# Patient Record
Sex: Female | Born: 1970 | Race: Black or African American | Hispanic: No | Marital: Single | Smoking: Former smoker
Health system: Southern US, Community
[De-identification: ages and names within clinical notes are randomized; demographics above are authoritative.]

## PROBLEM LIST (undated history)

## (undated) DIAGNOSIS — R109 Unspecified abdominal pain: Secondary | ICD-10-CM

## (undated) DIAGNOSIS — D649 Anemia, unspecified: Secondary | ICD-10-CM

## (undated) DIAGNOSIS — I10 Essential (primary) hypertension: Secondary | ICD-10-CM

## (undated) DIAGNOSIS — M1990 Unspecified osteoarthritis, unspecified site: Secondary | ICD-10-CM

## (undated) DIAGNOSIS — G4730 Sleep apnea, unspecified: Secondary | ICD-10-CM

## (undated) DIAGNOSIS — M62830 Muscle spasm of back: Secondary | ICD-10-CM

## (undated) DIAGNOSIS — K635 Polyp of colon: Secondary | ICD-10-CM

## (undated) DIAGNOSIS — Z9889 Other specified postprocedural states: Secondary | ICD-10-CM

## (undated) DIAGNOSIS — Z9289 Personal history of other medical treatment: Secondary | ICD-10-CM

## (undated) DIAGNOSIS — R7303 Prediabetes: Secondary | ICD-10-CM

## (undated) DIAGNOSIS — R7302 Impaired glucose tolerance (oral): Secondary | ICD-10-CM

## (undated) HISTORY — DX: Prediabetes: R73.03

## (undated) HISTORY — DX: Personal history of other medical treatment: Z92.89

## (undated) HISTORY — DX: Polyp of colon: K63.5

## (undated) HISTORY — DX: Unspecified osteoarthritis, unspecified site: M19.90

## (undated) HISTORY — DX: Anemia, unspecified: D64.9

## (undated) HISTORY — DX: Sleep apnea, unspecified: G47.30

## (undated) HISTORY — DX: Muscle spasm of back: M62.830

## (undated) HISTORY — DX: Unspecified abdominal pain: R10.9

## (undated) HISTORY — DX: Essential (primary) hypertension: I10

## (undated) HISTORY — DX: Impaired glucose tolerance (oral): R73.02

## (undated) HISTORY — PX: SALPINGECTOMY: SHX328

## (undated) HISTORY — DX: Other specified postprocedural states: Z98.890

---

## 1999-10-03 ENCOUNTER — Other Ambulatory Visit: Admission: RE | Admit: 1999-10-03 | Discharge: 1999-10-03 | Payer: Self-pay | Admitting: Family Medicine

## 2000-02-06 ENCOUNTER — Emergency Department (HOSPITAL_COMMUNITY): Admission: EM | Admit: 2000-02-06 | Discharge: 2000-02-06 | Payer: Self-pay | Admitting: *Deleted

## 2001-01-29 ENCOUNTER — Emergency Department (HOSPITAL_COMMUNITY): Admission: EM | Admit: 2001-01-29 | Discharge: 2001-01-29 | Payer: Self-pay | Admitting: Emergency Medicine

## 2001-02-17 ENCOUNTER — Other Ambulatory Visit: Admission: RE | Admit: 2001-02-17 | Discharge: 2001-02-17 | Payer: Self-pay | Admitting: Family Medicine

## 2001-07-01 ENCOUNTER — Emergency Department (HOSPITAL_COMMUNITY): Admission: EM | Admit: 2001-07-01 | Discharge: 2001-07-01 | Payer: Self-pay | Admitting: Emergency Medicine

## 2001-07-01 ENCOUNTER — Encounter: Payer: Self-pay | Admitting: Emergency Medicine

## 2001-07-30 ENCOUNTER — Emergency Department (HOSPITAL_COMMUNITY): Admission: EM | Admit: 2001-07-30 | Discharge: 2001-07-30 | Payer: Self-pay | Admitting: Emergency Medicine

## 2001-07-30 ENCOUNTER — Encounter: Payer: Self-pay | Admitting: Emergency Medicine

## 2001-09-09 ENCOUNTER — Ambulatory Visit (HOSPITAL_COMMUNITY): Admission: RE | Admit: 2001-09-09 | Discharge: 2001-09-09 | Payer: Self-pay | Admitting: Family Medicine

## 2001-10-20 ENCOUNTER — Emergency Department (HOSPITAL_COMMUNITY): Admission: EM | Admit: 2001-10-20 | Discharge: 2001-10-20 | Payer: Self-pay | Admitting: Emergency Medicine

## 2002-11-23 ENCOUNTER — Emergency Department (HOSPITAL_COMMUNITY): Admission: EM | Admit: 2002-11-23 | Discharge: 2002-11-23 | Payer: Self-pay

## 2002-11-29 ENCOUNTER — Encounter: Payer: Self-pay | Admitting: Emergency Medicine

## 2002-11-29 ENCOUNTER — Emergency Department (HOSPITAL_COMMUNITY): Admission: EM | Admit: 2002-11-29 | Discharge: 2002-11-29 | Payer: Self-pay | Admitting: *Deleted

## 2004-04-06 ENCOUNTER — Emergency Department (HOSPITAL_COMMUNITY): Admission: EM | Admit: 2004-04-06 | Discharge: 2004-04-06 | Payer: Self-pay | Admitting: Emergency Medicine

## 2004-11-29 ENCOUNTER — Emergency Department (HOSPITAL_COMMUNITY): Admission: EM | Admit: 2004-11-29 | Discharge: 2004-11-29 | Payer: Self-pay | Admitting: Emergency Medicine

## 2004-11-29 IMAGING — CR DG CHEST 2V
2 series · 2 of 2 positions shown · non-contrast
Comparison: None available.

CLINICAL DATA: Chest pain. 
 CHEST - 2 VIEWS:

[view not recorded (1 of 2)]
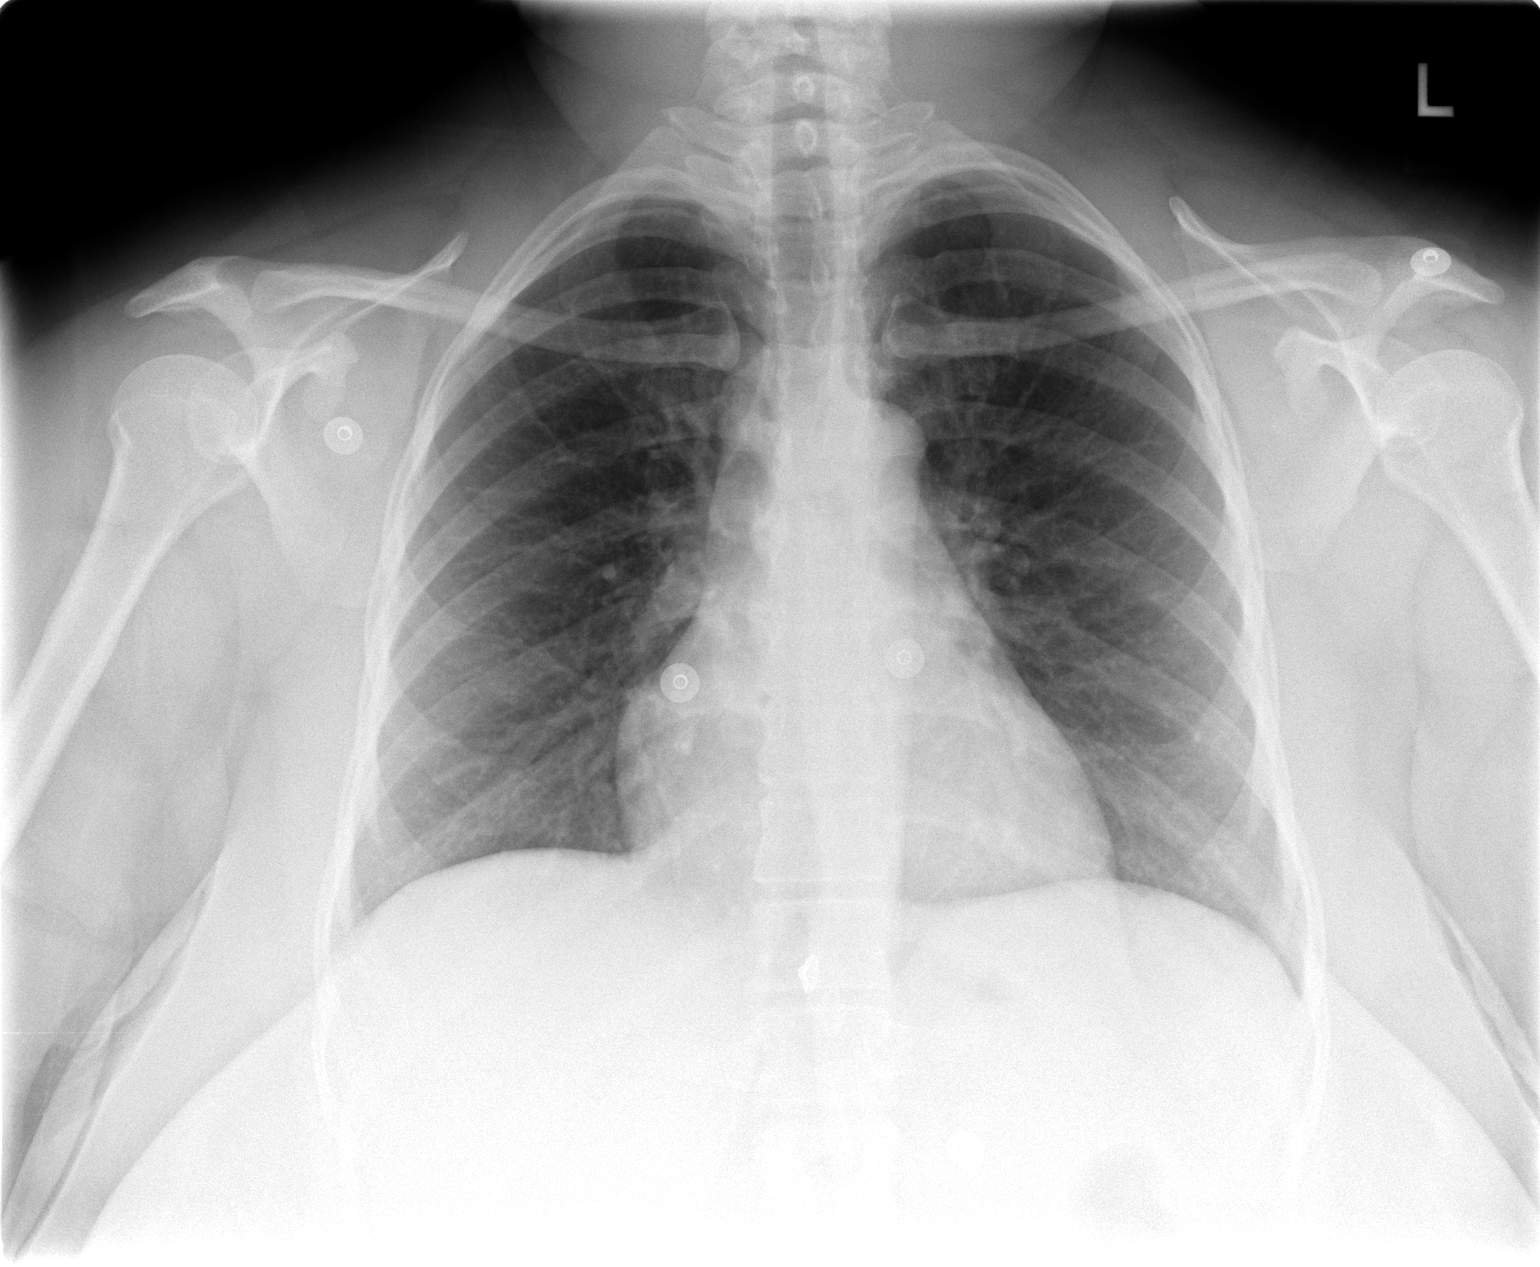

[view not recorded (2 of 2)]
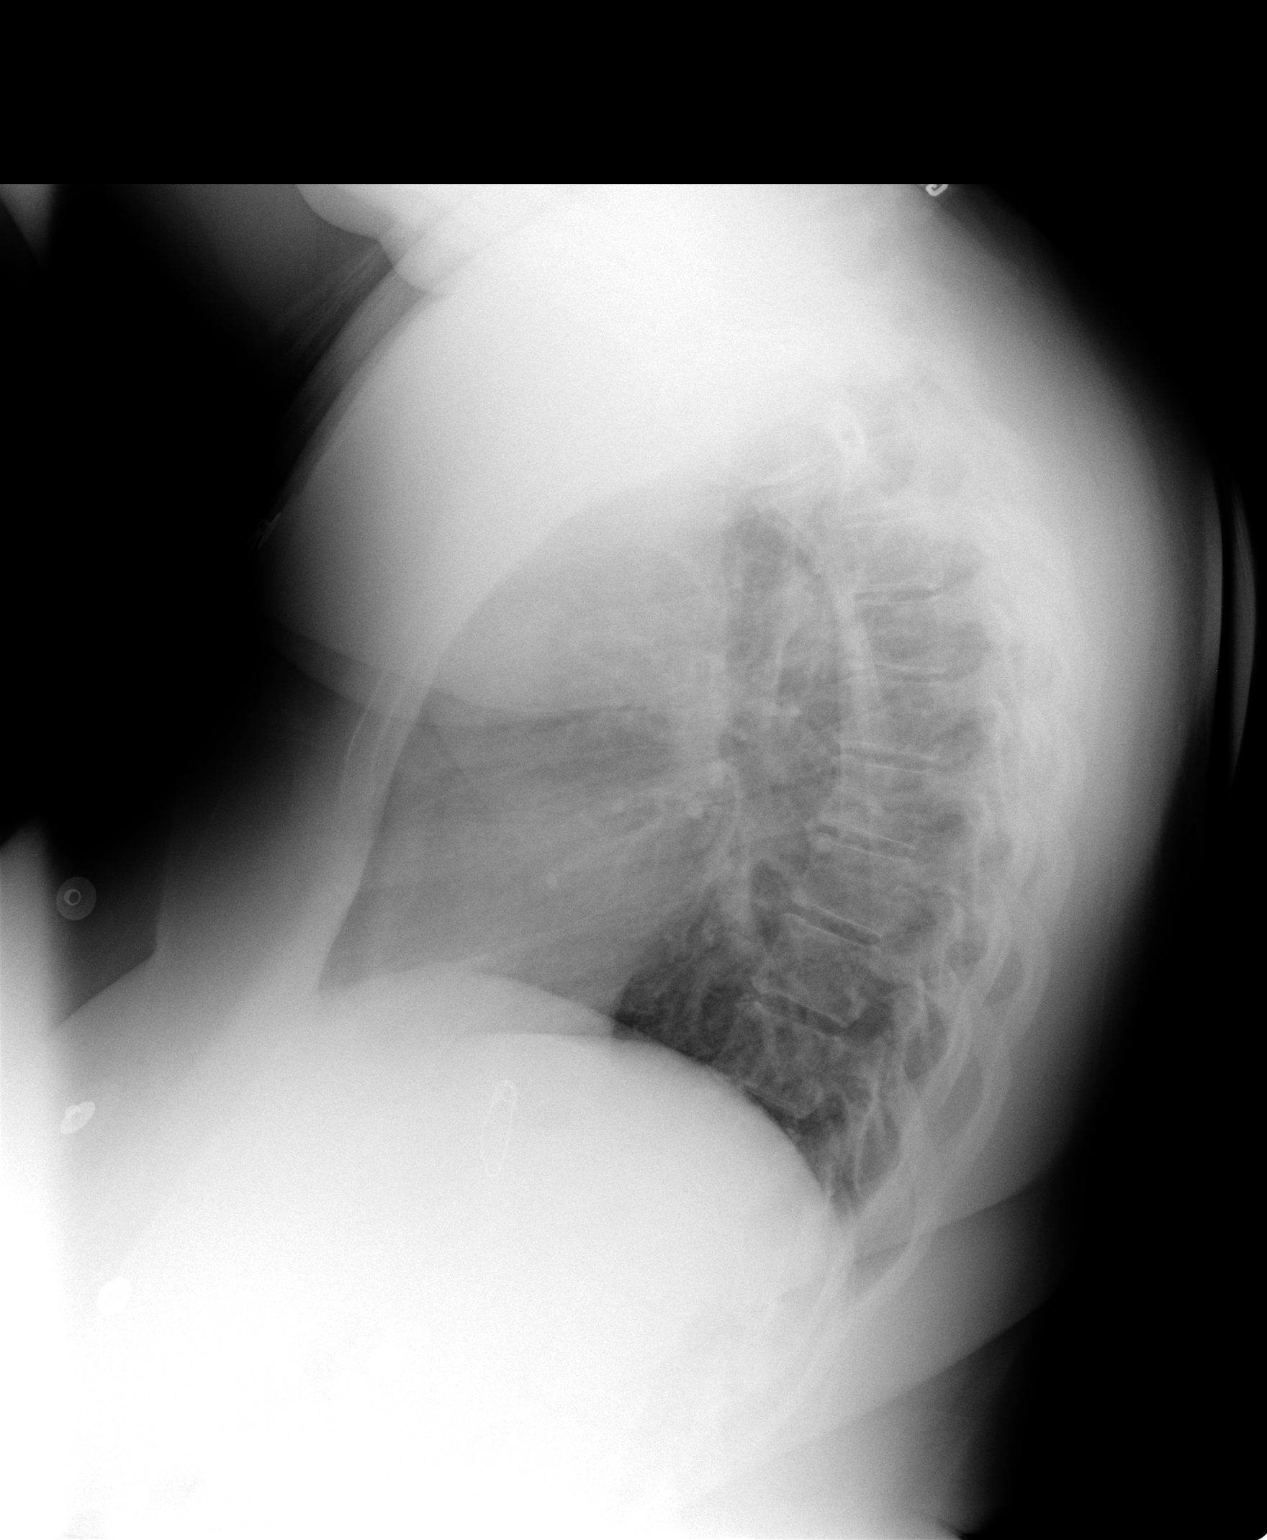

[2 of 2 positions shown; findings below may reference images not displayed]

Upper limits normal heart size is noted.  Lungs are clear.  No pleural effusions or pneumothorax.  Mild thoracic kyphosis noted.
IMPRESSION: Upper limits normal heart size without evidence of acute cardiopulmonary disease.

## 2005-03-17 IMAGING — CR DG CHEST 2V
2 series · 2 of 2 positions shown · non-contrast
Comparison: [DATE].

CLINICAL DATA: Emergency Department patient with chest pain for a couple of days.  Hypertension.  Previous smoker.  Congestion.
 CHEST - 2 VIEWS:

[w chest pa]
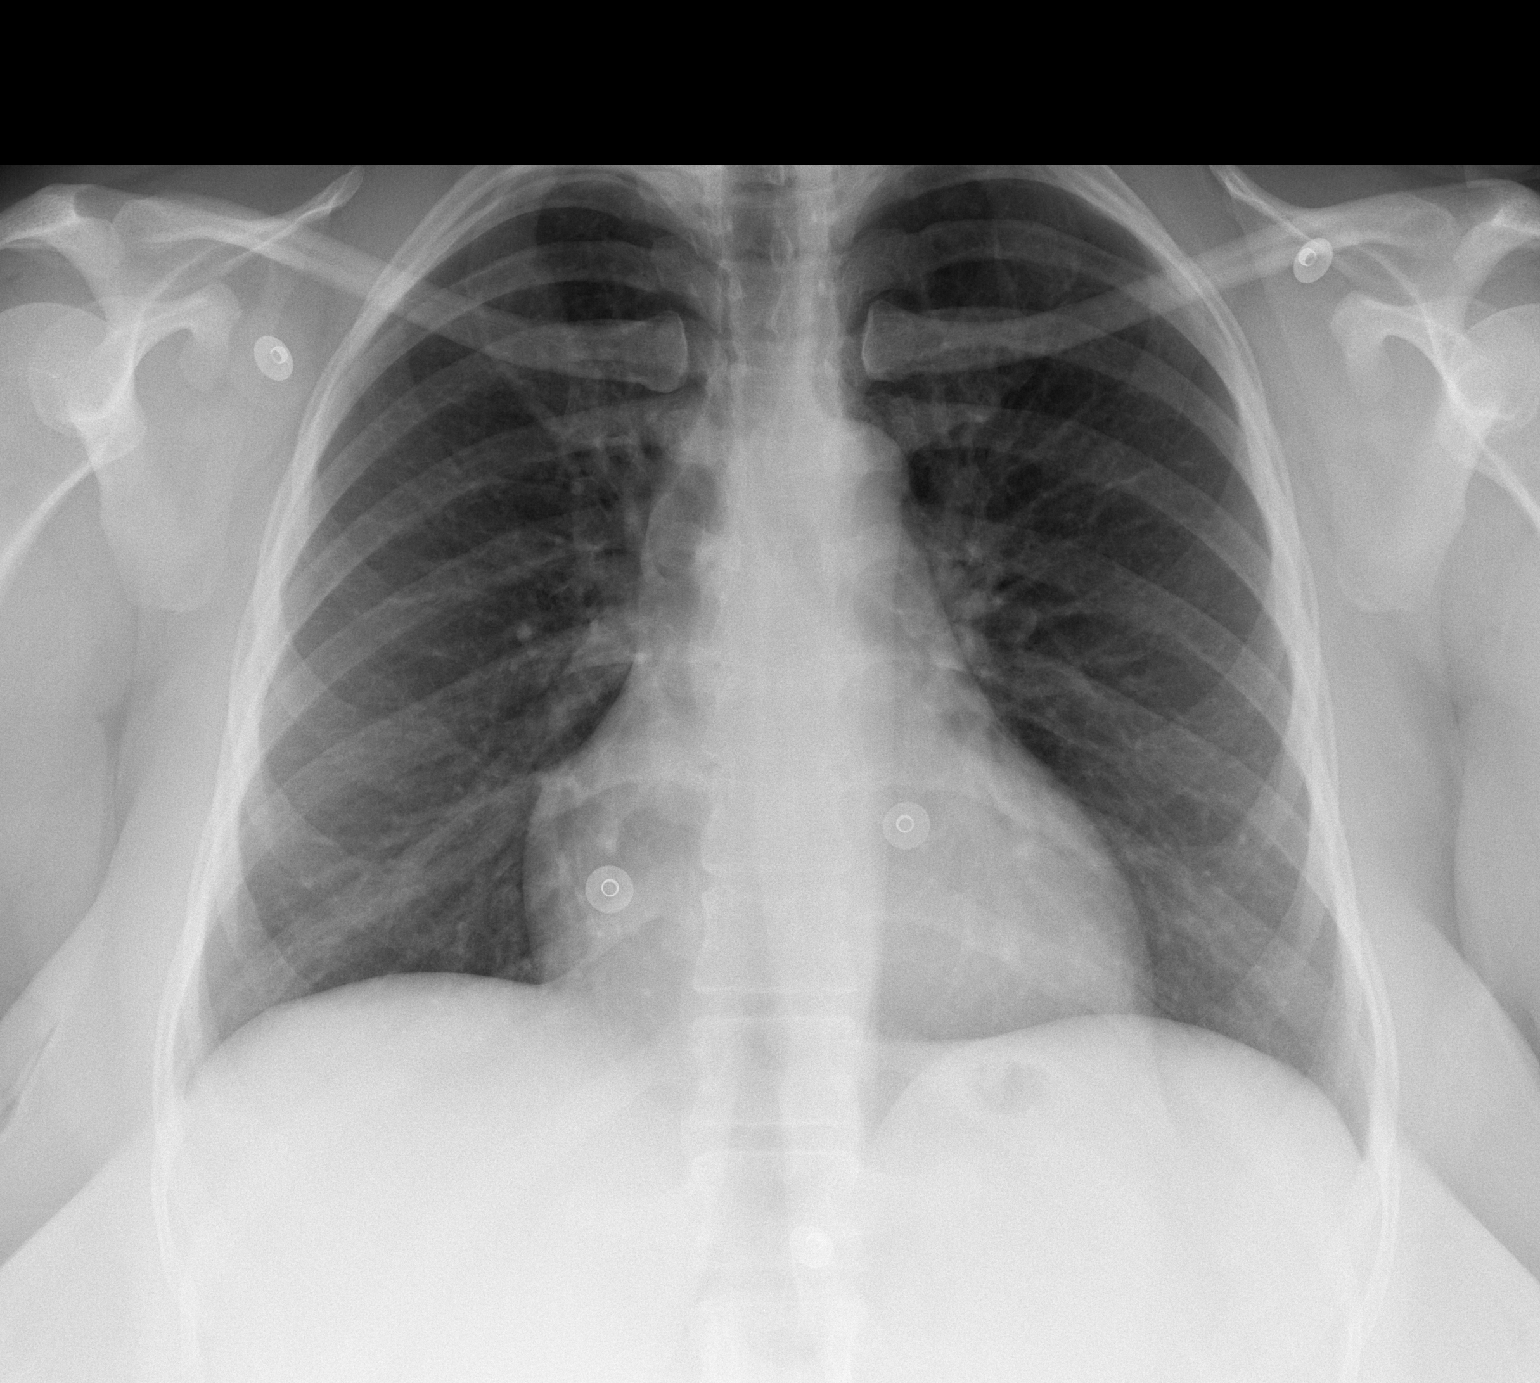

[w chest lat]
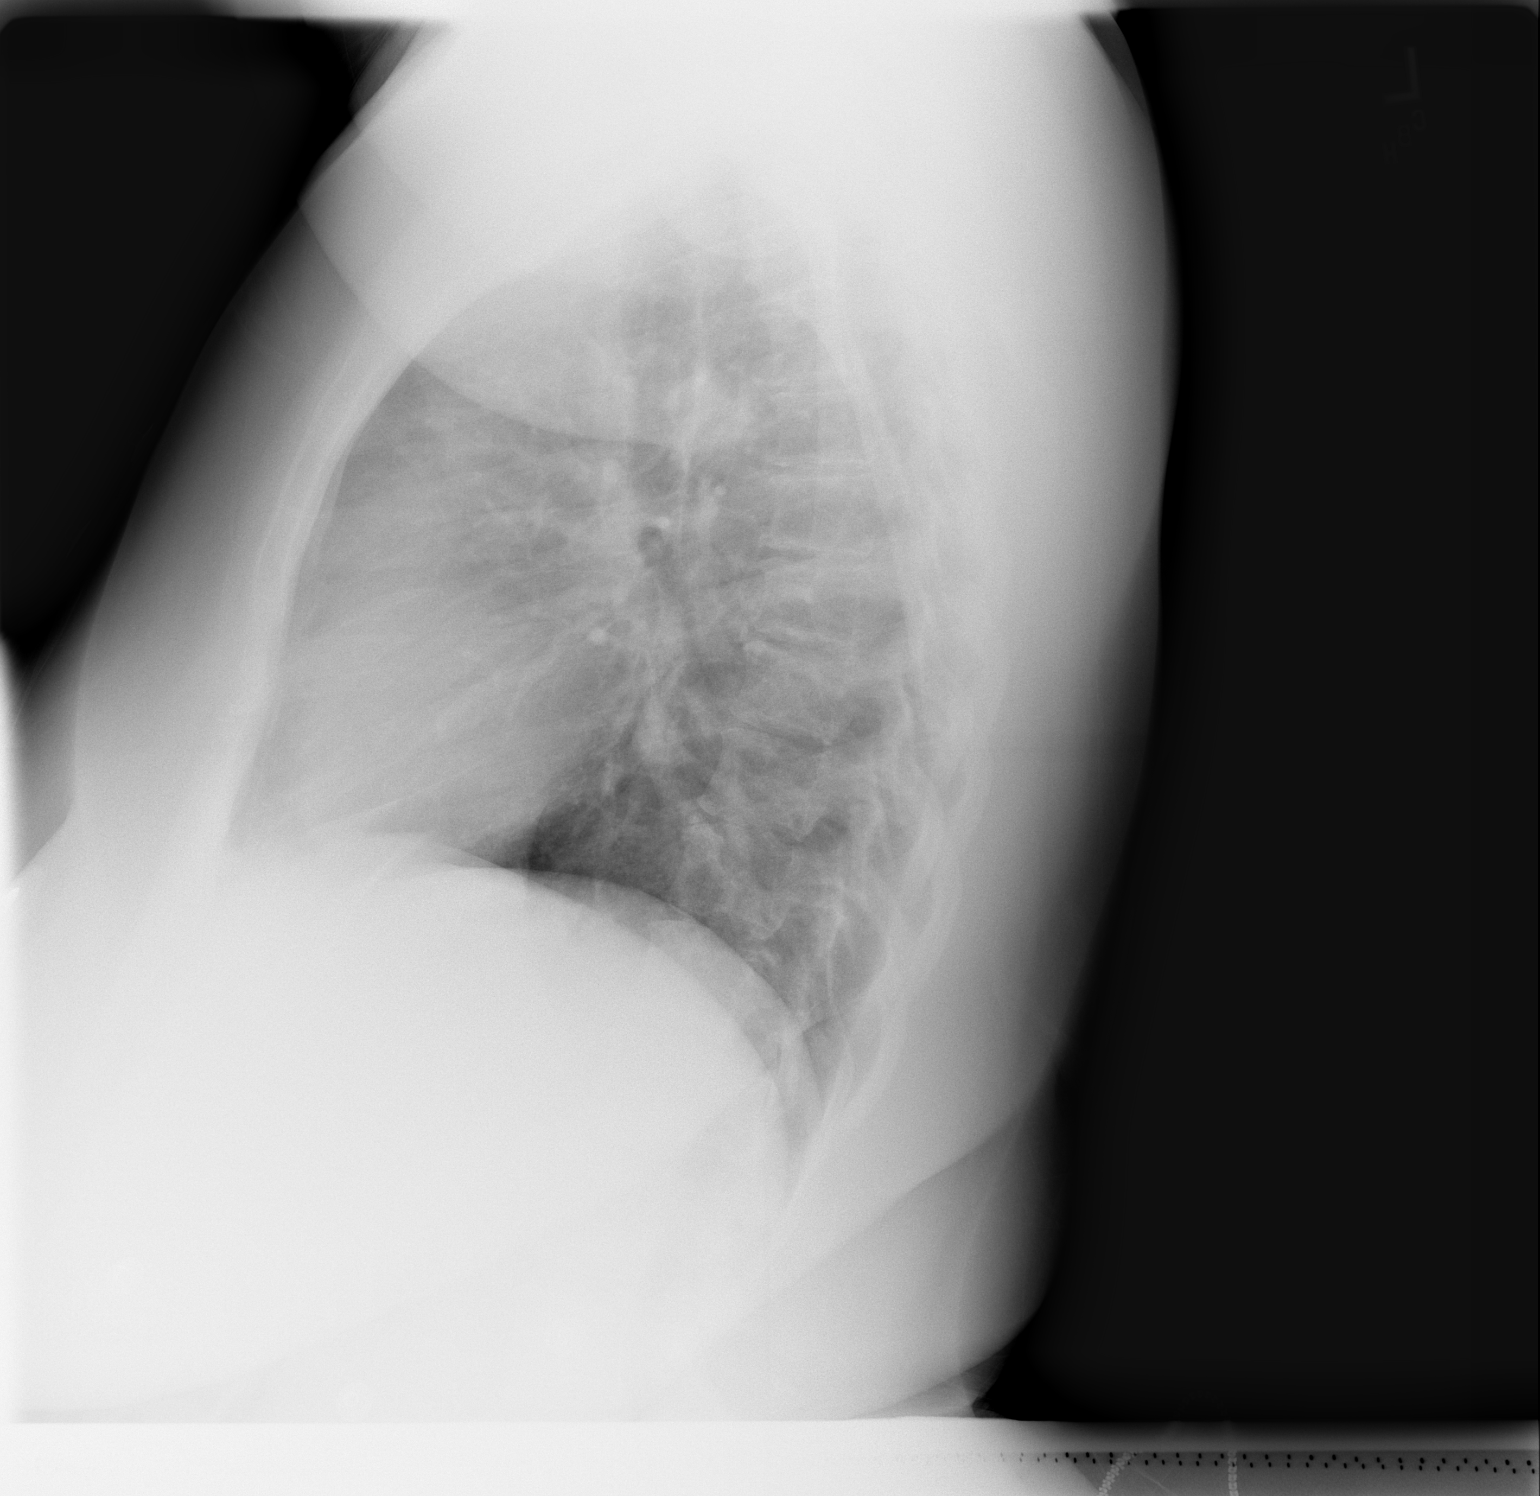

[2 of 2 positions shown; findings below may reference images not displayed]

FINDINGS: The cardiac size is upper normal.  There is no pulmonary vascular congestion or active lung process.  Intact bony thorax.
IMPRESSION: Minimal prominence of cardiac size.  No acute pulmonary abnormality.  No congestive heart failure.

## 2005-04-12 ENCOUNTER — Encounter (INDEPENDENT_AMBULATORY_CARE_PROVIDER_SITE_OTHER): Payer: Self-pay | Admitting: *Deleted

## 2005-04-12 LAB — CONVERTED CEMR LAB

## 2005-04-23 ENCOUNTER — Encounter: Payer: Self-pay | Admitting: Family Medicine

## 2005-04-23 ENCOUNTER — Ambulatory Visit (HOSPITAL_COMMUNITY): Admission: RE | Admit: 2005-04-23 | Discharge: 2005-04-23 | Payer: Self-pay | Admitting: Family Medicine

## 2005-04-23 ENCOUNTER — Ambulatory Visit: Payer: Self-pay | Admitting: Family Medicine

## 2005-05-28 ENCOUNTER — Ambulatory Visit: Payer: Self-pay | Admitting: Family Medicine

## 2005-06-24 ENCOUNTER — Emergency Department (HOSPITAL_COMMUNITY): Admission: EM | Admit: 2005-06-24 | Discharge: 2005-06-24 | Payer: Self-pay | Admitting: Emergency Medicine

## 2005-06-24 IMAGING — CR DG HAND COMPLETE 3+V*L*
3 series · 3 of 3 positions shown · non-contrast
Comparison: none

CLINICAL DATA: Trauma.  Pain over the third to fifth metacarpals. 
 LEFT HAND ? 3 VIEWS:
 There is no evidence of fracture or dislocation.  There is no evidence of arthropathy or other focal bone abnormality.  Soft tissues are unremarkable.

[x hand pa left]
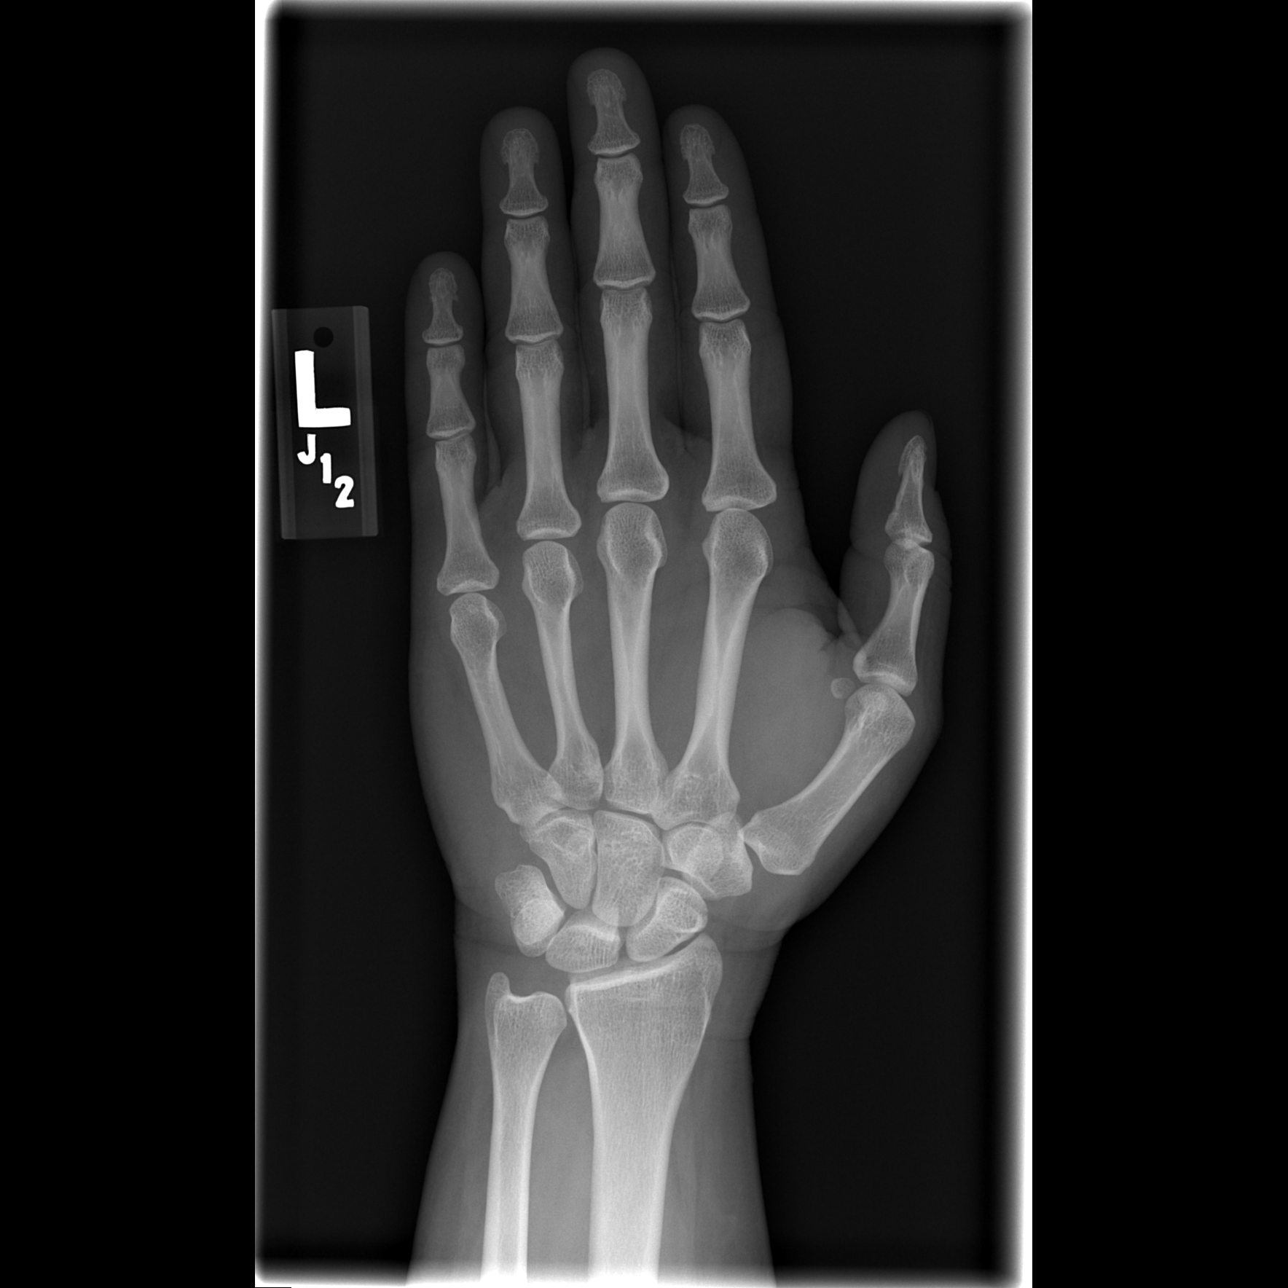

[x hand oblique left]
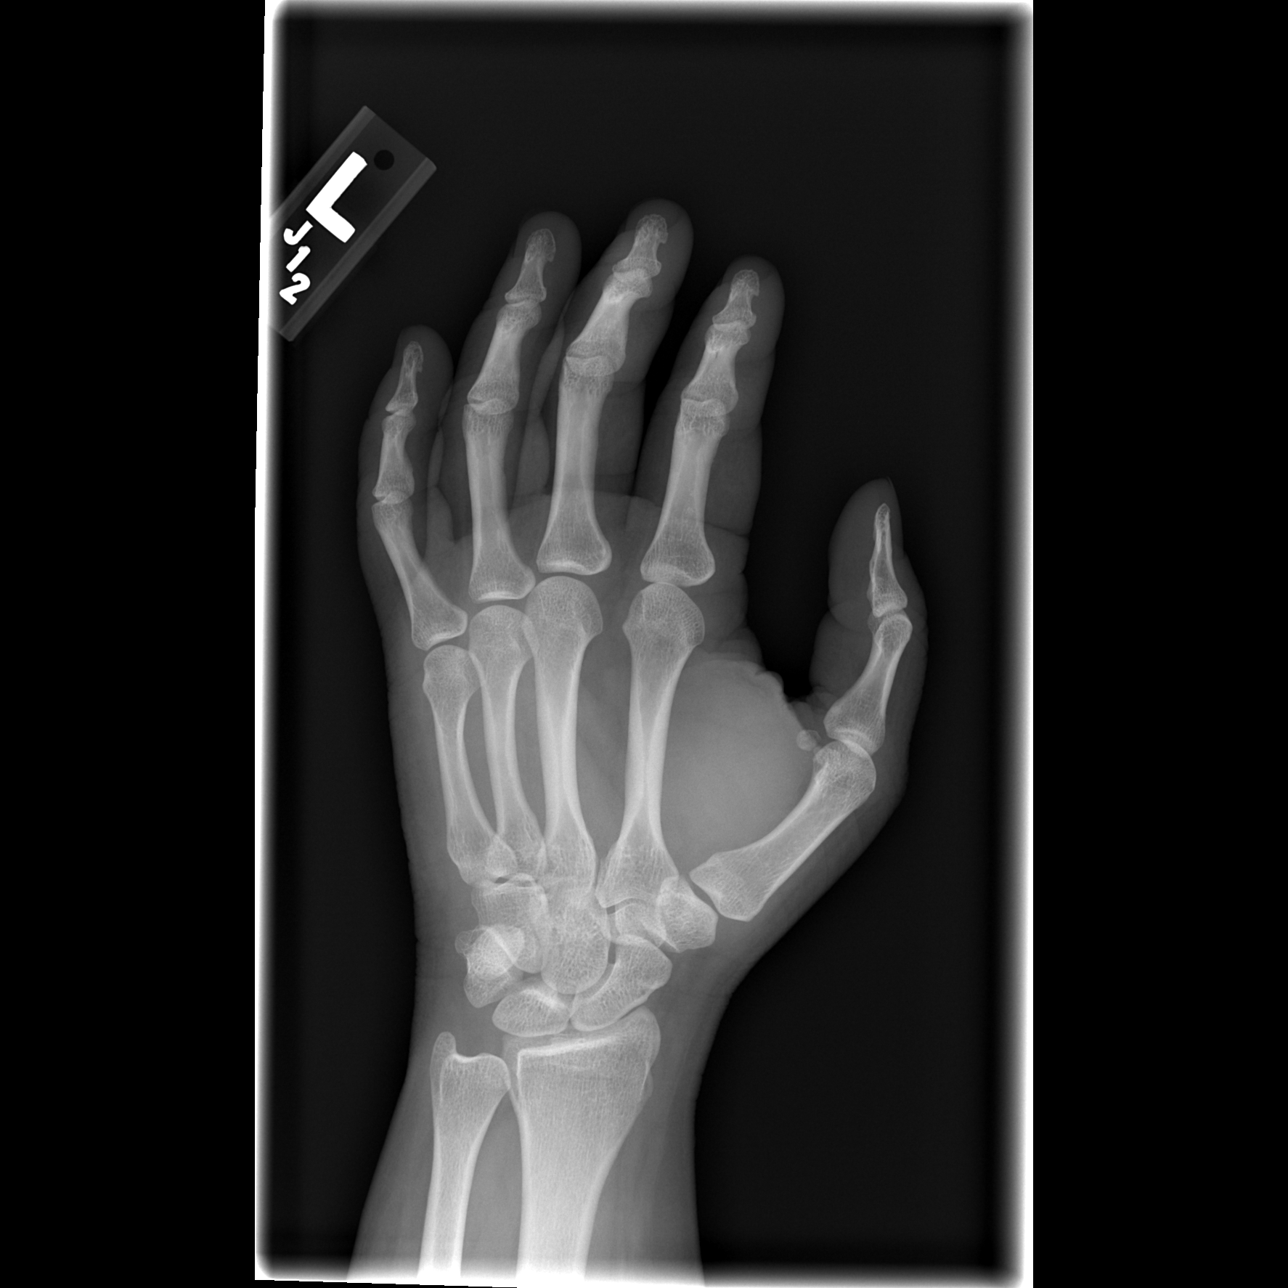

[x hand lat left]
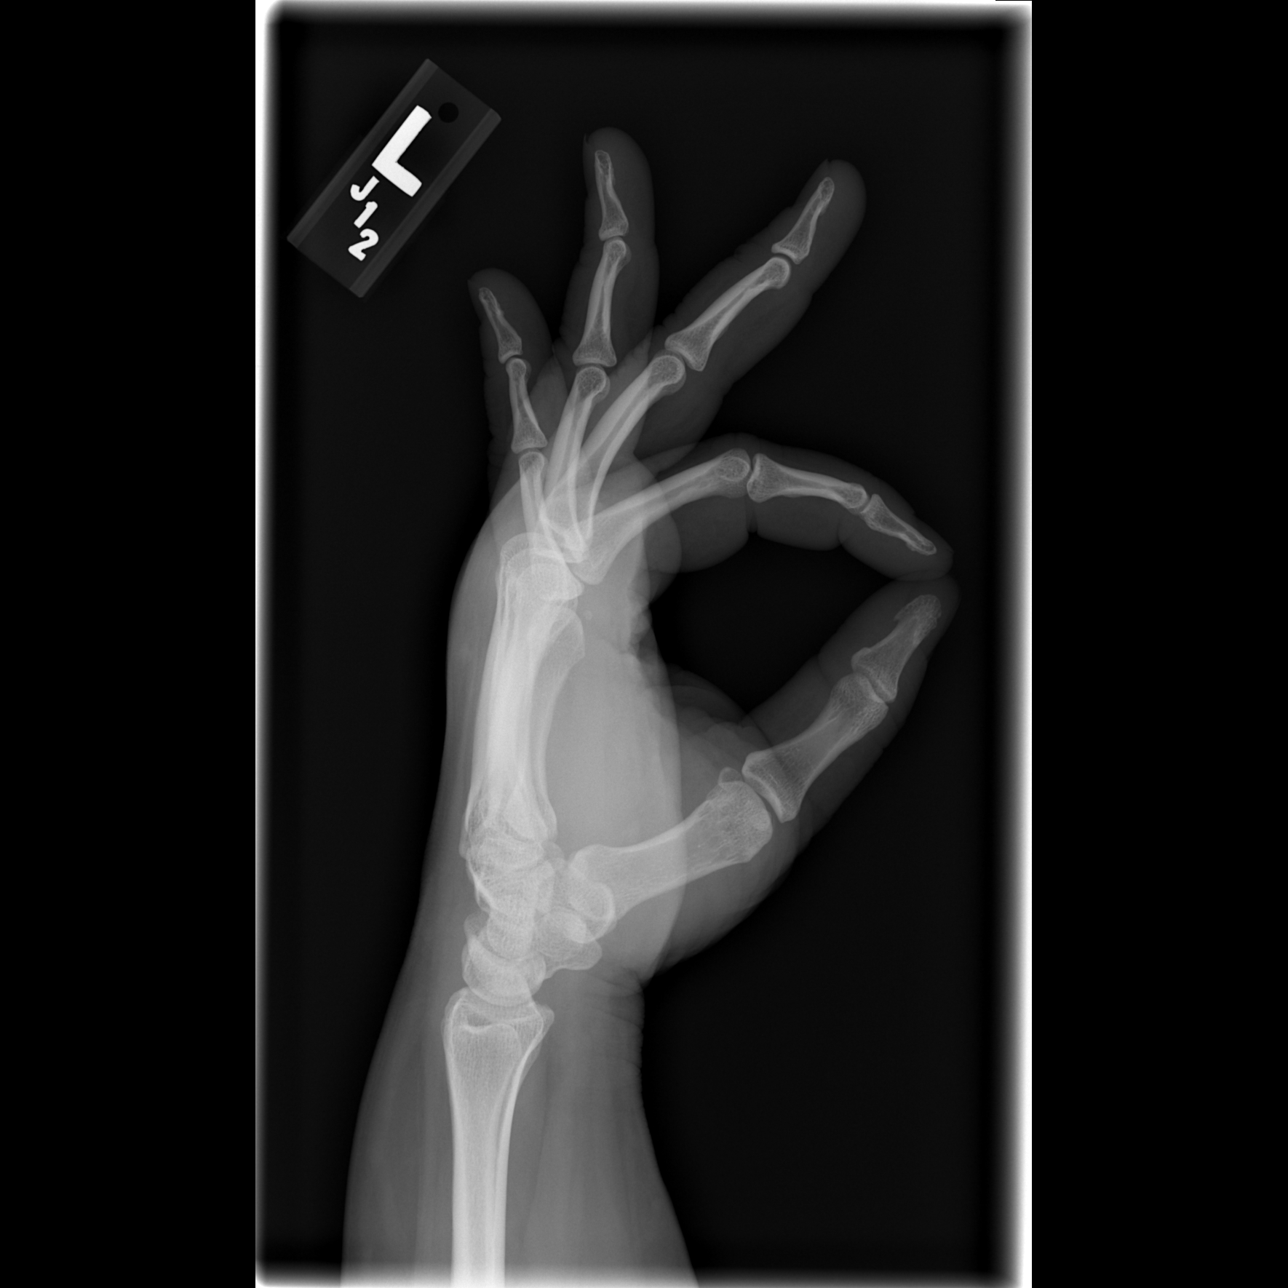

[3 of 3 positions shown; findings below may reference images not displayed]

IMPRESSION: Negative.

## 2005-07-30 ENCOUNTER — Ambulatory Visit: Payer: Self-pay | Admitting: Family Medicine

## 2005-08-02 ENCOUNTER — Emergency Department (HOSPITAL_COMMUNITY): Admission: EM | Admit: 2005-08-02 | Discharge: 2005-08-02 | Payer: Self-pay | Admitting: Emergency Medicine

## 2005-08-02 IMAGING — CR DG CHEST 2V
2 series · 2 of 2 positions shown · non-contrast
Comparison: [DATE].

CLINICAL DATA: Chest tightness/shortness of breath.
 CHEST ? 2 VIEW:

[view not recorded (1 of 2)]
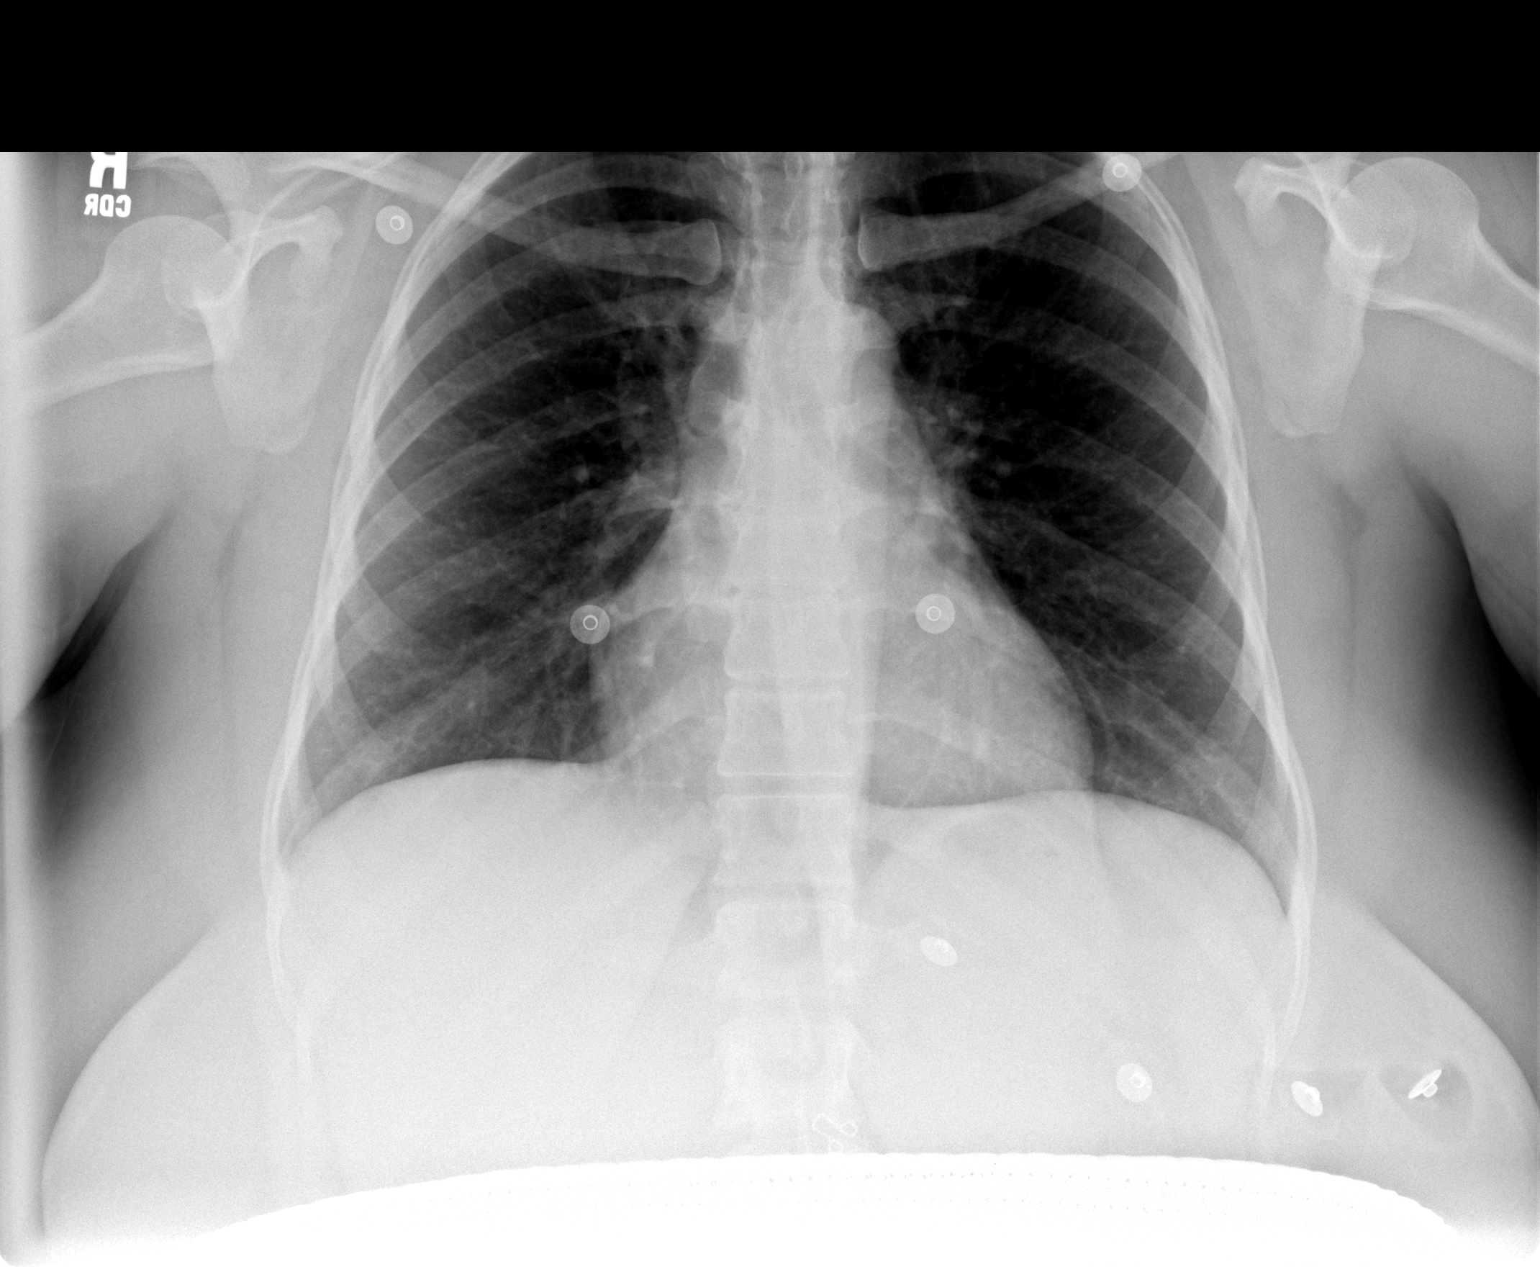

[view not recorded (2 of 2)]
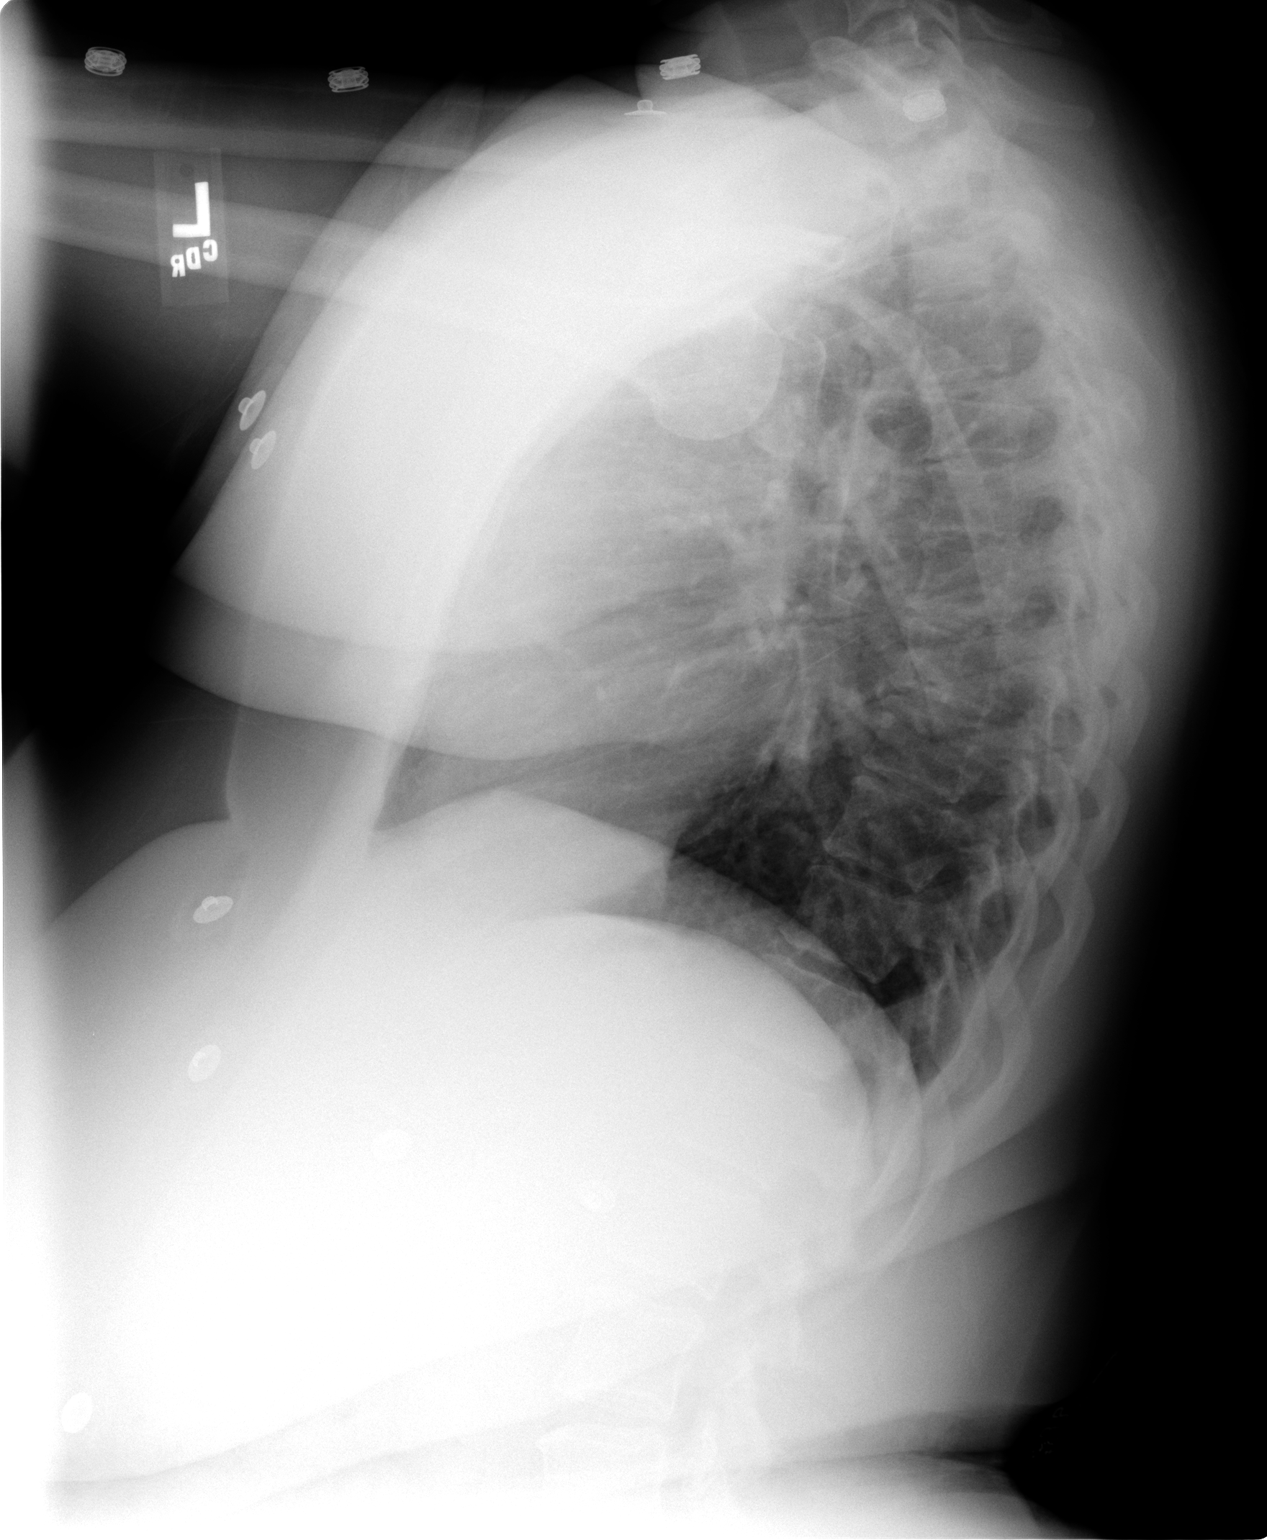

[2 of 2 positions shown; findings below may reference images not displayed]

FINDINGS: Abdomen and pelvis were shielded.
 The heart size and mediastinal contours are within normal limits.  Both lungs are clear.  The visualized skeletal structures are unremarkable.
IMPRESSION: No active cardiopulmonary disease.

## 2006-06-19 ENCOUNTER — Emergency Department (HOSPITAL_COMMUNITY): Admission: EM | Admit: 2006-06-19 | Discharge: 2006-06-19 | Payer: Self-pay | Admitting: Emergency Medicine

## 2006-06-23 ENCOUNTER — Ambulatory Visit: Payer: Self-pay | Admitting: Sports Medicine

## 2006-07-11 ENCOUNTER — Ambulatory Visit: Payer: Self-pay | Admitting: Family Medicine

## 2006-08-12 DIAGNOSIS — Z9289 Personal history of other medical treatment: Secondary | ICD-10-CM

## 2006-08-12 HISTORY — DX: Personal history of other medical treatment: Z92.89

## 2006-08-22 ENCOUNTER — Ambulatory Visit: Payer: Self-pay | Admitting: Sports Medicine

## 2006-08-22 ENCOUNTER — Ambulatory Visit (HOSPITAL_COMMUNITY): Admission: RE | Admit: 2006-08-22 | Discharge: 2006-08-22 | Payer: Self-pay | Admitting: Family Medicine

## 2006-08-29 ENCOUNTER — Encounter: Payer: Self-pay | Admitting: Family Medicine

## 2006-08-29 ENCOUNTER — Ambulatory Visit: Payer: Self-pay | Admitting: Family Medicine

## 2006-08-29 LAB — CONVERTED CEMR LAB
Creatinine, Ser: 0.67 mg/dL (ref 0.40–1.20)
HDL: 39 mg/dL — ABNORMAL LOW (ref >39)
LDL Cholesterol: 126 mg/dL — ABNORMAL HIGH (ref 0–99)
Potassium: 4.3 meq/L (ref 3.5–5.3)
Sodium: 138 meq/L (ref 135–145)
Total CHOL/HDL Ratio: 4.7
VLDL: 19 mg/dL (ref 0–40)

## 2006-09-02 ENCOUNTER — Ambulatory Visit: Payer: Self-pay | Admitting: Family Medicine

## 2006-09-02 LAB — CONVERTED CEMR LAB: Iron: 74 ug/dL (ref 42–145)

## 2006-10-09 DIAGNOSIS — I10 Essential (primary) hypertension: Secondary | ICD-10-CM | POA: Insufficient documentation

## 2006-10-10 ENCOUNTER — Encounter (INDEPENDENT_AMBULATORY_CARE_PROVIDER_SITE_OTHER): Payer: Self-pay | Admitting: *Deleted

## 2006-11-12 ENCOUNTER — Encounter: Payer: Self-pay | Admitting: Family Medicine

## 2006-11-18 ENCOUNTER — Ambulatory Visit: Payer: Self-pay | Admitting: Family Medicine

## 2006-11-18 DIAGNOSIS — D509 Iron deficiency anemia, unspecified: Secondary | ICD-10-CM | POA: Insufficient documentation

## 2006-11-18 HISTORY — DX: Iron deficiency anemia, unspecified: D50.9

## 2007-01-27 ENCOUNTER — Ambulatory Visit: Payer: Self-pay | Admitting: Family Medicine

## 2007-01-27 DIAGNOSIS — M2550 Pain in unspecified joint: Secondary | ICD-10-CM

## 2007-01-27 HISTORY — DX: Pain in unspecified joint: M25.50

## 2007-01-27 LAB — CONVERTED CEMR LAB
HCT: 36.3 %
Hemoglobin: 12.3 g/dL
WBC: 6.2 10*3/uL

## 2007-01-28 ENCOUNTER — Encounter: Payer: Self-pay | Admitting: Family Medicine

## 2007-03-24 ENCOUNTER — Encounter: Payer: Self-pay | Admitting: Family Medicine

## 2007-04-07 ENCOUNTER — Ambulatory Visit: Payer: Self-pay | Admitting: Family Medicine

## 2007-06-16 ENCOUNTER — Encounter (INDEPENDENT_AMBULATORY_CARE_PROVIDER_SITE_OTHER): Payer: Self-pay | Admitting: *Deleted

## 2007-08-18 ENCOUNTER — Ambulatory Visit: Payer: Self-pay | Admitting: Family Medicine

## 2007-08-19 ENCOUNTER — Ambulatory Visit: Payer: Self-pay | Admitting: Family Medicine

## 2007-08-19 LAB — CONVERTED CEMR LAB
Albumin: 4.3 g/dL (ref 3.5–5.2)
Alkaline Phosphatase: 69 units/L (ref 39–117)
BUN: 13 mg/dL (ref 6–23)
CO2: 20 meq/L (ref 19–32)
Calcium: 8.8 mg/dL (ref 8.4–10.5)
Chloride: 106 meq/L (ref 96–112)
Creatinine, Ser: 0.66 mg/dL (ref 0.40–1.20)
HDL: 41 mg/dL (ref >39)
LDL Cholesterol: 115 mg/dL — ABNORMAL HIGH (ref 0–99)
Platelets: 240 10*3/uL (ref 150–400)
RDW: 14.7 % (ref 11.5–15.5)
Sodium: 138 meq/L (ref 135–145)
Total CHOL/HDL Ratio: 4.4
Total Protein: 6.9 g/dL (ref 6.0–8.3)
VLDL: 23 mg/dL (ref 0–40)

## 2007-08-20 ENCOUNTER — Encounter: Payer: Self-pay | Admitting: Family Medicine

## 2007-09-10 ENCOUNTER — Telehealth: Payer: Self-pay | Admitting: *Deleted

## 2007-09-10 ENCOUNTER — Ambulatory Visit: Payer: Self-pay | Admitting: Internal Medicine

## 2007-09-29 ENCOUNTER — Telehealth: Payer: Self-pay | Admitting: *Deleted

## 2007-09-30 ENCOUNTER — Ambulatory Visit: Payer: Self-pay | Admitting: Family Medicine

## 2007-10-06 ENCOUNTER — Ambulatory Visit: Payer: Self-pay | Admitting: Family Medicine

## 2007-10-06 ENCOUNTER — Encounter: Payer: Self-pay | Admitting: Family Medicine

## 2007-10-07 ENCOUNTER — Encounter: Payer: Self-pay | Admitting: Family Medicine

## 2007-10-07 LAB — CONVERTED CEMR LAB: Pap Smear: NORMAL

## 2007-10-09 ENCOUNTER — Encounter: Payer: Self-pay | Admitting: Family Medicine

## 2007-12-04 ENCOUNTER — Telehealth: Payer: Self-pay | Admitting: *Deleted

## 2007-12-07 ENCOUNTER — Ambulatory Visit: Payer: Self-pay | Admitting: Family Medicine

## 2007-12-07 ENCOUNTER — Encounter (INDEPENDENT_AMBULATORY_CARE_PROVIDER_SITE_OTHER): Payer: Self-pay | Admitting: Family Medicine

## 2007-12-07 LAB — CONVERTED CEMR LAB
Bilirubin Urine: NEGATIVE
Glucose, Urine, Semiquant: NEGATIVE
Ketones, urine, test strip: NEGATIVE
Protein, U semiquant: NEGATIVE
WBC Urine, dipstick: NEGATIVE

## 2007-12-09 ENCOUNTER — Encounter (INDEPENDENT_AMBULATORY_CARE_PROVIDER_SITE_OTHER): Payer: Self-pay | Admitting: Family Medicine

## 2007-12-09 LAB — CONVERTED CEMR LAB
ALT: 23 units/L (ref 0–35)
BUN: 16 mg/dL (ref 6–23)
CO2: 21 meq/L (ref 19–32)
Calcium: 9.6 mg/dL (ref 8.4–10.5)
Creatinine, Ser: 0.76 mg/dL (ref 0.40–1.20)
Glucose, Bld: 108 mg/dL — ABNORMAL HIGH (ref 70–99)
MCHC: 30.9 g/dL (ref 30.0–36.0)
MCV: 84.4 fL (ref 78.0–100.0)
Platelets: 307 10*3/uL (ref 150–400)
Sodium: 139 meq/L (ref 135–145)
Total Protein: 7.7 g/dL (ref 6.0–8.3)

## 2007-12-11 ENCOUNTER — Encounter: Payer: Self-pay | Admitting: *Deleted

## 2007-12-31 ENCOUNTER — Encounter: Payer: Self-pay | Admitting: *Deleted

## 2008-01-12 ENCOUNTER — Ambulatory Visit: Payer: Self-pay | Admitting: Family Medicine

## 2008-01-26 ENCOUNTER — Encounter: Payer: Self-pay | Admitting: Family Medicine

## 2008-01-26 ENCOUNTER — Ambulatory Visit: Payer: Self-pay | Admitting: Family Medicine

## 2008-01-26 LAB — CONVERTED CEMR LAB
HCT: 36.3 % (ref 36.0–46.0)
Hemoglobin: 11.2 g/dL — ABNORMAL LOW (ref 12.0–15.0)
MCV: 84.4 fL (ref 78.0–100.0)
Platelets: 315 10*3/uL (ref 150–400)
Saturation Ratios: 16 % — ABNORMAL LOW (ref 20–55)

## 2008-01-28 ENCOUNTER — Encounter: Payer: Self-pay | Admitting: Family Medicine

## 2008-02-02 ENCOUNTER — Telehealth (INDEPENDENT_AMBULATORY_CARE_PROVIDER_SITE_OTHER): Payer: Self-pay | Admitting: *Deleted

## 2008-02-10 ENCOUNTER — Ambulatory Visit: Payer: Self-pay | Admitting: Family Medicine

## 2008-02-10 ENCOUNTER — Encounter: Payer: Self-pay | Admitting: *Deleted

## 2008-02-16 ENCOUNTER — Encounter: Payer: Self-pay | Admitting: Family Medicine

## 2008-02-17 ENCOUNTER — Telehealth: Payer: Self-pay | Admitting: *Deleted

## 2008-02-18 ENCOUNTER — Encounter (INDEPENDENT_AMBULATORY_CARE_PROVIDER_SITE_OTHER): Payer: Self-pay | Admitting: Obstetrics and Gynecology

## 2008-02-18 ENCOUNTER — Ambulatory Visit (HOSPITAL_COMMUNITY): Admission: RE | Admit: 2008-02-18 | Discharge: 2008-02-18 | Payer: Self-pay | Admitting: Obstetrics and Gynecology

## 2008-04-04 ENCOUNTER — Encounter: Payer: Self-pay | Admitting: Family Medicine

## 2008-04-04 ENCOUNTER — Telehealth: Payer: Self-pay | Admitting: *Deleted

## 2008-04-04 ENCOUNTER — Ambulatory Visit: Payer: Self-pay | Admitting: Sports Medicine

## 2008-04-05 ENCOUNTER — Telehealth (INDEPENDENT_AMBULATORY_CARE_PROVIDER_SITE_OTHER): Payer: Self-pay | Admitting: *Deleted

## 2008-04-05 ENCOUNTER — Emergency Department (HOSPITAL_COMMUNITY): Admission: EM | Admit: 2008-04-05 | Discharge: 2008-04-05 | Payer: Self-pay | Admitting: Emergency Medicine

## 2008-04-07 ENCOUNTER — Ambulatory Visit: Payer: Self-pay | Admitting: Family Medicine

## 2008-04-13 ENCOUNTER — Encounter: Payer: Self-pay | Admitting: Family Medicine

## 2008-04-26 ENCOUNTER — Telehealth: Payer: Self-pay | Admitting: Family Medicine

## 2008-04-26 DIAGNOSIS — M5412 Radiculopathy, cervical region: Secondary | ICD-10-CM

## 2008-04-26 HISTORY — DX: Radiculopathy, cervical region: M54.12

## 2008-09-06 ENCOUNTER — Ambulatory Visit: Payer: Self-pay | Admitting: Family Medicine

## 2008-09-06 LAB — CONVERTED CEMR LAB
Albumin: 4.7 g/dL (ref 3.5–5.2)
BUN: 13 mg/dL (ref 6–23)
Bilirubin Urine: NEGATIVE
Blood in Urine, dipstick: NEGATIVE
CO2: 24 meq/L (ref 19–32)
Calcium: 9.6 mg/dL (ref 8.4–10.5)
Chloride: 101 meq/L (ref 96–112)
Creatinine, Ser: 0.64 mg/dL (ref 0.40–1.20)
Glucose, Bld: 90 mg/dL (ref 70–99)
Iron: 49 ug/dL (ref 42–145)
Ketones, urine, test strip: NEGATIVE
MCV: 84.4 fL (ref 78.0–100.0)
Platelets: 308 10*3/uL (ref 150–400)
Potassium: 4 meq/L (ref 3.5–5.3)
Specific Gravity, Urine: 1.02
Total Bilirubin: 0.4 mg/dL (ref 0.3–1.2)
UIBC: 308 ug/dL
Urobilinogen, UA: 0.2
WBC Urine, dipstick: NEGATIVE
WBC: 5.9 10*3/uL (ref 4.0–10.5)
pH: 6

## 2008-09-07 ENCOUNTER — Encounter: Payer: Self-pay | Admitting: Family Medicine

## 2008-10-12 ENCOUNTER — Ambulatory Visit: Payer: Self-pay | Admitting: Gastroenterology

## 2008-10-12 DIAGNOSIS — Z8679 Personal history of other diseases of the circulatory system: Secondary | ICD-10-CM | POA: Insufficient documentation

## 2008-10-18 ENCOUNTER — Ambulatory Visit: Payer: Self-pay | Admitting: Gastroenterology

## 2008-10-18 LAB — CONVERTED CEMR LAB: Fecal Occult Bld: POSITIVE — AB

## 2008-10-20 ENCOUNTER — Telehealth: Payer: Self-pay | Admitting: Gastroenterology

## 2008-10-24 ENCOUNTER — Ambulatory Visit: Payer: Self-pay | Admitting: Gastroenterology

## 2008-10-27 ENCOUNTER — Ambulatory Visit: Payer: Self-pay | Admitting: Gastroenterology

## 2008-10-27 ENCOUNTER — Encounter: Payer: Self-pay | Admitting: Gastroenterology

## 2008-10-27 DIAGNOSIS — Z9889 Other specified postprocedural states: Secondary | ICD-10-CM

## 2008-10-27 HISTORY — DX: Other specified postprocedural states: Z98.890

## 2008-10-28 ENCOUNTER — Encounter: Payer: Self-pay | Admitting: Gastroenterology

## 2008-11-17 IMAGING — CR DG LUMBAR SPINE COMPLETE 4+V
5 series · 5 of 5 positions shown · non-contrast
Comparison: None.

CLINICAL DATA: Low back pain.

LUMBAR SPINE - COMPLETE 4+ VIEW

[t l-spine a.p.]
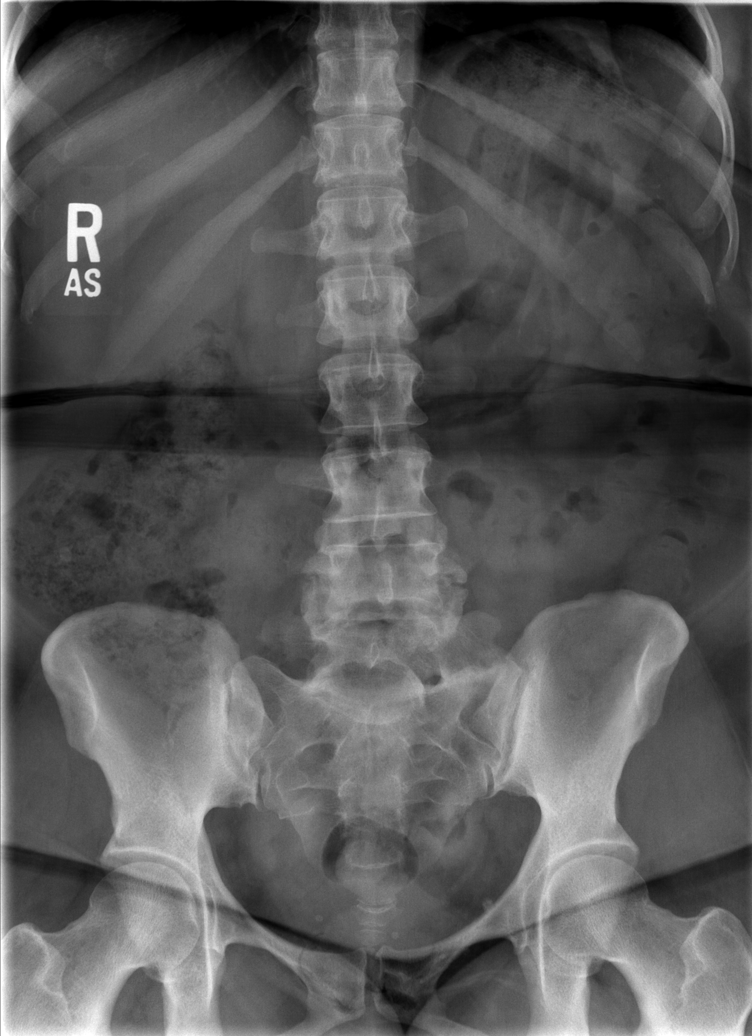

[t l-spine oblique exposure (1 of 2)]
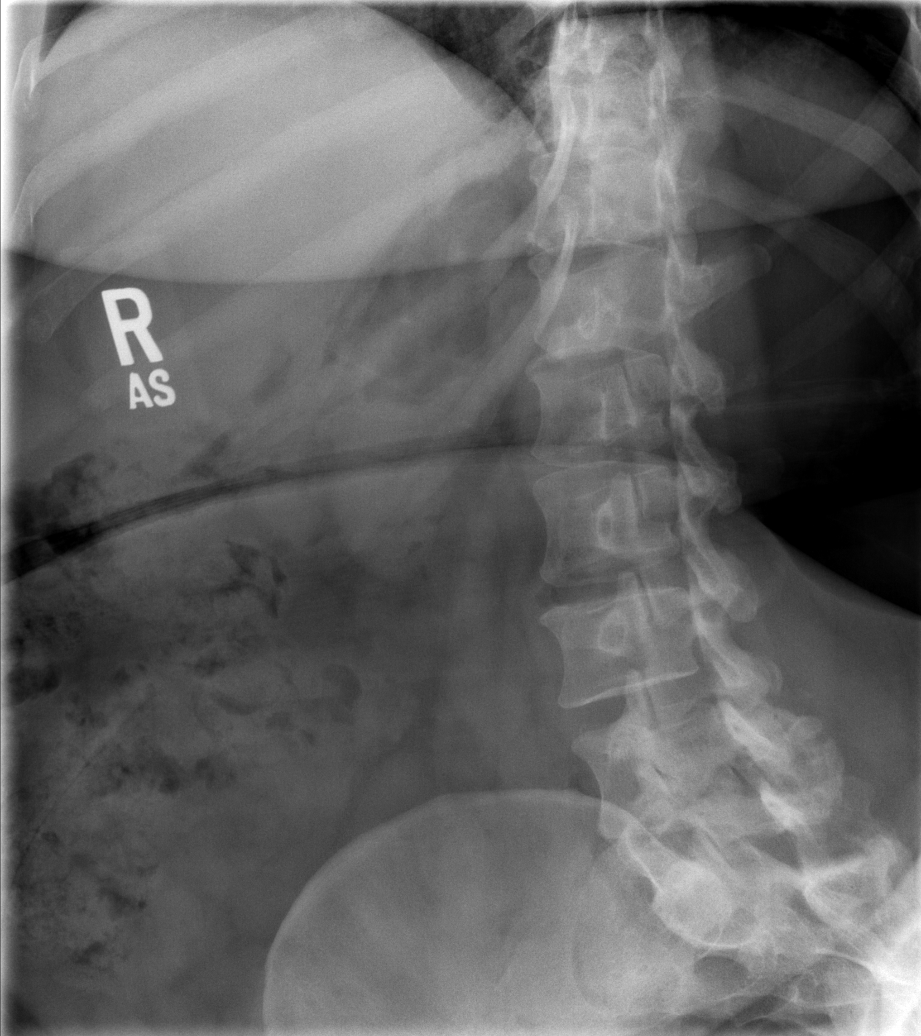

[t l-spine oblique exposure (2 of 2)]
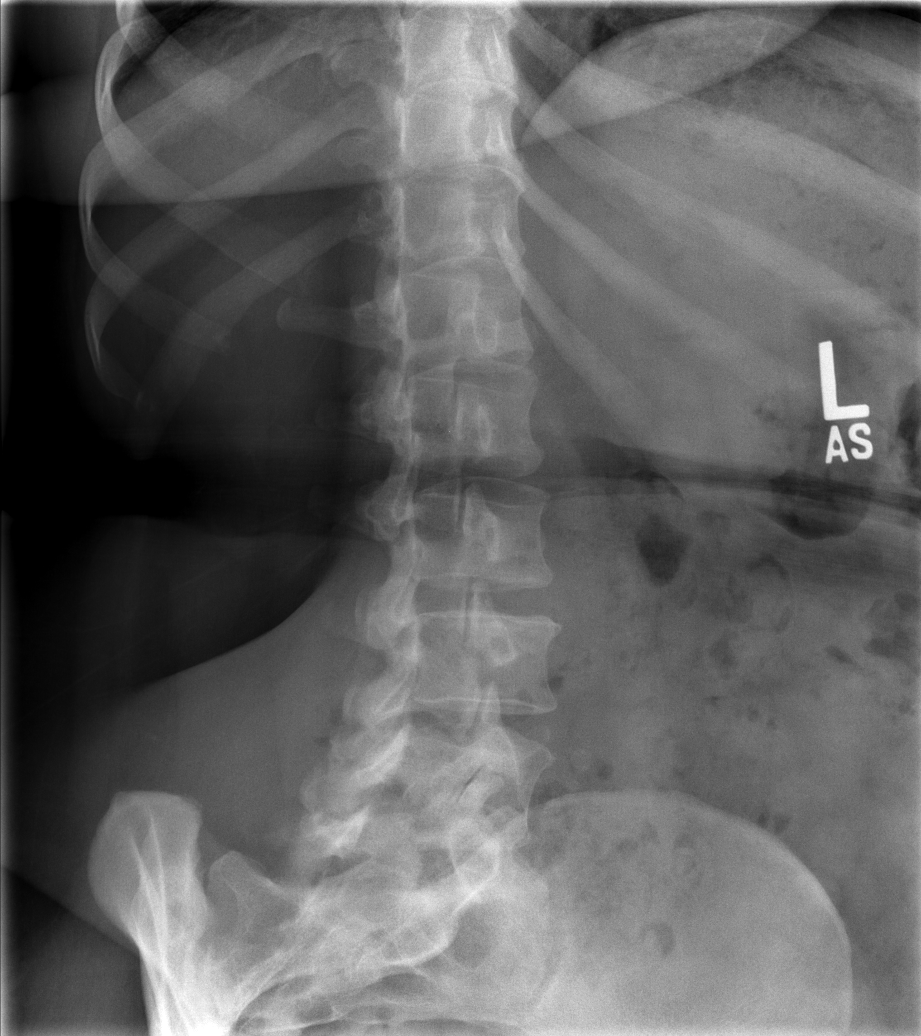

[t l-spine lat]
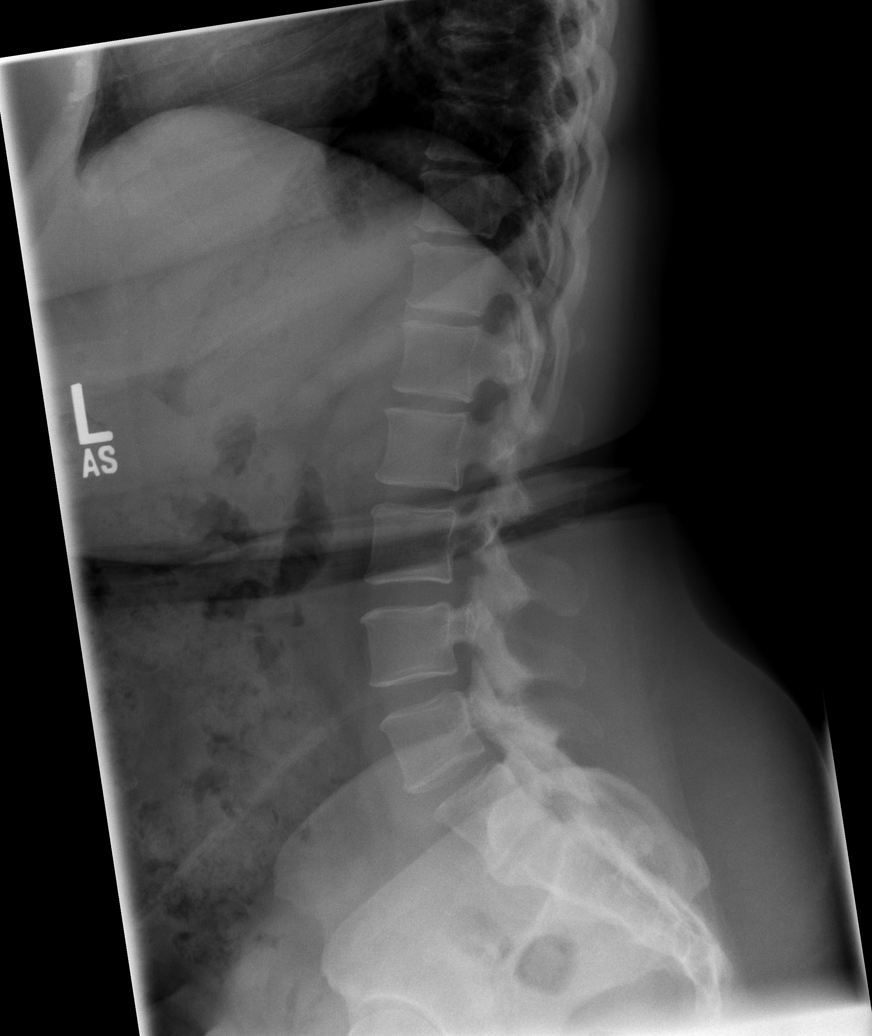

[t l-spine l5-s1 spot]
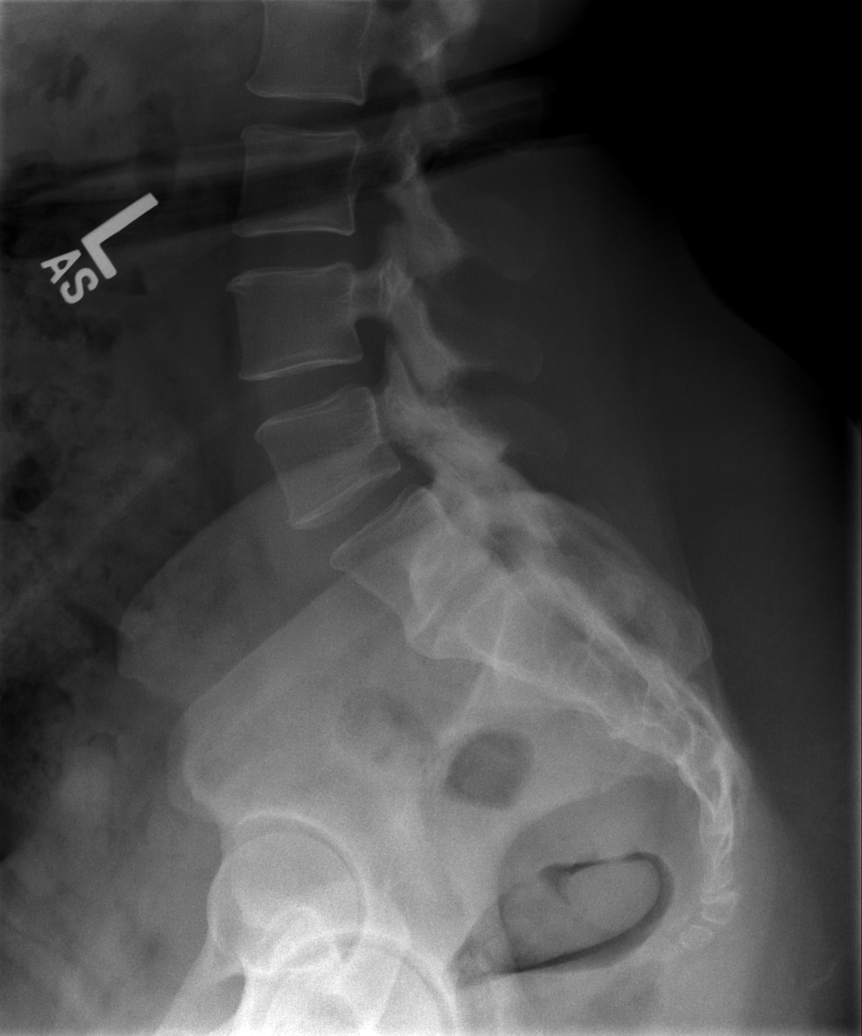

[5 of 5 positions shown; findings below may reference images not displayed]

FINDINGS: The patient has transitional anatomy at the lumbosacral
junction with partial lumbarization on the right.  Vertebral body
height and alignment maintained.  There is facet arthropathy at the
L5-S1 level.  Intervertebral disc space height appears maintained.
Paraspinous structures unremarkable.
IMPRESSION: 1.  No acute finding.
2.  Facet arthropathy lower lumbar spine.  Transitional anatomy
again noted.

## 2008-11-18 ENCOUNTER — Telehealth: Payer: Self-pay | Admitting: Family Medicine

## 2008-12-30 ENCOUNTER — Encounter: Payer: Self-pay | Admitting: Family Medicine

## 2009-01-13 ENCOUNTER — Telehealth: Payer: Self-pay | Admitting: *Deleted

## 2009-01-31 ENCOUNTER — Ambulatory Visit: Payer: Self-pay | Admitting: Family Medicine

## 2009-01-31 DIAGNOSIS — R002 Palpitations: Secondary | ICD-10-CM

## 2009-02-14 ENCOUNTER — Encounter: Payer: Self-pay | Admitting: Family Medicine

## 2009-02-14 ENCOUNTER — Ambulatory Visit: Payer: Self-pay | Admitting: Family Medicine

## 2009-02-14 LAB — CONVERTED CEMR LAB
ALT: 20 units/L (ref 0–35)
Alkaline Phosphatase: 85 units/L (ref 39–117)
BUN: 15 mg/dL (ref 6–23)
Chloride: 103 meq/L (ref 96–112)
Cholesterol: 189 mg/dL (ref 0–200)
Eosinophils Absolute: 0.2 10*3/uL (ref 0.0–0.7)
Eosinophils Relative: 4 % (ref 0–5)
Glucose, Bld: 107 mg/dL — ABNORMAL HIGH (ref 70–99)
Hemoglobin: 10.9 g/dL — ABNORMAL LOW (ref 12.0–15.0)
Lymphocytes Relative: 38 % (ref 12–46)
Lymphs Abs: 2.1 10*3/uL (ref 0.7–4.0)
Monocytes Absolute: 0.5 10*3/uL (ref 0.1–1.0)
Monocytes Relative: 9 % (ref 3–12)
Neutro Abs: 2.6 10*3/uL (ref 1.7–7.7)
Neutrophils Relative %: 48 % (ref 43–77)
Total Bilirubin: 0.3 mg/dL (ref 0.3–1.2)
Total Protein: 7.2 g/dL (ref 6.0–8.3)

## 2009-02-16 ENCOUNTER — Encounter: Payer: Self-pay | Admitting: Family Medicine

## 2009-07-11 ENCOUNTER — Ambulatory Visit: Payer: Self-pay | Admitting: Family Medicine

## 2010-02-06 ENCOUNTER — Ambulatory Visit: Payer: Self-pay | Admitting: Family Medicine

## 2010-02-19 ENCOUNTER — Encounter: Admission: RE | Admit: 2010-02-19 | Discharge: 2010-02-19 | Payer: Self-pay | Admitting: Family Medicine

## 2010-02-19 ENCOUNTER — Telehealth: Payer: Self-pay | Admitting: *Deleted

## 2010-02-20 ENCOUNTER — Telehealth: Payer: Self-pay | Admitting: Family Medicine

## 2010-03-07 ENCOUNTER — Telehealth: Payer: Self-pay | Admitting: *Deleted

## 2010-03-09 ENCOUNTER — Encounter: Payer: Self-pay | Admitting: *Deleted

## 2010-03-12 ENCOUNTER — Ambulatory Visit: Payer: Self-pay | Admitting: Family Medicine

## 2010-03-12 LAB — CONVERTED CEMR LAB: Hgb A1c MFr Bld: 6.2 %

## 2010-03-14 ENCOUNTER — Encounter: Payer: Self-pay | Admitting: Family Medicine

## 2010-03-19 LAB — CONVERTED CEMR LAB
Albumin: 4.4 g/dL (ref 3.5–5.2)
Alkaline Phosphatase: 65 units/L (ref 39–117)
BUN: 11 mg/dL (ref 6–23)
Chloride: 105 meq/L (ref 96–112)
Glucose, Bld: 105 mg/dL — ABNORMAL HIGH (ref 70–99)
Platelets: 282 10*3/uL (ref 150–400)
RBC: 3.94 M/uL (ref 3.87–5.11)
RDW: 16 % — ABNORMAL HIGH (ref 11.5–15.5)
Sodium: 139 meq/L (ref 135–145)
Total Protein: 6.9 g/dL (ref 6.0–8.3)
WBC: 4.9 10*3/uL (ref 4.0–10.5)

## 2010-06-17 ENCOUNTER — Encounter: Payer: Self-pay | Admitting: Family Medicine

## 2010-08-14 ENCOUNTER — Emergency Department (HOSPITAL_COMMUNITY)
Admission: EM | Admit: 2010-08-14 | Discharge: 2010-08-14 | Payer: Self-pay | Source: Home / Self Care | Admitting: Emergency Medicine

## 2010-08-21 ENCOUNTER — Ambulatory Visit: Admit: 2010-08-21 | Payer: Self-pay

## 2010-09-02 ENCOUNTER — Encounter: Payer: Self-pay | Admitting: Family Medicine

## 2010-09-11 NOTE — Assessment & Plan Note (Deleted)
Summary: Problem review   

## 2010-09-11 NOTE — Progress Notes (Signed)
Summary: phn msg  Phone Note Call from Patient Call back at [phone]   Caller: Patient Summary of Call: pt thinks that her son threw the paper away that was for [facility] needs a new referral Initial call taken by: De Nurse,  February 19, 2010 2:46 PM  Follow-up for Phone Call        pt lost order for x-ray. faxed new order to [facility]. Follow-up by: Tessie Fass CMA,  February 19, 2010 4:03 PM

## 2010-09-11 NOTE — Progress Notes (Signed)
----   Converted from flag ---- ---- 03/07/2010 9:49 AM, Zachery Dauer MD wrote: Please call her to schedule the lab draw that she no-showed for. Needed before I can do further refills. ------------------------------  Left message on machine # [phone], to call our office for a message from her MD.  Starleen Blue RN 03/07/2010      Additional Follow-up for Phone Call Additional follow up Details #2::    spoke with pt, told her to call the front office and reschedule her lab visit before she could receive any futher refills. Follow-up by: Tessie Fass CMA,  March 07, 2010 10:19 AM

## 2010-09-11 NOTE — Progress Notes (Signed)
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  Her fasting capillary blood glucoses have been in the lower 100's. She'd like an A1c done with her blood work.  Additional Follow-up by: Zachery Dauer MD,  February 21, 2010 5:34 PM    9

## 2010-09-11 NOTE — Letter (Signed)
Summary: Results Follow-up Letter  [facility]  [address]   [place], [place] [address]   Phone: [phone]  Fax: [phone]    03/14/2010  [address] [place], [place]  [address]  Dear [name],   The following are the results of your recent test(s): Patient: [facility]  Tests: (1) CBC NO Diff (Complete Blood Count) (10000)   WBC                       4.9 K/uL                    4.0-10.5   RBC                       3.94 MIL/uL                 3.87-5.11   Hemoglobin           [L]  10.0 g/dL                   42.5-95.6   Hematocrit           [L]  33.1 %                      36.0-46.0   MCV                       84.0 fL                     78.0-100.0 ! MCH                  [L]  25.4 pg                     26.0-34.0   MCHC                      30.2 g/dL                   38.7-56.4   RDW                  [H]  16.0 %                      11.5-15.5   Platelet Count            282 K/uL                    150-400 You've become a little more anemic with no obvious clues to the cause. I would like you to come to the lab for a recheck and I'll also draw tests for possible deficiencies. Tests: (2) Comprehensive Metabolic Panel (33295)   Sodium                    139 mEq/L                   135-145   Potassium                 4.1 mEq/L                   3.5-5.3   Chloride                  105 mEq/L  96-112   CO2                       24 mEq/L                    19-32   Glucose              [H]  105 mg/dL                   16-10   BUN                       11 mg/dL                    9-60   Creatinine                0.66 mg/dL                  0.40-1.20   Bilirubin, Total          0.4 mg/dL                   4.5-4.0   Alkaline Phosphatase      65 U/L                      39-117   AST/SGOT                  27 U/L                      0-37   ALT/SGPT                  22 U/L                      0-35   Total Protein             6.9 g/dL                     9.8-1.1   Albumin                   4.4 g/dL                    9.1-4.7   Calcium                   9.1 mg/dL                   8.2-95.6 If you were fasting, your glucose was in the prediabetic range. Document Creation Date: 03/12/2010 11:41 PM Sincerely,  Zachery Dauer MD [facility]            Appended Document: Results Follow-up Letter mailed

## 2010-09-11 NOTE — Miscellaneous (Signed)
Summary: re: BP meds/TS  Clinical Lists Changes called pt and advised that she can not have a refill on her HTN meds per Dr.Hale. Pt needs her blood work first before refills. Sched. lab visit for monday morning. Pt agreed and will keep appt. Pt dnka several times and needs to keep this lab appt on Monday. Arlyss Repress CMA,  March 09, 2010 5:06 PM

## 2010-09-11 NOTE — Assessment & Plan Note (Signed)
Summary: back pain/refill meds,df   Vital Signs:  Patient profile:   40 year old female Menstrual status:  regular Height:      61 inches Weight:      201.2 pounds BMI:     38.15 Temp:     99.7 degrees F oral Pulse rate:   82 / minute BP sitting:   123 / 81  (left arm)  Vitals Entered By: Jimmy Footman, CMA (February 06, 2010 4:31 PM) CC: Lower back pain x6 mths, needs refill on med Is Patient Diabetic? No Pain Assessment Patient in pain? yes     Location: lower back Intensity: 8 Type: sharp Comments Pt. states that pain varies from sharp to an aching pain   Primary Care Provider:  Zachery Dauer, MD  CC:  Lower back pain x6 mths and needs refill on med.  History of Present Illness: Continues low back pain, now for 6 months. Worse with standing, better in bed. No leg symptoms other than discomfort in anterior lateral thighs without numbness. No history of injury  Awoke with palpitations once 2 months ago. occasional skipped beat. Needs refill on metoprolol  Hasn't been taking iron.    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  Alcohol Use - no Illicit Drug Use - no Smoking Status:  quit > 6 months  Physical Exam  General:  alert and overweight-appearing.   Lungs:  Normal respiratory effort, chest expands symmetrically. Lungs are clear to auscultation, no crackles or wheezes. Heart:  Normal rate and regular rhythm. S1 and S2 normal without  gallop, murmur, click, rub or other extra sounds. Abdomen:  Bowel sounds positive,abdomen soft and non-tender without masses, organomegaly or hernias noted.  Exam limited by obesity though this is generalized and not concentrated in abdomen Msk:  Neg straight leg raise, negative stressing of SI joint on left Decreased range of motion due to obesity, but no acute pain. Strenght 5/5  Pulses:  Pedal pulses normal Neurologic:  Normal heel and toe stand.gait normal and DTRs symmetrical and normal.   Psych:  memory intact for recent and remote, normally interactive, not depressed appearing, and slightly anxious.     Impression & Recommendations:  Problem # 1:  BACK STRAIN, LUMBAR (ICD-847.2) Chronic, rule out underlying skeletal problem. More likely related to bad body mechanics. She's now learned how to lift correctly. I instructed her in crunches to strengthen her abdominals Orders: Diagnostic X-Ray/Fluoroscopy (Diagnostic X-Ray/Flu) [facility]- Est Level  3 ([address]213)Future Orders: Sed Rate (ESR)-[facility] [id number]) ... 01/11/2011  Problem # 2:  PALPITATIONS, RECURRENT (ICD-785.1) Minimal symptoms recently Her updated medication list for this problem includes:    Metoprolol Tartrate 25 Mg Tabs (Metoprolol tartrate) .Marland Kitchen... Take one tablet two times a day  Orders: [facility]- Est Level  3 ([address]454)  Problem # 3:  HYPERTENSION, BENIGN SYSTEMIC (ICD-401.1)  Her updated medication list for this problem includes:    Enalapril-hydrochlorothiazide 5-12.5 Mg Tabs (Enalapril-hydrochlorothiazide) .Marland Kitchen... Take 1 tablet by mouth  once a day    Metoprolol Tartrate 25 Mg Tabs (Metoprolol tartrate) .Marland Kitchen... Take one tablet two times a day  Orders: [facility]- Est Level  3 (99213)Future Orders: Comp Met-[facility] ([id number]) ... 01/11/2011  Problem # 4:  Hx of ANEMIA, IRON DEFICIENCY NEC (ICD-280.8) Recommended continuing iron, but will check CBC Future Orders: CBC-[facility] (42595) ... 01/11/2011  Complete Medication List: 1)   Enalapril-hydrochlorothiazide 5-12.5 Mg Tabs (Enalapril-hydrochlorothiazide) .... Take 1 tablet by mouth once a day 2)  Metoprolol Tartrate 25 Mg Tabs (Metoprolol tartrate) .... Take one tablet two times a day 3)  Tramadol Hcl 50 Mg Tabs (Tramadol hcl) .Marland Kitchen.. 1-2 tabs by mouth q6h as needed severe pain 4)  Ibuprofen 200 Mg Tabs (Ibuprofen) .... 2 tabs two times a day for 5 days 5)  Acetaminophen 650 Mg Cr-tabs (Acetaminophen) .... Take 2 tabs every 6 hr as needed pain.  Patient Instructions: 1)  Please schedule a follow-up appointment in 2 weeks.  2)  Please return for lab work one(1) week before your next appointment.

## 2010-09-13 ENCOUNTER — Encounter: Payer: Self-pay | Admitting: *Deleted

## 2010-09-18 ENCOUNTER — Encounter: Payer: Self-pay | Admitting: Family Medicine

## 2010-09-18 ENCOUNTER — Ambulatory Visit (INDEPENDENT_AMBULATORY_CARE_PROVIDER_SITE_OTHER): Payer: PRIVATE HEALTH INSURANCE | Admitting: Family Medicine

## 2010-09-18 DIAGNOSIS — E669 Obesity, unspecified: Secondary | ICD-10-CM

## 2010-09-18 DIAGNOSIS — R358 Other polyuria: Secondary | ICD-10-CM | POA: Insufficient documentation

## 2010-09-18 DIAGNOSIS — R3589 Other polyuria: Secondary | ICD-10-CM | POA: Insufficient documentation

## 2010-09-18 DIAGNOSIS — I10 Essential (primary) hypertension: Secondary | ICD-10-CM

## 2010-09-18 DIAGNOSIS — M2550 Pain in unspecified joint: Secondary | ICD-10-CM

## 2010-09-18 LAB — CONVERTED CEMR LAB
CO2: 23 meq/L (ref 19–32)
Calcium: 9.3 mg/dL (ref 8.4–10.5)
Creatinine, Ser: 0.74 mg/dL (ref 0.40–1.20)
HCT: 34.3 % — ABNORMAL LOW (ref 36.0–46.0)
Platelets: 335 10*3/uL (ref 150–400)
Potassium: 3.9 meq/L (ref 3.5–5.3)
RDW: 15.2 % (ref 11.5–15.5)
Sodium: 136 meq/L (ref 135–145)

## 2010-09-27 NOTE — Miscellaneous (Signed)
Summary: Problem review   

## 2010-09-27 NOTE — Assessment & Plan Note (Signed)
Summary: F/U back pain   Vital Signs:  Patient profile:   40 year old female Menstrual status:  regular Height:      61 inches Weight:      202.7 pounds BMI:     38.44 Temp:     98.7 degrees F oral Pulse rate:   77 / minute BP sitting:   115 / 72  (left arm) Cuff size:   regular  Vitals Entered By: Garen Grams LPN (September 18, 2010 3:57 PM) CC: f/u back pain Is Patient Diabetic? No   Primary Care Provider:  Zachery Dauer, MD  CC:  f/u back pain.  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   Her back hurts. She hasn't been doing the crunches because her stomach muscles cramp.  Hasn't been able to limit her salt intake.   Has polyuria, polydipsia, and nocturia x 2. Has a glucometer but hasn't recorded the results.   Habits & Providers  Alcohol-Tobacco-Diet     Tobacco Status: quit > 6 months     Year Quit: 1995     Passive Smoke Exposure: no  Allergies: No Known Drug Allergies  Physical Exam  General:  alert and obese-appearing.   Lungs:  Normal respiratory effort, chest expands symmetrically. Lungs are clear to auscultation, no crackles or wheezes. Heart:  Normal rate and regular rhythm. S1 and S2 normal without gallop, murmur, click, rub or other extra sounds. Abdomen:  Bowel sounds positive,abdomen soft and non-tender without masses, organomegaly or hernias noted.  Exam limited by obesity though this is generalized and not concentrated in abdomen Extremities:  trace left pedal edema and trace right pedal edema.   Psych:  memory intact for recent and remote, normally interactive, not depressed appearing, and slightly anxious.     Impression & Recommendations:  Problem # 1:  HYPERTENSION, BENIGN SYSTEMIC (ICD-401.1) Assessment Unchanged  Her updated medication list for this problem includes:  Enalapril-hydrochlorothiazide 5-12.5 Mg Tabs (Enalapril-hydrochlorothiazide) .Marland Kitchen... Take 1 tablet by mouth once a day    Metoprolol Succinate 50 Mg Xr24h-tab (Metoprolol succinate) .Marland Kitchen... Take one tablet daily  Orders: Basic Met-[facility] ([id number]) [facility]- Est Level  3 ([address])  Problem # 2:  OBESITY, NOS (ICD-278.00) Assessment: Unchanged  Orders: [facility]- Est Level  3 (91478)  Problem # 3:  ARRHYTHMIA, HX OF (ICD-V12.50) Assessment: Improved  Problem # 4:  POLYURIA (GNF-621.30)  Orders: Basic Met-[facility] ([id number]) A1C-[facility] (95284) [facility]- Est Level  3 (13244)  Problem # 5:  PAIN IN JOINT, MULTIPLE SITES (ICD-719.49) Back pain. Recommened gradual increase in exercise.  Orders: [facility]- Est Level  3 (01027)  Problem # 6:  Hx of ANEMIA, IRON DEFICIENCY NEC (ICD-280.8)  Orders: CBC-[facility] (25366)  Complete Medication List: 1)  Enalapril-hydrochlorothiazide 5-12.5 Mg Tabs (Enalapril-hydrochlorothiazide) .... Take 1 tablet by mouth once a day 2)  Metoprolol Succinate 50 Mg Xr24h-tab (Metoprolol succinate) .... Take one tablet daily 3)  Tramadol Hcl 50 Mg Tabs (Tramadol hcl) .Marland Kitchen.. 1-2 tabs by mouth q6h as needed severe pain 4)  Ibuprofen 200 Mg Tabs (Ibuprofen) .... 2 tabs two times a day for 5 days 5)  Acetaminophen 650 Mg Cr-tabs (Acetaminophen) .... Take 2 tabs every 6 hr as needed pain.  Patient Instructions: 1)  Please schedule a follow-up appointment in 3 months .  2)  You will walk 10 minutes 3 days weekly and increase by 5  minutes each month. 3)  Increase the fiber in your diet. You will replace sweet tea with unsweetened with sweetener.  Drink more fluids with meals. 4)  Do crunches 3 times weekly  5)  Limit your Sodium(salt) .  Try spices and salt substitutes.  Prescriptions: TRAMADOL HCL 50 MG TABS (TRAMADOL HCL) 1-2 tabs by mouth q6h as needed severe pain  #60 x 2   Entered and Authorized by:   Zachery Dauer MD   Signed by:   Zachery Dauer MD on 09/18/2010   Method used:   Electronically  to        [facility]  [phone]* (retail)       [address]       [place], [place]  [address]       Ph: [phone] or [phone]       Fax: [phone]   RxID:   [id number]    Orders Added: 1)  Basic Met-[facility] [phone] 2)  A1C-[facility] [83036] 3)  CBC-[facility] [85027] 4)  [facility]- Est Level  3 [87564]    Laboratory Results   Blood Tests   Date/Time Received: September 18, 2010 5:19 PM  Date/Time Reported: September 18, 2010 5:20 PM   HGBA1C: 6.2%   (Normal Range: Non-Diabetic - 3-6%   Control Diabetic - 6-8%)  Comments: ...............test performed by......Marland KitchenBonnie A. Swaz[place], MLS (ASCP)cm

## 2010-10-23 ENCOUNTER — Telehealth: Payer: Self-pay | Admitting: Family Medicine

## 2010-10-23 NOTE — Telephone Encounter (Signed)
Is wanting to know results of last labs

## 2010-10-24 ENCOUNTER — Other Ambulatory Visit: Payer: Self-pay | Admitting: Family Medicine

## 2010-10-24 MED ORDER — TRAMADOL HCL 50 MG PO TABS: 50.0000 mg | ORAL_TABLET | Freq: Four times a day (QID) | ORAL | Status: DC | PRN

## 2010-10-24 MED ORDER — ACETAMINOPHEN ER 650 MG PO TBCR: 1300.0000 mg | EXTENDED_RELEASE_TABLET | Freq: Three times a day (TID) | ORAL | Status: AC | PRN

## 2010-10-24 NOTE — Telephone Encounter (Signed)
I called and gave her her normal lab results. She said that the Tramadol rx didn't go through so I sent it again electronically

## 2010-12-25 NOTE — H&P (Signed)
NAME:  Adrienne Collins, Adrienne Collins NO.:  [id number]   MEDICAL RECORD NO.:  [id number]          PATIENT TYPE:  AMB   LOCATION:  [facility]                           FACILITY:  [facility]   PHYSICIAN:  Juluis Mire, M.D.   DATE OF BIRTH:  1971-02-17   DATE OF ADMISSION:  02/18/2008  DATE OF DISCHARGE:                              HISTORY & PHYSICAL   The patient is 40 year old gravida 2, para 2 female presents for  laparoscopic evaluation.  The patient was initially seen in our office  in April complaining of left-sided pelvic pain and discomfort.  Evaluation at that point in time revealed some pelvic fullness and  tenderness.  She subsequently underwent an ultrasound evaluation.  This  did reveal a 5- x 3-cm right-sided cystic mass that looks like a  hydrosalpinx.  Her CA-125 was negative.  A followup ultrasound revealed  persistence of this area and that had actually gotten a little bit  bigger measuring 7 x 3.5 cm.  Again, it appeared to be a hydrosalpinx in  view of persistent enlargement, we are going to proceed with  laparoscopic removal.   In terms of allergies, she has no known drug allergies.   MEDICATION:  She is on blood pressure medicine in the form of metoprolol  and enalapril/hydrochlorothiazide 25 mg.  She is also on iron sulfate  supplementation.   PAST MEDICAL HISTORY:  1. History of hypertension under active management.  2. History of anemia.   OPERATIVE PROCEDURES:  Two previous C-sections.   FAMILY HISTORY:  Noncontributory.   SOCIAL HISTORY:  Reveals no tobacco or alcohol use.   REVIEW OF SYSTEMS:  Noncontributory.   PHYSICAL EXAMINATION:  VITAL SIGNS:  The patient is afebrile, stable  vital signs.  HEENT:  The patient is normocephalic.  Pupils are equal, round, and  reactive to light and accommodation.  Extraocular movements were intact.  Sclerae and conjunctivae are clear.  Oropharynx is clear.  NECK:  Without thyromegaly.  BREAST:  Not examined.  LUNGS:  Clear.  CARDIOVASCULAR:  Regular rate.  No murmurs or gallops.  ABDOMEN:  Benign.  PELVIC:  Normal external genitalia.  Vaginal mucosa is clear.  Cervix  unremarkable.  Uterus normal size, shape, and contour.  Slight cul-de-  sac fullness.  Adnexa otherwise unremarkable.  EXTREMITIES:  Trace edema.  NEUROLOGIC:  Grossly normal limits.   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   PLAN:  The patient is to undergo laparoscopic evaluation to remove the  cystic area .  The risks of surgery have been discussed including the  risk of infection.  The risk of hemorrhage could require transfusion  with the risk of AIDS or hepatitis.  The risk of injury to adjacent  organs including bladder, bowel, ureters that could require further  exploratory surgery.  The risk of deep venous thrombosis and pulmonary  embolus.  The patient expressed understanding of indications and risks.      Juluis Mire, M.D.  Electronically Signed     JSM/MEDQ  D:  02/18/2008  T:  02/18/2008  Job:  [id number]

## 2010-12-25 NOTE — Op Note (Signed)
NAME:  Adrienne Collins, Adrienne Collins NO.:  [id number]   MEDICAL RECORD NO.:  [id number]          PATIENT TYPE:  AMB   LOCATION:  [facility]                           [facility]ILITY[facility]   PHYSICIAN:  Juluis Mire, M.D.   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   POSTOPERATIVE DIAGNOSIS:  Pelvic adhesions with right hydrosalpinx.   PROCEDURE:  1. Open laparoscopy.  2. Extensive lysis of adhesions.  3. Right salpingectomy.   SURGEON:  Juluis Mire, MD.   ANESTHESIA:  General endotracheal.   ESTIMATED BLOOD LOSS:  Minimal.   PACKS AND DRAINS:  None.   INTRAOPERATIVE BLOOD PLACED:  None.   COMPLICATIONS:  None.   INDICATIONS:  As dictated in history and physical.   PROCEDURE IN DETAIL:  The patient was taken to OR and placed in supine  position.  After satisfactory level of general endotracheal anesthesia  was obtained, the patient was placed in the dorsal lithotomy position  using the Allen stirrups.  At this point, the abdomen, perineum, and  vagina were cleansed out with Betadine.  Bladder was emptied by in-and-  out catheterization.  A Hulka tenaculum was put in place and secured.  The patient was draped as sterile field.  A subumbilical incision was  made with a knife and carried through the subcutaneous tissue.  The  fascia was entered sharply.  The incision was fashioned laterally.  The  muscles were separated and the peritoneum was entered with blunt  pressure from a finger.  A open laparoscopic trocar was put in place.  Laparoscope was introduced and the abdomen inflated with carbon dioxide.  There were some omental adhesions anteriorly to umbilical incision.  A 5-  mm trocar was then placed in the suprapubic area.  The appendix was  noted and noted to be normal.  Upper abdomen including the liver and tip  of the gallbladder  were clear.  Uterus was elevated.  There were  adhesions from the right tube and ovary and there was a large right  hydrosalpinx.  The left tube and ovary also were involved in adhesions.  We brought in unipolar scissors.  We were able to free the adhesions  from the right ovary and right tube.  Most of these were flimsy.  At  this point, we were able to free up the tube from the right ovary and  elevated.  We then brought in the bipolar.  We then cauterized a mid  segment of the tube and mesosalpinx and incised this.  We continued  cautery incision separating the tube from its mesenteric attachment.  The tube was then drained and then delivered through the subumbilical  port and sent to pathology.  We had good hemostasis and right ovary was  well freed up.  We then went to the left side.  Using the unipolar  scissors, we were able to separate the adhesions, free up the left ovary  and left tube.  Tube was somewhat phimosed at the end but still somewhat  opened.  There was some oozing brought under control with the bipolar.  At this point, we thoroughly irrigated the pelvis.  We had good  hemostasis.  No evidence of injury to adjacent organs.  The abdomen was  deflated of carbon dioxide.  All trocars were removed.  Subumbilical  fascia was closed with figure-of-eight of 0 Vicryl.  Skin was closed  with interrupted subcuticulars of 4-0 Vicryl.  The suprapubic incision  was closed with Dermabond.  The Hulka tenaculum was then removed.  The  patient was taken out of the dorsal lithotomy position once alert and  extubated and transferred to recovery room in good condition.  Sponge,  instrument, and needle count reported as correct by circulating nurse  x2.      Juluis Mire, M.D.  Electronically Signed     JSM/MEDQ  D:  02/18/2008  T:  02/18/2008  Job:  [id number]

## 2011-01-31 ENCOUNTER — Encounter: Payer: Self-pay | Admitting: Family Medicine

## 2011-02-01 ENCOUNTER — Encounter: Payer: Self-pay | Admitting: Family Medicine

## 2011-03-26 ENCOUNTER — Other Ambulatory Visit: Payer: Self-pay | Admitting: Family Medicine

## 2011-03-26 DIAGNOSIS — I10 Essential (primary) hypertension: Secondary | ICD-10-CM

## 2011-03-26 NOTE — Telephone Encounter (Signed)
Refill request

## 2011-04-10 ENCOUNTER — Other Ambulatory Visit: Payer: Self-pay | Admitting: Family Medicine

## 2011-04-10 NOTE — Telephone Encounter (Signed)
Refill request

## 2011-05-09 LAB — CBC
HCT: 38.6
Hemoglobin: 12.6
MCHC: 32.7
MCV: 82.5
Platelets: 278
RBC: 4.68
RDW: 14.8

## 2011-05-09 LAB — BASIC METABOLIC PANEL
Calcium: 9.6
Creatinine, Ser: 0.61
GFR calc non Af Amer: >60
Potassium: 3.4 — ABNORMAL LOW

## 2011-05-09 LAB — HCG, SERUM, QUALITATIVE: Preg, Serum: NEGATIVE

## 2011-06-18 ENCOUNTER — Encounter: Payer: PRIVATE HEALTH INSURANCE | Admitting: Family Medicine

## 2011-07-02 ENCOUNTER — Encounter: Payer: PRIVATE HEALTH INSURANCE | Admitting: Family Medicine

## 2011-08-16 ENCOUNTER — Other Ambulatory Visit: Payer: Self-pay | Admitting: Family Medicine

## 2011-08-16 DIAGNOSIS — I10 Essential (primary) hypertension: Secondary | ICD-10-CM

## 2011-08-16 NOTE — Telephone Encounter (Signed)
Refill request

## 2011-08-19 ENCOUNTER — Other Ambulatory Visit: Payer: Self-pay | Admitting: Family Medicine

## 2011-08-19 NOTE — Telephone Encounter (Signed)
Adrienne Collins is calling because [facility] did not receive the refill for Toprol.

## 2011-08-19 NOTE — Telephone Encounter (Signed)
Pharmacy has  rx now , patient notified.

## 2011-08-20 ENCOUNTER — Encounter: Payer: Self-pay | Admitting: Family Medicine

## 2011-08-20 ENCOUNTER — Ambulatory Visit (INDEPENDENT_AMBULATORY_CARE_PROVIDER_SITE_OTHER): Payer: PRIVATE HEALTH INSURANCE | Admitting: Family Medicine

## 2011-08-20 ENCOUNTER — Ambulatory Visit: Payer: PRIVATE HEALTH INSURANCE

## 2011-08-20 VITALS — BP 96/62 | HR 52 | Temp 98.1°F | Ht 62.0 in | Wt 200.0 lb

## 2011-08-20 DIAGNOSIS — J019 Acute sinusitis, unspecified: Secondary | ICD-10-CM

## 2011-08-20 MED ORDER — AMOXICILLIN-POT CLAVULANATE 875-125 MG PO TABS: 1.0000 | ORAL_TABLET | Freq: Two times a day (BID) | ORAL | Status: AC

## 2011-08-20 NOTE — Progress Notes (Signed)
Subjective: The patient is a 41 y.o. year old female who presents today for tooth pulled 08/14/11.  Told was very close to sinus and that could end up with recurrent sinus infections.  Began having fevers and sinus pressure 2-3 days after this. The patient has not had problems with sinus infections before. She also notes that, several days ago, she noticed that she could blow air her out of her mouth into what feels like her sinus. This has since stopped. She continues to report facial pressure on the left side, left-sided facial pain, and intermittent fevers. She is not having any other systemic signs. No problems breathing, no nausea vomiting, no diarrhea, no chest pain.  Objective:  Filed Vitals:   08/20/11 1118  BP: 96/62  Pulse: 52  Temp: 98.1 F (36.7 C)   Gen: No acute distress HEENT: Tympanic membranes normal bilaterally, membranes moist, left back upper molar has recently been removed. There is no obvious pus draining from the site of the tooth although the underlying bone can still be seen. There is no bleeding or drainage. There is no obvious visual indication with a sinus. There is facial tenderness on the left but not on the right. Extraocular movements are intact. Visual acuity is normal. There is no conjunctival injection. There is no cervical adenopathy. CV: Regular rate and rhythm Resp: Clear to auscultation bilaterally  Assessment/Plan: Sinus infection following recent dental work. Will treat with 10 days of Augmentin. If recurs or worsens would consider CT of the sinuses. Would also consider referral to oral surgeon at that time. Patient instructed on red flags for return.  Please also see individual problems in problem list for problem-specific plans.

## 2011-08-20 NOTE — Patient Instructions (Signed)
It was great to see you today! I want you to gargle with mouthwash 2x per day I want you to take an antibiotic 2x per day with meals for 10 days.

## 2011-09-03 ENCOUNTER — Ambulatory Visit (INDEPENDENT_AMBULATORY_CARE_PROVIDER_SITE_OTHER): Payer: PRIVATE HEALTH INSURANCE | Admitting: Family Medicine

## 2011-09-03 ENCOUNTER — Encounter: Payer: Self-pay | Admitting: Family Medicine

## 2011-09-03 VITALS — BP 116/66 | HR 67 | Temp 98.1°F | Ht 62.0 in | Wt 201.1 lb

## 2011-09-03 DIAGNOSIS — N760 Acute vaginitis: Secondary | ICD-10-CM | POA: Insufficient documentation

## 2011-09-03 LAB — POCT WET PREP (WET MOUNT): Trichomonas Wet Prep HPF POC: NEGATIVE

## 2011-09-03 MED ORDER — FLUCONAZOLE 150 MG PO TABS: 150.0000 mg | ORAL_TABLET | Freq: Once | ORAL | Status: AC

## 2011-09-03 NOTE — Progress Notes (Signed)
  Subjective:    Patient ID: Adrienne Collins, female    DOB: 24-Aug-1970, 41 y.o.   MRN: [id number]  HPI here for vaginal itching for several weeks  Jan 8 10 days of augmentin for sinus infection.    Vaginal discharge noted to be mucous, no odor, no blood.  Has not have intercourse in 10 years.  No dysuria, urinary frequency, abdominal/pelvic pain.  Review of Systems see HPI     Objective:   Physical Exam GEN: Alert & Oriented, No acute distress Pelvic Exam:        External: normal female genitalia without lesions or masses        Vagina: normal without lesions or masses        Cervix: normal without lesions or masses        Adnexa: normal bimanual exam without masses or fullness        Uterus: normal by palpation        Samples for Wet prep         Assessment & Plan:

## 2011-09-03 NOTE — Patient Instructions (Signed)
Likely yeast infection  Will call you at [phone] if results show otherwise  Fluconazole for yeast infection.  Can also treat with OTC monistat cream

## 2011-09-03 NOTE — Assessment & Plan Note (Signed)
Will treat empirically for yeast vaginitis given recent abx use and clinical symptoms.  Will let her know if wet prep shows BV.  Low risk for STD, no sex in past 10 years.

## 2011-09-24 ENCOUNTER — Ambulatory Visit (INDEPENDENT_AMBULATORY_CARE_PROVIDER_SITE_OTHER): Payer: PRIVATE HEALTH INSURANCE | Admitting: Family Medicine

## 2011-09-24 ENCOUNTER — Encounter: Payer: Self-pay | Admitting: Family Medicine

## 2011-09-24 VITALS — BP 119/69 | HR 68 | Temp 98.4°F | Ht 62.0 in | Wt 204.4 lb

## 2011-09-24 DIAGNOSIS — N760 Acute vaginitis: Secondary | ICD-10-CM

## 2011-09-24 LAB — POCT WET PREP (WET MOUNT)

## 2011-09-24 MED ORDER — FLUCONAZOLE 150 MG PO TABS: 150.0000 mg | ORAL_TABLET | Freq: Once | ORAL | Status: AC

## 2011-09-24 NOTE — Patient Instructions (Signed)
You have a yeast infection.  I will send in the Diflucan as per your previous infection.

## 2011-09-24 NOTE — Assessment & Plan Note (Signed)
Yeast vaginitis based on symptoms, physical exam, positive wet prep. We'll send in Diflucan as per previous infection.

## 2011-09-24 NOTE — Progress Notes (Signed)
  Subjective:    Patient ID: Adrienne Collins, female    DOB: 07-31-1971, 41 y.o.   MRN: [id number]  HPI  #1. Vaginal discharge: Patient had episode of gastrointestinal illness with diarrhea that lasted Friday Saturday Sunday this past weekend. Starting on Sunday and continuing today she began experiencing thick white vaginal discharge as well as vaginal itching. She does have a history of yeast infection in the past. She states it feels "just like before." She has not tried anything over-the-counter for relief. No dysuria or urinary hesitancy/frequency. No abdominal pain nausea or vomiting.  Review of Systems See HPI above for review of systems.       Objective:   Physical Exam Gen:  Alert, cooperative patient who appears stated age in no acute distress.  Vital signs reviewed. GYN:  External genitalia within normal limits.  Vaginal mucosa pink, moist, normal rugae.  Nonfriable cervix without lesions, thick white discharge noted on speculum exam.     Assessment & Plan:

## 2011-11-02 ENCOUNTER — Other Ambulatory Visit: Payer: Self-pay | Admitting: Family Medicine

## 2011-11-12 ENCOUNTER — Encounter: Payer: PRIVATE HEALTH INSURANCE | Admitting: Family Medicine

## 2011-12-06 ENCOUNTER — Other Ambulatory Visit: Payer: Self-pay | Admitting: Family Medicine

## 2011-12-06 NOTE — Telephone Encounter (Signed)
No further refills until seen. She is overdue for labs to ensure the safety of taking this medication

## 2012-01-04 ENCOUNTER — Other Ambulatory Visit: Payer: Self-pay | Admitting: Family Medicine

## 2012-01-04 DIAGNOSIS — I10 Essential (primary) hypertension: Secondary | ICD-10-CM

## 2012-02-11 ENCOUNTER — Ambulatory Visit (HOSPITAL_COMMUNITY)
Admission: RE | Admit: 2012-02-11 | Discharge: 2012-02-11 | Disposition: A | Discharge status: Home / Self Care | Payer: PRIVATE HEALTH INSURANCE | Source: Ambulatory Visit | Attending: Family Medicine | Admitting: Family Medicine

## 2012-02-11 ENCOUNTER — Ambulatory Visit (INDEPENDENT_AMBULATORY_CARE_PROVIDER_SITE_OTHER): Payer: PRIVATE HEALTH INSURANCE | Admitting: Family Medicine

## 2012-02-11 ENCOUNTER — Encounter: Payer: Self-pay | Admitting: Family Medicine

## 2012-02-11 VITALS — BP 113/75 | HR 60 | Ht 62.0 in | Wt 203.0 lb

## 2012-02-11 DIAGNOSIS — I498 Other specified cardiac arrhythmias: Secondary | ICD-10-CM | POA: Insufficient documentation

## 2012-02-11 DIAGNOSIS — R0789 Other chest pain: Secondary | ICD-10-CM

## 2012-02-11 DIAGNOSIS — K044 Acute apical periodontitis of pulpal origin: Secondary | ICD-10-CM

## 2012-02-11 DIAGNOSIS — I10 Essential (primary) hypertension: Secondary | ICD-10-CM

## 2012-02-11 DIAGNOSIS — E669 Obesity, unspecified: Secondary | ICD-10-CM

## 2012-02-11 DIAGNOSIS — D508 Other iron deficiency anemias: Secondary | ICD-10-CM

## 2012-02-11 DIAGNOSIS — K047 Periapical abscess without sinus: Secondary | ICD-10-CM

## 2012-02-11 DIAGNOSIS — R079 Chest pain, unspecified: Secondary | ICD-10-CM | POA: Insufficient documentation

## 2012-02-11 DIAGNOSIS — R002 Palpitations: Secondary | ICD-10-CM

## 2012-02-11 HISTORY — DX: Other chest pain: R07.89

## 2012-02-11 NOTE — Patient Instructions (Addendum)
Schedule a pap smear with Dr Gaye Alken  Call if you develop exertional or recurrent chest pain  Schedule a fasting lipid profile  Please return to see Dr Sheffield Slider in 6 months.

## 2012-02-11 NOTE — Assessment & Plan Note (Signed)
No improvement, will check FBS and lipids

## 2012-02-11 NOTE — Assessment & Plan Note (Signed)
Continued heavy menses. Will check CBC and iron.

## 2012-02-11 NOTE — Assessment & Plan Note (Addendum)
Happen occasionally, but much decreased on the beta blocker.

## 2012-02-11 NOTE — Progress Notes (Signed)
  Subjective:    Patient ID: Adrienne Collins, female    DOB: 1970/12/30, 41 y.o.   MRN: [id number]  HPI Chest pain - 2 days ago when she got up she had chest tightness on both sides without associated shortness of breath or radiation to the neck or arm. No GERD symptoms. Lasted 30-40 minutes.  Palpitations - occur occasionally though much less than before she was taking the metoprolol  Anemia -recently has had more pica for chewing ice. Menses continue with occasional irregularity and clots throughout.   Pap smear - this hasn't been done since her fallopian tube surgery in 2009. She will call Dr. Gaye Alken office to schedule with him.  Glucose intolerance - she habitually drinks lot of fluids even without thirst. Nocturia x 2-3.    Review of Systems     Objective:   Physical Exam Well-appearing, central obesity Chest clear Heart regular rhythm without murmur Abdomen soft without masses or tenderness No ankle edema       Assessment & Plan:

## 2012-02-11 NOTE — Assessment & Plan Note (Signed)
well controlled  

## 2012-02-11 NOTE — Assessment & Plan Note (Addendum)
Atypical, one episode. EKG normal.  She is to contact me if she has recurrences, but I don't believe another stress test is indicated at this point.

## 2012-02-14 ENCOUNTER — Other Ambulatory Visit: Payer: PRIVATE HEALTH INSURANCE

## 2012-02-14 DIAGNOSIS — D508 Other iron deficiency anemias: Secondary | ICD-10-CM

## 2012-02-14 DIAGNOSIS — R0789 Other chest pain: Secondary | ICD-10-CM

## 2012-02-14 DIAGNOSIS — I10 Essential (primary) hypertension: Secondary | ICD-10-CM

## 2012-02-14 LAB — COMPREHENSIVE METABOLIC PANEL
Albumin: 4 g/dL (ref 3.5–5.2)
Alkaline Phosphatase: 84 U/L (ref 39–117)
BUN: 18 mg/dL (ref 6–23)
CO2: 25 mEq/L (ref 19–32)
Calcium: 9 mg/dL (ref 8.4–10.5)
Chloride: 106 mEq/L (ref 96–112)
Glucose, Bld: 111 mg/dL — ABNORMAL HIGH (ref 70–99)
Potassium: 4.3 mEq/L (ref 3.5–5.3)
Total Protein: 6.6 g/dL (ref 6.0–8.3)

## 2012-02-14 LAB — LIPID PANEL
Cholesterol: 168 mg/dL (ref 0–200)
LDL Cholesterol: 112 mg/dL — ABNORMAL HIGH (ref 0–99)
VLDL: 19 mg/dL (ref 0–40)

## 2012-02-14 LAB — IRON AND TIBC
%SAT: 8 % — ABNORMAL LOW (ref 20–55)
Iron: 32 ug/dL — ABNORMAL LOW (ref 42–145)
UIBC: 384 ug/dL (ref 125–400)

## 2012-02-14 LAB — CBC
Hemoglobin: 9.6 g/dL — ABNORMAL LOW (ref 12.0–15.0)
Platelets: 332 10*3/uL (ref 150–400)
RBC: 4.03 MIL/uL (ref 3.87–5.11)
WBC: 4.9 10*3/uL (ref 4.0–10.5)

## 2012-02-14 NOTE — Progress Notes (Signed)
CMP,CBC,FLP AND TIBC DONE TODAY Adrienne Collins

## 2012-02-18 ENCOUNTER — Encounter: Payer: Self-pay | Admitting: Family Medicine

## 2012-03-10 ENCOUNTER — Ambulatory Visit: Payer: PRIVATE HEALTH INSURANCE | Admitting: Family Medicine

## 2012-03-18 ENCOUNTER — Encounter (HOSPITAL_COMMUNITY): Payer: Self-pay

## 2012-03-18 ENCOUNTER — Emergency Department (INDEPENDENT_AMBULATORY_CARE_PROVIDER_SITE_OTHER)
Admission: EM | Admit: 2012-03-18 | Discharge: 2012-03-18 | Disposition: A | Discharge status: Home / Self Care | Payer: Managed Care, Other (non HMO) | Source: Home / Self Care

## 2012-03-18 DIAGNOSIS — T148XXA Other injury of unspecified body region, initial encounter: Secondary | ICD-10-CM

## 2012-03-18 DIAGNOSIS — M549 Dorsalgia, unspecified: Secondary | ICD-10-CM

## 2012-03-18 MED ORDER — CYCLOBENZAPRINE HCL 10 MG PO TABS: 10.0000 mg | ORAL_TABLET | Freq: Two times a day (BID) | ORAL | Status: AC | PRN

## 2012-03-18 MED ORDER — NAPROXEN 500 MG PO TABS: 500.0000 mg | ORAL_TABLET | Freq: Two times a day (BID) | ORAL | Status: DC

## 2012-03-18 NOTE — ED Provider Notes (Signed)
History     CSN: [id number]  Arrival date & time 03/18/12  1750   None     Chief Complaint  Patient presents with  . Back Pain    (Consider location/radiation/quality/duration/timing/severity/associated sxs/prior treatment) The history is provided by the patient.   Complains of left mid back pain described as constant dull in nature that began today, noticed while in car driving to work. The pain is aggravated with certain positions.  No radiation into upper extremities.  No known injury noted.  Works as an Education officer, community.  No heaving lifting, no change in normal routine. Denies history of back problems. Has taken aleve for pain with no relief.  Denies urinary symptoms.  Continent of both bowel and bladder.    Past Medical History  Diagnosis Date  . History of ETT 08/2006    no sustained tachycardia  . S/P colonoscopy 10/27/2008    [place]    Past Surgical History  Procedure Date  . Cesarean section 1990  . Cesarean section 1991  . Salpingectomy     right    Family History  Problem Relation Age of Onset  . Diabetes Mother   . Hypertension Mother   . Diabetes Brother     History  Substance Use Topics  . Smoking status: Former Smoker    Quit date: 01/30/1994  . Smokeless tobacco: Not on file  . Alcohol Use: No     quit 1995    OB History    Grav Para Term Preterm Abortions TAB SAB Ect Mult Living                  Review of Systems  Respiratory: Negative.   Cardiovascular: Negative.   Gastrointestinal: Negative.   Genitourinary: Negative.   Musculoskeletal: Positive for back pain. Negative for myalgias, joint swelling, arthralgias and gait problem.  Skin: Negative.     Allergies  Review of patient's allergies indicates no known allergies.  Home Medications   Current Outpatient Rx  Name Route Sig Dispense Refill  . ACETAMINOPHEN ER 650 MG PO TBCR Oral Take 2 tablets (1,300 mg total) by mouth every 8 (eight) hours as needed for pain. take 2 tabs  every 6 hours as needed for pain 30 tablet prn  . ENALAPRIL-HYDROCHLOROTHIAZIDE 5-12.5 MG PO TABS  TAKE ONE TABLET BY MOUTH EVERY DAY 30 tablet 8  . METOPROLOL SUCCINATE ER 50 MG PO TB24  TAKE ONE TABLET BY MOUTH EVERY DAY 30 tablet 11  . CYCLOBENZAPRINE HCL 10 MG PO TABS Oral Take 1 tablet (10 mg total) by mouth 2 (two) times daily as needed for muscle spasms. 20 tablet 0  . NAPROXEN 500 MG PO TABS Oral Take 1 tablet (500 mg total) by mouth 2 (two) times daily. 30 tablet 0  . TRAMADOL HCL 50 MG PO TABS Oral Take 1 tablet (50 mg total) by mouth every 6 (six) hours as needed. 1-2 tabs for severe pain 30 tablet 2    BP 121/80  Pulse 62  Temp 98.2 F (36.8 C) (Oral)  Resp 18  SpO2 100%  LMP 03/09/2012  Physical Exam  Nursing note and vitals reviewed. Constitutional: She is oriented to person, place, and time. Vital signs are normal. She appears well-developed and well-nourished. She is active and cooperative.  HENT:  Head: Normocephalic.  Eyes: Conjunctivae are normal. Pupils are equal, round, and reactive to light. No scleral icterus.  Neck: Trachea normal, normal range of motion and full passive range of motion without  pain. Neck supple. No spinous process tenderness and no muscular tenderness present.  Cardiovascular: Normal rate, regular rhythm, normal heart sounds, intact distal pulses and normal pulses.   Pulmonary/Chest: Effort normal and breath sounds normal.  Musculoskeletal:       Cervical back: Normal.       Thoracic back: She exhibits tenderness and spasm. She exhibits normal range of motion, no bony tenderness, no swelling, no edema, no deformity and no pain.       Lumbar back: Normal.       Back:  Neurological: She is alert and oriented to person, place, and time. She has normal strength. No cranial nerve deficit or sensory deficit. Coordination and gait normal.       Bilateral hand grips equal, sensation intact distally.  Skin: Skin is warm and dry.  Psychiatric: She has  a normal mood and affect. Her speech is normal and behavior is normal. Judgment and thought content normal. Cognition and memory are normal.    ED Course  Procedures (including critical care time)  Labs Reviewed - No data to display No results found.   1. Muscle strain   2. Back pain       MDM  Rest, intermittent application of cold packs (later, may switch to heat, but do not sleep on heating pad), analgesics and muscle relaxants as recommended. Home back care exercise program with flexion exercise routine as instructed. Proper lifting with avoidance of heavy lifting discussed. Consider massage therapy and X-ray studies if not improving. Call or return to clinic prn if these symptoms worsen or fail to improve as anticipated. Imaging not indicated at this time.         Johnsie Kindred, NP 03/18/12 2040

## 2012-03-18 NOTE — ED Notes (Signed)
C/o onset this Am of pain in left shoulder, scapular area while going to work. States she did have brief nausea, and  Became concerned this could be her heart, since this is on the left side shoulder. Pain reproducible in shoulder, mid t-spine area w direct palpation ; NAD at present

## 2012-03-18 NOTE — ED Provider Notes (Signed)
Medical screening examination/treatment/procedure(s) were performed by non-physician practitioner and as supervising physician I was immediately available for consultation/collaboration.  Leslee Home, M.D.   Reuben Likes, MD 03/18/12 2125

## 2012-03-19 ENCOUNTER — Ambulatory Visit: Payer: PRIVATE HEALTH INSURANCE

## 2012-04-21 ENCOUNTER — Other Ambulatory Visit (HOSPITAL_COMMUNITY)
Admission: RE | Admit: 2012-04-21 | Discharge: 2012-04-21 | Disposition: A | Discharge status: Home / Self Care | Payer: Managed Care, Other (non HMO) | Source: Ambulatory Visit | Attending: Family Medicine | Admitting: Family Medicine

## 2012-04-21 ENCOUNTER — Ambulatory Visit (INDEPENDENT_AMBULATORY_CARE_PROVIDER_SITE_OTHER): Payer: Managed Care, Other (non HMO) | Admitting: Family Medicine

## 2012-04-21 ENCOUNTER — Encounter: Payer: Self-pay | Admitting: Family Medicine

## 2012-04-21 VITALS — BP 124/83 | HR 78 | Ht 62.0 in | Wt 205.0 lb

## 2012-04-21 DIAGNOSIS — E669 Obesity, unspecified: Secondary | ICD-10-CM

## 2012-04-21 DIAGNOSIS — N920 Excessive and frequent menstruation with regular cycle: Secondary | ICD-10-CM

## 2012-04-21 DIAGNOSIS — Z124 Encounter for screening for malignant neoplasm of cervix: Secondary | ICD-10-CM

## 2012-04-21 DIAGNOSIS — R002 Palpitations: Secondary | ICD-10-CM

## 2012-04-21 DIAGNOSIS — I10 Essential (primary) hypertension: Secondary | ICD-10-CM

## 2012-04-21 DIAGNOSIS — D508 Other iron deficiency anemias: Secondary | ICD-10-CM

## 2012-04-21 DIAGNOSIS — Z01419 Encounter for gynecological examination (general) (routine) without abnormal findings: Secondary | ICD-10-CM | POA: Insufficient documentation

## 2012-04-21 HISTORY — DX: Excessive and frequent menstruation with regular cycle: N92.0

## 2012-04-21 NOTE — Assessment & Plan Note (Signed)
Controlled by prn Metoprolol

## 2012-04-21 NOTE — Assessment & Plan Note (Signed)
Encouraged use of NSAIDS during menses. Take with Omeprazole if gastritis symptoms develop

## 2012-04-21 NOTE — Assessment & Plan Note (Signed)
Likely from her heavy menses. Consider GI source. Hasn't had a fair trial of response to iron.

## 2012-04-21 NOTE — Assessment & Plan Note (Signed)
unchanged

## 2012-04-21 NOTE — Progress Notes (Signed)
  Subjective:    Patient ID: Adrienne Collins, female    DOB: 12/23/1970, 41 y.o.   MRN: [id number]  HPIShe's happy to be able to get her pap smear here since she owe's her gyne $300  Menorrhagia - Menses with clots last 5-7 days. She takes Ibuprofen for cramps the first 2-3 days  Anemia - Only taking occasional ferrous sulfate. What she has expired in 2009 and she wonders if it is causing stomach upset. No signs of blood in stool.   Dyspepsia - occasional epigastric nausea, but no burning or reflux symptoms .  Back pain - recurrent. Last episode of back pain treated a [facility] with Alleve, which made her better. Didn't take Cyclobenzaprine.   Palpitations - takes metoprolol prn palpitation, which she did today.   Review of Systems     Objective:   Physical Exam  Constitutional:       Obese  Cardiovascular: Normal rate and regular rhythm.   Pulmonary/Chest: Effort normal and breath sounds normal.  Abdominal: There is no tenderness.  Neurological: She is alert.          Assessment & Plan:

## 2012-04-21 NOTE — Assessment & Plan Note (Signed)
well controlled  

## 2012-04-21 NOTE — Patient Instructions (Addendum)
Get new ferrous sulfate and take 2 of the 325 mg tablets daily  Do the stool hemocult cards and send in. If there is blood on the cards, I likely recommend taking an acid blocker for awhile  Stop the Ginger Ale and other sugar sweetened drinks.  Take the cyclobenzaprine at bedtime if needed for back spasm

## 2012-04-25 ENCOUNTER — Encounter: Payer: Self-pay | Admitting: Family Medicine

## 2012-04-27 NOTE — Progress Notes (Signed)
Patient ID: Adrienne Collins, female   DOB: [birth year], 41 y.o.   MRN: [id number] Pap and pelvic performed. No adnexal masses or tenderness. Normal external genitalia. No discharge. Normal bimanual exam of uterus.

## 2012-04-29 ENCOUNTER — Ambulatory Visit: Payer: Managed Care, Other (non HMO) | Admitting: Gastroenterology

## 2012-08-26 ENCOUNTER — Other Ambulatory Visit: Payer: Self-pay | Admitting: Family Medicine

## 2012-10-12 ENCOUNTER — Other Ambulatory Visit: Payer: Self-pay | Admitting: Family Medicine

## 2012-12-08 ENCOUNTER — Ambulatory Visit (INDEPENDENT_AMBULATORY_CARE_PROVIDER_SITE_OTHER): Payer: Managed Care, Other (non HMO) | Admitting: Family Medicine

## 2012-12-08 ENCOUNTER — Encounter: Payer: Self-pay | Admitting: Family Medicine

## 2012-12-08 VITALS — BP 110/69 | HR 76 | Ht 62.0 in | Wt 207.8 lb

## 2012-12-08 DIAGNOSIS — R7302 Impaired glucose tolerance (oral): Secondary | ICD-10-CM

## 2012-12-08 DIAGNOSIS — E559 Vitamin D deficiency, unspecified: Secondary | ICD-10-CM

## 2012-12-08 DIAGNOSIS — M2550 Pain in unspecified joint: Secondary | ICD-10-CM

## 2012-12-08 DIAGNOSIS — I10 Essential (primary) hypertension: Secondary | ICD-10-CM

## 2012-12-08 DIAGNOSIS — R7309 Other abnormal glucose: Secondary | ICD-10-CM

## 2012-12-08 DIAGNOSIS — E669 Obesity, unspecified: Secondary | ICD-10-CM

## 2012-12-08 DIAGNOSIS — D508 Other iron deficiency anemias: Secondary | ICD-10-CM

## 2012-12-08 HISTORY — DX: Impaired glucose tolerance (oral): R73.02

## 2012-12-08 LAB — CBC
MCHC: 32.9 g/dL (ref 30.0–36.0)
Platelets: 349 10*3/uL (ref 150–400)
RDW: 16.5 % — ABNORMAL HIGH (ref 11.5–15.5)
WBC: 6.5 10*3/uL (ref 4.0–10.5)

## 2012-12-08 NOTE — Assessment & Plan Note (Signed)
She realizes that weight loss will decrease joint pains

## 2012-12-08 NOTE — Assessment & Plan Note (Signed)
CBC. I encouraged her to find an iron supplement that she can tolerate.

## 2012-12-08 NOTE — Assessment & Plan Note (Signed)
well controlled  

## 2012-12-08 NOTE — Progress Notes (Signed)
  Subjective:    Patient ID: Adrienne Collins, female    DOB: 1971/08/10, 42 y.o.   MRN: [id number]  HPI Hip discomfort - Bilateral hip clicking without pain though they do hurt after walking long distances. She's been sitting more and gained a couple times since she is taking 4 classes at [employer] and still working full time.   Anemia - Menses not as heavy. Not taking the iron tablets regularly. It upset her stomach one time.  Fatigue - Very sleepy mid-mornings. Getting about 6 hours of sleep nightly due to her studies.   Hypertension - taking her medication regularly  Tremor - doesn't notice any.    Review of Systems     Objective:   Physical Exam  Constitutional:  Generalized obesity   Cardiovascular: Normal rate and regular rhythm.   Pulmonary/Chest: Effort normal and breath sounds normal.  Abdominal: Soft. She exhibits no distension and no mass. There is no tenderness. There is no guarding.  Musculoskeletal: She exhibits no edema.  I can hear a slight clicking when she walks. She's not antalgic in gait, squats and has full range of motion of hip and low back without pain.   Neurological: She is alert.  Psychiatric: She has a normal mood and affect. Her behavior is normal. Judgment and thought content normal.          Assessment & Plan:

## 2012-12-08 NOTE — Patient Instructions (Addendum)
I will be retiring in the summer of this year. Thank you for allowing me to work with you in maintaining your health. Donnella Sham, MD will be my replacement beginning the second week in August. He is moving to [place] having completed his family medicine residency training in [place], [place]. Please let me know if you would like to be assigned to a different physician.   I will let you know your lab results

## 2012-12-08 NOTE — Assessment & Plan Note (Signed)
She checks with a glucometer and capilllary blood glucose was 98 fasting and 109 later today.

## 2012-12-08 NOTE — Assessment & Plan Note (Signed)
Hips looked normal on LS spine films 2011 and exam today doesn't suggest significant hip or spine pathology.

## 2012-12-09 ENCOUNTER — Encounter: Payer: Self-pay | Admitting: Family Medicine

## 2012-12-09 DIAGNOSIS — E559 Vitamin D deficiency, unspecified: Secondary | ICD-10-CM

## 2012-12-09 HISTORY — DX: Vitamin D deficiency, unspecified: E55.9

## 2012-12-09 LAB — BASIC METABOLIC PANEL
BUN: 11 mg/dL (ref 6–23)
CO2: 23 mEq/L (ref 19–32)
Calcium: 9.2 mg/dL (ref 8.4–10.5)
Glucose, Bld: 104 mg/dL — ABNORMAL HIGH (ref 70–99)

## 2013-01-20 ENCOUNTER — Telehealth: Payer: Self-pay | Admitting: Family Medicine

## 2013-01-20 NOTE — Telephone Encounter (Signed)
Follow-up order for abnormality on screening mammorgram signed to fax to [facility]

## 2013-02-15 ENCOUNTER — Other Ambulatory Visit: Payer: Self-pay | Admitting: *Deleted

## 2013-02-15 MED ORDER — ENALAPRIL-HYDROCHLOROTHIAZIDE 5-12.5 MG PO TABS: ORAL_TABLET | ORAL | Status: DC

## 2013-04-01 ENCOUNTER — Other Ambulatory Visit: Payer: Self-pay | Admitting: Family Medicine

## 2013-04-08 ENCOUNTER — Encounter: Payer: Self-pay | Admitting: Family Medicine

## 2013-04-08 ENCOUNTER — Ambulatory Visit (INDEPENDENT_AMBULATORY_CARE_PROVIDER_SITE_OTHER): Payer: Managed Care, Other (non HMO) | Admitting: Family Medicine

## 2013-04-08 VITALS — BP 120/64 | HR 65 | Temp 98.8°F | Ht 62.0 in | Wt 201.9 lb

## 2013-04-08 DIAGNOSIS — R7302 Impaired glucose tolerance (oral): Secondary | ICD-10-CM

## 2013-04-08 DIAGNOSIS — E669 Obesity, unspecified: Secondary | ICD-10-CM

## 2013-04-08 DIAGNOSIS — D508 Other iron deficiency anemias: Secondary | ICD-10-CM

## 2013-04-08 DIAGNOSIS — R7309 Other abnormal glucose: Secondary | ICD-10-CM

## 2013-04-08 DIAGNOSIS — I10 Essential (primary) hypertension: Secondary | ICD-10-CM

## 2013-04-08 LAB — CBC
HCT: 32.4 % — ABNORMAL LOW (ref 36.0–46.0)
MCHC: 31.8 g/dL (ref 30.0–36.0)
MCV: 75.9 fL — ABNORMAL LOW (ref 78.0–100.0)
Platelets: 298 10*3/uL (ref 150–400)
RDW: 17.2 % — ABNORMAL HIGH (ref 11.5–15.5)
WBC: 5.7 10*3/uL (ref 4.0–10.5)

## 2013-04-08 LAB — COMPREHENSIVE METABOLIC PANEL
ALT: 22 U/L (ref 0–35)
AST: 24 U/L (ref 0–37)
Alkaline Phosphatase: 69 U/L (ref 39–117)
BUN: 13 mg/dL (ref 6–23)
Calcium: 9.5 mg/dL (ref 8.4–10.5)
Chloride: 103 mEq/L (ref 96–112)
Creat: 0.69 mg/dL (ref 0.50–1.10)
Potassium: 3.9 mEq/L (ref 3.5–5.3)

## 2013-04-08 LAB — POCT GLYCOSYLATED HEMOGLOBIN (HGB A1C): Hemoglobin A1C: 6.2

## 2013-04-08 LAB — LIPID PANEL
HDL: 42 mg/dL (ref >39)
LDL Cholesterol: 104 mg/dL — ABNORMAL HIGH (ref 0–99)
Total CHOL/HDL Ratio: 4 Ratio
Triglycerides: 108 mg/dL (ref <150)
VLDL: 22 mg/dL (ref 0–40)

## 2013-04-08 NOTE — Assessment & Plan Note (Signed)
Hasn't been able to tolerate iron, will check CBC

## 2013-04-08 NOTE — Assessment & Plan Note (Signed)
Avoidance of sugar sweetened drinks and other carbs was discussed.

## 2013-04-08 NOTE — Assessment & Plan Note (Signed)
Still in glucose intolerance range, but approaching diabetes  Nutrition center referral made and exercise goals set.

## 2013-04-08 NOTE — Progress Notes (Signed)
  Subjective:    Patient ID: Adrienne Collins, female    DOB: [birth year], 42 y.o.   MRN: [id number]  HPI Diabetes - checked with a glucometer one hour after large lunch and it was 211. Fasting capilllary blood glucose's 129 and this AM 117. Nocturia 0-1 but polyuria and polydipsia in the daytime.   Arthralgias -mid-back, shoulders and feet. Improves quickly with exercise.   Obesity - Not getting exercise for more than a few minutes.  Review of Systems     Objective:   Physical Exam  Constitutional:  Generalized obesity   Neck: No thyromegaly present.  Cardiovascular: Normal rate and regular rhythm.   No murmur heard. Pulmonary/Chest: Effort normal and breath sounds normal.  Musculoskeletal: She exhibits no edema.  Psychiatric: She has a normal mood and affect. Her behavior is normal. Judgment and thought content normal.          Assessment & Plan:

## 2013-04-08 NOTE — Assessment & Plan Note (Signed)
well controlled  

## 2013-04-09 ENCOUNTER — Encounter: Payer: Self-pay | Admitting: Family Medicine

## 2013-04-21 ENCOUNTER — Encounter: Payer: Managed Care, Other (non HMO) | Attending: Family Medicine | Admitting: *Deleted

## 2013-04-21 ENCOUNTER — Encounter: Payer: Self-pay | Admitting: *Deleted

## 2013-04-21 VITALS — Ht 62.0 in | Wt 201.0 lb

## 2013-04-21 DIAGNOSIS — R7302 Impaired glucose tolerance (oral): Secondary | ICD-10-CM

## 2013-04-21 DIAGNOSIS — R7309 Other abnormal glucose: Secondary | ICD-10-CM | POA: Insufficient documentation

## 2013-04-21 DIAGNOSIS — E669 Obesity, unspecified: Secondary | ICD-10-CM | POA: Insufficient documentation

## 2013-04-21 DIAGNOSIS — I10 Essential (primary) hypertension: Secondary | ICD-10-CM

## 2013-04-21 DIAGNOSIS — Z713 Dietary counseling and surveillance: Secondary | ICD-10-CM | POA: Insufficient documentation

## 2013-04-21 NOTE — Progress Notes (Signed)
  Medical Nutrition Therapy:  Appt start time: 0800 end time:  0900.  Assessment:  Primary concerns today: Adrienne Collins is here for nutrition counseling pertaining to pre-diabetes.  Her recent HbA1c is 6.2%.  She has an extensive family history of diabetes.  She is obese, has HTN, and dyslipidemia.   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    MEDICATIONS: see list.  Nothing for hyperglycemia   DIETARY INTAKE:  Usual eating pattern includes 2-3 meals and multiple snacks per day.  Everyday foods include protein, starches, vegetables.  Avoided foods include none.    24-hr recall: "Daniel fast" for 1 week B ( AM): skips most of the time.  Might have McDonald's sausage burrito or sausage mcmuffin.  Might have boiled egg  Snk ( AM): sometimes  L ( PM): [place] food- chicken with rice Snk ( PM): veggie sticks with salt or pretzels, pocorn, candy sometimes, yogurt D ( PM): baked chicken and wing or leg with vegetables Snk ( PM): peanut butter, pickles Beverages: unsweet tea, water, mineral water, diet soda.  Sometimes sweet tea or regular soda.  Sometimes juice  Usual physical activity: moves at work, but nothing outside of working  Estimated energy needs: 1600 calories 180 g carbohydrates 120 g protein 44 g fat    Nutritional Diagnosis:  NB-1.5 Disordered eating pattern As related to meal skipping and disproporation balance of macronutrients.  As evidenced by obesity, HTN, dyslipidemia, and prediabetes.    Intervention:  Nutrition counseling provided.  Discussed diabetes disease process and treatment options.  Discussed physiology of diabetes and role of obesity on insulin resistance.  Encouraged moderate weight reduction to improve glucose levels.  Discussed role of medications and diet in glucose control  Provided education on macronutrients on glucose levels.  Provided education on carb counting, importance of regularly scheduled meals/snacks, and meal  planning  Discussed effects of physical activity on glucose levels and long-term glucose control.  Recommended 150 minutes of physical activity/week.  Discussed blood glucose monitoring and interpretation.  Discussed recommended target ranges and individual ranges.    Described short-term complications: hyper- and hypo-glycemia.  Discussed causes,symptoms, and treatment options.  Discussed role of stress on blood glucose levels and discussed strategies to manage psychosocial issues.  Goals: Aim for 3 meals/day and avoid meal skipping Follow MyPlate recommendations: choose more free veggies and lean proteins, less carbohydrates Move more!  Walk 20 minutes 3 days/week Make time to relax   Handouts given during visit include:  Living well with diabetes  Monitoring/Evaluation:  Dietary intake, exercise, BGM, and body weight in 3 month(s).

## 2013-06-14 ENCOUNTER — Encounter: Payer: Self-pay | Admitting: Family Medicine

## 2013-06-14 ENCOUNTER — Ambulatory Visit (INDEPENDENT_AMBULATORY_CARE_PROVIDER_SITE_OTHER): Payer: Managed Care, Other (non HMO) | Admitting: Family Medicine

## 2013-06-14 VITALS — BP 103/68 | HR 62 | Temp 98.2°F | Ht 62.0 in | Wt 203.0 lb

## 2013-06-14 DIAGNOSIS — M62830 Muscle spasm of back: Secondary | ICD-10-CM | POA: Insufficient documentation

## 2013-06-14 DIAGNOSIS — M5380 Other specified dorsopathies, site unspecified: Secondary | ICD-10-CM

## 2013-06-14 HISTORY — DX: Muscle spasm of back: M62.830

## 2013-06-14 NOTE — Assessment & Plan Note (Signed)
Acute left-sided paraspinal lumbar muscle spasm, no acute neurologic signs -Conservative management with NSAIDs and heat

## 2013-06-14 NOTE — Progress Notes (Signed)
  Subjective:    Patient ID: Adrienne Collins, female    DOB: 1970-10-12, 42 y.o.   MRN: [id number]  HPI 42 year old female presents for evaluation of back spasm, she states that yesterday she was sitting in church and turned to her right, she had acute muscle spasm in her left lower back, she has not attempted any ice or heat to the area, she has taken some ibuprofen as well as Tylenol which provided minimal relief, she is currently asymptomatic, denies bowel or bladder dysfunction, no numbness or tingling in her lower extremities, no weakness, patient is currently asymptomatic    Review of Systems  Constitutional: Negative for fever, chills and fatigue.  Respiratory: Negative for chest tightness.   Cardiovascular: Negative for chest pain.  Musculoskeletal: Positive for back pain. Negative for gait problem, neck pain and neck stiffness.       Objective:   Physical Exam Vitals: Reviewed General: Pleasant African American female, no acute distress Cardiac: Regular in rhythm, S1 and S2 present, no murmurs Respiratory: Clear to patient bilaterally, normal effort MSK: Minimal left-sided lumbar paraspinal tenderness, no midline tenderness, range of motion of the trunk is within normal limits and without restriction Neuro: Strength is 5 out of 5 to bilateral hip flexion as well as bilateral knee flexion and extension, sensation to light-touch is grossly intact in bilateral lower extremities, straight leg test is negative bilaterally       Assessment & Plan:  Please see problem specific assessment and plan.

## 2013-06-14 NOTE — Patient Instructions (Signed)
Back Spasm - apply heat to the affected area for 10-15 minutes at a time, 2-3 times per day as needed, may take ibuprofen and tylenol as needed  Return before Christmas to discuss blood sugar and weight.

## 2013-06-22 ENCOUNTER — Telehealth: Payer: Self-pay | Admitting: Family Medicine

## 2013-06-22 NOTE — Telephone Encounter (Signed)
Pt called because she needs a referral for her 6 month f/u with her obgyn. jw

## 2013-06-23 NOTE — Telephone Encounter (Signed)
Will FWD to MD for referral to be placed.  Evangelynn Lochridge, Darlyne Russian, CMA

## 2013-06-23 NOTE — Telephone Encounter (Signed)
Patient returns Dr. Versie Starks call. Unable to locate Dr. Randolm Idol, told patient to expect call back tomorrow.

## 2013-06-23 NOTE — Telephone Encounter (Signed)
Left message on patient's phone. I need more information before I can make this referral. Will attempt to call patient again tomorrow.

## 2013-06-23 NOTE — Telephone Encounter (Signed)
Spoke with patient, referral needed along with an order for follow up mammogram for [facility] due to patient being being followed every 6 months for spot found on mammogram.  Serapio Edelson, Darlyne Russian, CMA

## 2013-06-24 NOTE — Telephone Encounter (Signed)
No answer on home phone. Left message that I will call again on 06/25/13 in the AM. If she is going to be at work I told her to call our office and leave her cell phone number for [place].

## 2013-07-01 ENCOUNTER — Other Ambulatory Visit: Payer: Self-pay | Admitting: Family Medicine

## 2013-07-01 DIAGNOSIS — R928 Other abnormal and inconclusive findings on diagnostic imaging of breast: Secondary | ICD-10-CM

## 2013-07-01 HISTORY — DX: Other abnormal and inconclusive findings on diagnostic imaging of breast: R92.8

## 2013-07-01 NOTE — Telephone Encounter (Signed)
Discussed need for diagnostic mammogram. Faxed order for mammogram over to [facility].

## 2013-07-12 ENCOUNTER — Ambulatory Visit: Payer: Managed Care, Other (non HMO) | Admitting: Family Medicine

## 2013-07-16 ENCOUNTER — Ambulatory Visit (INDEPENDENT_AMBULATORY_CARE_PROVIDER_SITE_OTHER): Payer: Managed Care, Other (non HMO) | Admitting: Family Medicine

## 2013-07-16 VITALS — BP 105/68 | HR 98 | Temp 98.3°F | Ht 62.0 in | Wt 203.0 lb

## 2013-07-16 DIAGNOSIS — E669 Obesity, unspecified: Secondary | ICD-10-CM

## 2013-07-16 DIAGNOSIS — Z01419 Encounter for gynecological examination (general) (routine) without abnormal findings: Secondary | ICD-10-CM | POA: Insufficient documentation

## 2013-07-16 DIAGNOSIS — Z124 Encounter for screening for malignant neoplasm of cervix: Secondary | ICD-10-CM

## 2013-07-16 DIAGNOSIS — D508 Other iron deficiency anemias: Secondary | ICD-10-CM

## 2013-07-16 DIAGNOSIS — Z0000 Encounter for general adult medical examination without abnormal findings: Secondary | ICD-10-CM | POA: Insufficient documentation

## 2013-07-16 LAB — CBC
MCHC: 32.3 g/dL (ref 30.0–36.0)
Platelets: 303 10*3/uL (ref 150–400)
RDW: 16.4 % — ABNORMAL HIGH (ref 11.5–15.5)
WBC: 6.2 10*3/uL (ref 4.0–10.5)

## 2013-07-16 NOTE — Assessment & Plan Note (Signed)
History of iron deficiency anemia. Currently asymptomatic. Not compliant with iron supplementation. -check CBC

## 2013-07-16 NOTE — Progress Notes (Signed)
Patient ID: Adrienne Collins, female   DOB: 1970/09/27, 42 y.o.   MRN: [id number] 42 y.o. year old female presents for well woman/preventative visit and annual GYN examination.  Acute Concerns: No acute concerns identified. Her father was recently in the hospital for CHF exacerbation however has since returned home.   Diet: reports poor portion control, 2-3 servings of vegetables per day, 2-3 servings of fruit per day, drinks diet soda, 1-2 servings of dairy per day  Exercise: does not exercise regularly  Sexual/Birth History: not currently sexually active  Birth Control: not currently on birth control as not sexually active  Social:  History   Social History  . Marital Status: Single    Spouse Name: N/A    Number of Children: 2  . Years of Education: 25 GED   Occupational History  . NURSING ASSIST    Social History Main Topics  . Smoking status: Former Smoker    Quit date: 01/30/1994  . Smokeless tobacco: Not on file  . Alcohol Use: No     Comment: quit 1995  . Drug Use: No  . Sexual Activity: Not Currently   Other Topics Concern  . Not on file   Social History Narrative   Never married, celebate since 2000   Son, [place], odd jobs, living at home   Son, Education officer, community, Consulting civil engineer at [employer], living at home   [place] GED since 01/2012, CMA and Med [employer]   Employed at [employer]   Attends [employer]:  Tdap/TD: 2006  Influenza: 2014 (at work)  Cancer Screening:  Pap Smear: 2013 (all previous pap smears negative)  Mammogram: 01/2013 (3.3 mm mass in the right breast (outer upper quadrant), scheduled for repeat mammogram this month; no family history of breast cancer  Colonoscopy: 2010 due to rectal bleeding (diverticula and small polyps), no family history of breast cancer    Physical Exam: VITALS: reviewed QMV:HQIONGEX AAF, NAD HEENT: normocephalic, PERRL, EOMI, MMM, uvula midline, no pharyngeal erythema, neck supple, no  thyromegaly, no adenopathy CARDIAC: RRR, S1 and S2 present, no murmurs, no heaves/thrills RESP: CTAB, normal effort BREAST:Exam performed in the presence of a chaperone. Fibrocystic breasts, no masses, no nipple discharge, no significant axillary lymptadenopathy ABD: obese, soft, no tenderness, normal bowel sounds, no rebound, no guarding GU/GYN:Exam performed in the presence of a chaperone. Bimanual exam performed and technically difficult due to obesity, no masses identifed EXT: no edema, 2+ radial pulses SKIN: no rash, no abnormal skin lesion.   ASSESSMENT & PLAN: 42 y.o. female presents for annual well woman/preventative exam and GYN exam. Please see problem specific assessment and plan.

## 2013-07-16 NOTE — Assessment & Plan Note (Signed)
42 y/o female presents for annual well visit. -Up to date on cancer screening ( will need Pap Smear in 2016, has repeat mammogram this month due to new right breast mass) -Up to date on immunizations -Does not require STD check as currently not sexually active -Initiated discussion on living will/DPOA -Discussed initiating regular exercise routine -Start daily multivitamin

## 2013-07-16 NOTE — Patient Instructions (Signed)
Preventive Care for Adults, Female A healthy lifestyle and preventive care can promote health and wellness. Preventive health guidelines for women include the following key practices.  A routine yearly physical is a good way to check with your caregiver about your health and preventive screening. It is a chance to share any concerns and updates on your health, and to receive a thorough exam.  Visit your dentist for a routine exam and preventive care every 6 months. Brush your teeth twice a day and floss once a day. Good oral hygiene prevents tooth decay and gum disease.  The frequency of eye exams is based on your age, health, family medical history, use of contact lenses, and other factors. Follow your caregiver's recommendations for frequency of eye exams.  Eat a healthy diet. Foods like vegetables, fruits, whole grains, low-fat dairy products, and lean protein foods contain the nutrients you need without too many calories. Decrease your intake of foods high in solid fats, added sugars, and salt. Eat the right amount of calories for you.Get information about a proper diet from your caregiver, if necessary.  Regular physical exercise is one of the most important things you can do for your health. Most adults should get at least 150 minutes of moderate-intensity exercise (any activity that increases your heart rate and causes you to sweat) each week. In addition, most adults need muscle-strengthening exercises on 2 or more days a week.  Maintain a healthy weight. The body mass index (BMI) is a screening tool to identify possible weight problems. It provides an estimate of body fat based on height and weight. Your caregiver can help determine your BMI, and can help you achieve or maintain a healthy weight.For adults 20 years and older:  A BMI below 18.5 is considered underweight.  A BMI of 18.5 to 24.9 is normal.  A BMI of 25 to 29.9 is considered overweight.  A BMI of 30 and above is  considered obese.  Maintain normal blood lipids and cholesterol levels by exercising and minimizing your intake of saturated fat. Eat a balanced diet with plenty of fruit and vegetables. Blood tests for lipids and cholesterol should begin at age 20 and be repeated every 5 years. If your lipid or cholesterol levels are high, you are over 50, or you are at high risk for heart disease, you may need your cholesterol levels checked more frequently.Ongoing high lipid and cholesterol levels should be treated with medicines if diet and exercise are not effective.  If you smoke, find out from your caregiver how to quit. If you do not use tobacco, do not start.  Lung cancer screening is recommended for adults aged 55 80 years who are at high risk for developing lung cancer because of a history of smoking. Yearly low-dose computed tomography (CT) is recommended for people who have at least a 30-pack-year history of smoking and are a current smoker or have quit within the past 15 years. A pack year of smoking is smoking an average of 1 pack of cigarettes a day for 1 year (for example: 1 pack a day for 30 years or 2 packs a day for 15 years). Yearly screening should continue until the smoker has stopped smoking for at least 15 years. Yearly screening should also be stopped for people who develop a health problem that would prevent them from having lung cancer treatment.  If you are pregnant, do not drink alcohol. If you are breastfeeding, be very cautious about drinking alcohol. If you are   not pregnant and choose to drink alcohol, do not exceed 1 drink per day. One drink is considered to be 12 ounces (355 mL) of beer, 5 ounces (148 mL) of wine, or 1.5 ounces (44 mL) of liquor.  Avoid use of street drugs. Do not share needles with anyone. Ask for help if you need support or instructions about stopping the use of drugs.  High blood pressure causes heart disease and increases the risk of stroke. Your blood pressure  should be checked at least every 1 to 2 years. Ongoing high blood pressure should be treated with medicines if weight loss and exercise are not effective.  If you are 55 to 41 years old, ask your caregiver if you should take aspirin to prevent strokes.  Diabetes screening involves taking a blood sample to check your fasting blood sugar level. This should be done once every 3 years, after age 45, if you are within normal weight and without risk factors for diabetes. Testing should be considered at a younger age or be carried out more frequently if you are overweight and have at least 1 risk factor for diabetes.  Breast cancer screening is essential preventive care for women. You should practice "breast self-awareness." This means understanding the normal appearance and feel of your breasts and may include breast self-examination. Any changes detected, no matter how small, should be reported to a caregiver. Women in their 20s and 30s should have a clinical breast exam (CBE) by a caregiver as part of a regular health exam every 1 to 3 years. After age 40, women should have a CBE every year. Starting at age 40, women should consider having a mammography (breast X-ray test) every year. Women who have a family history of breast cancer should talk to their caregiver about genetic screening. Women at a high risk of breast cancer should talk to their caregivers about having magnetic resonance imaging (MRI) and a mammography every year.  Breast cancer gene (BRCA)-related cancer risk assessment is recommended for women who have family members with BRCA-related cancers. BRCA-related cancers include breast, ovarian, tubal, and peritoneal cancers. Having family members with these cancers may be associated with an increased risk for harmful changes (mutations) in the breast cancer genes BRCA1 and BRCA2. Results of the assessment will determine the need for genetic counseling and BRCA1 and BRCA2 testing.  The Pap test is  a screening test for cervical cancer. A Pap test can show cell changes on the cervix that might become cervical cancer if left untreated. A Pap test is a procedure in which cells are obtained and examined from the lower end of the uterus (cervix).  Women should have a Pap test starting at age 21.  Between ages 21 and 29, Pap tests should be repeated every 2 years.  Beginning at age 30, you should have a Pap test every 3 years as long as the past 3 Pap tests have been normal.  Some women have medical problems that increase the chance of getting cervical cancer. Talk to your caregiver about these problems. It is especially important to talk to your caregiver if a new problem develops soon after your last Pap test. In these cases, your caregiver may recommend more frequent screening and Pap tests.  The above recommendations are the same for women who have or have not gotten the vaccine for human papillomavirus (HPV).  If you had a hysterectomy for a problem that was not cancer or a condition that could lead to cancer, then   you no longer need Pap tests. Even if you no longer need a Pap test, a regular exam is a good idea to make sure no other problems are starting.  If you are between ages 65 and 70, and you have had normal Pap tests going back 10 years, you no longer need Pap tests. Even if you no longer need a Pap test, a regular exam is a good idea to make sure no other problems are starting.  If you have had past treatment for cervical cancer or a condition that could lead to cancer, you need Pap tests and screening for cancer for at least 20 years after your treatment.  If Pap tests have been discontinued, risk factors (such as a new sexual partner) need to be reassessed to determine if screening should be resumed.  The HPV test is an additional test that may be used for cervical cancer screening. The HPV test looks for the virus that can cause the cell changes on the cervix. The cells collected  during the Pap test can be tested for HPV. The HPV test could be used to screen women aged 30 years and older, and should be used in women of any age who have unclear Pap test results. After the age of 30, women should have HPV testing at the same frequency as a Pap test.  Colorectal cancer can be detected and often prevented. Most routine colorectal cancer screening begins at the age of 50 and continues through age 75. However, your caregiver may recommend screening at an earlier age if you have risk factors for colon cancer. On a yearly basis, your caregiver may provide home test kits to check for hidden blood in the stool. Use of a small camera at the end of a tube, to directly examine the colon (sigmoidoscopy or colonoscopy), can detect the earliest forms of colorectal cancer. Talk to your caregiver about this at age 50, when routine screening begins. Direct examination of the colon should be repeated every 5 to 10 years through age 75, unless early forms of pre-cancerous polyps or small growths are found.  Hepatitis C blood testing is recommended for all people born from 1945 through 1965 and any individual with known risks for hepatitis C.  Practice safe sex. Use condoms and avoid high-risk sexual practices to reduce the spread of sexually transmitted infections (STIs). STIs include gonorrhea, chlamydia, syphilis, trichomonas, herpes, HPV, and human immunodeficiency virus (HIV). Herpes, HIV, and HPV are viral illnesses that have no cure. They can result in disability, cancer, and death. Sexually active women aged 25 and younger should be checked for chlamydia. Older women with new or multiple partners should also be tested for chlamydia. Testing for other STIs is recommended if you are sexually active and at increased risk.  Osteoporosis is a disease in which the bones lose minerals and strength with aging. This can result in serious bone fractures. The risk of osteoporosis can be identified using a  bone density scan. Women ages 65 and over and women at risk for fractures or osteoporosis should discuss screening with their caregivers. Ask your caregiver whether you should take a calcium supplement or vitamin D to reduce the rate of osteoporosis.  Menopause can be associated with physical symptoms and risks. Hormone replacement therapy is available to decrease symptoms and risks. You should talk to your caregiver about whether hormone replacement therapy is right for you.  Use sunscreen. Apply sunscreen liberally and repeatedly throughout the day. You should seek shade   when your shadow is shorter than you. Protect yourself by wearing long sleeves, pants, a wide-brimmed hat, and sunglasses year round, whenever you are outdoors.  Once a month, do a whole body skin exam, using a mirror to look at the skin on your back. Notify your caregiver of new moles, moles that have irregular borders, moles that are larger than a pencil eraser, or moles that have changed in shape or color.  Stay current with required immunizations.  Influenza vaccine. All adults should be immunized every year.  Tetanus, diphtheria, and acellular pertussis (Td, Tdap) vaccine. Pregnant women should receive 1 dose of Tdap vaccine during each pregnancy. The dose should be obtained regardless of the length of time since the last dose. Immunization is preferred during the 27th to 36th week of gestation. An adult who has not previously received Tdap or who does not know her vaccine status should receive 1 dose of Tdap. This initial dose should be followed by tetanus and diphtheria toxoids (Td) booster doses every 10 years. Adults with an unknown or incomplete history of completing a 3-dose immunization series with Td-containing vaccines should begin or complete a primary immunization series including a Tdap dose. Adults should receive a Td booster every 10 years.  Varicella vaccine. An adult without evidence of immunity to varicella  should receive 2 doses or a second dose if she has previously received 1 dose. Pregnant females who do not have evidence of immunity should receive the first dose after pregnancy. This first dose should be obtained before leaving the health care facility. The second dose should be obtained 4 8 weeks after the first dose.  Human papillomavirus (HPV) vaccine. Females aged 13 26 years who have not received the vaccine previously should obtain the 3-dose series. The vaccine is not recommended for use in pregnant females. However, pregnancy testing is not needed before receiving a dose. If a female is found to be pregnant after receiving a dose, no treatment is needed. In that case, the remaining doses should be delayed until after the pregnancy. Immunization is recommended for any person with an immunocompromised condition through the age of 26 years if she did not get any or all doses earlier. During the 3-dose series, the second dose should be obtained 4 8 weeks after the first dose. The third dose should be obtained 24 weeks after the first dose and 16 weeks after the second dose.  Zoster vaccine. One dose is recommended for adults aged 60 years or older unless certain conditions are present.  Measles, mumps, and rubella (MMR) vaccine. Adults born before 1957 generally are considered immune to measles and mumps. Adults born in 1957 or later should have 1 or more doses of MMR vaccine unless there is a contraindication to the vaccine or there is laboratory evidence of immunity to each of the three diseases. A routine second dose of MMR vaccine should be obtained at least 28 days after the first dose for students attending postsecondary schools, health care workers, or international travelers. People who received inactivated measles vaccine or an unknown type of measles vaccine during 1963 1967 should receive 2 doses of MMR vaccine. People who received inactivated mumps vaccine or an unknown type of mumps vaccine  before 1979 and are at high risk for mumps infection should consider immunization with 2 doses of MMR vaccine. For females of childbearing age, rubella immunity should be determined. If there is no evidence of immunity, females who are not pregnant should be vaccinated. If there   is no evidence of immunity, females who are pregnant should delay immunization until after pregnancy. Unvaccinated health care workers born before 1957 who lack laboratory evidence of measles, mumps, or rubella immunity or laboratory confirmation of disease should consider measles and mumps immunization with 2 doses of MMR vaccine or rubella immunization with 1 dose of MMR vaccine.  Pneumococcal 13-valent conjugate (PCV13) vaccine. When indicated, a person who is uncertain of her immunization history and has no record of immunization should receive the PCV13 vaccine. An adult aged 19 years or older who has certain medical conditions and has not been previously immunized should receive 1 dose of PCV13 vaccine. This PCV13 should be followed with a dose of pneumococcal polysaccharide (PPSV23) vaccine. The PPSV23 vaccine dose should be obtained at least 8 weeks after the dose of PCV13 vaccine. An adult aged 19 years or older who has certain medical conditions and previously received 1 or more doses of PPSV23 vaccine should receive 1 dose of PCV13. The PCV13 vaccine dose should be obtained 1 or more years after the last PPSV23 vaccine dose.  Pneumococcal polysaccharide (PPSV23) vaccine. When PCV13 is also indicated, PCV13 should be obtained first. All adults aged 65 years and older should be immunized. An adult younger than age 65 years who has certain medical conditions should be immunized. Any person who resides in a nursing home or long-term care facility should be immunized. An adult smoker should be immunized. People with an immunocompromised condition and certain other conditions should receive both PCV13 and PPSV23 vaccines. People  with human immunodeficiency virus (HIV) infection should be immunized as soon as possible after diagnosis. Immunization during chemotherapy or radiation therapy should be avoided. Routine use of PPSV23 vaccine is not recommended for American Indians, [place] Natives, or people younger than 65 years unless there are medical conditions that require PPSV23 vaccine. When indicated, people who have unknown immunization and have no record of immunization should receive PPSV23 vaccine. One-time revaccination 5 years after the first dose of PPSV23 is recommended for people aged 19 64 years who have chronic kidney failure, nephrotic syndrome, asplenia, or immunocompromised conditions. People who received 1 2 doses of PPSV23 before age 65 years should receive another dose of PPSV23 vaccine at age 65 years or later if at least 5 years have passed since the previous dose. Doses of PPSV23 are not needed for people immunized with PPSV23 at or after age 65 years.  Meningococcal vaccine. Adults with asplenia or persistent complement component deficiencies should receive 2 doses of quadrivalent meningococcal conjugate (MenACWY-D) vaccine. The doses should be obtained at least 2 months apart. Microbiologists working with certain meningococcal bacteria, military recruits, people at risk during an outbreak, and people who travel to or live in countries with a high rate of meningitis should be immunized. A first-year college student up through age 21 years who is living in a residence hall should receive a dose if she did not receive a dose on or after her 16th birthday. Adults who have certain high-risk conditions should receive one or more doses of vaccine.  Hepatitis A vaccine. Adults who wish to be protected from this disease, have certain high-risk conditions, work with hepatitis A-infected animals, work in hepatitis A research labs, or travel to or work in countries with a high rate of hepatitis A should be immunized. Adults  who were previously unvaccinated and who anticipate close contact with an international adoptee during the first 60 days after arrival in the [place] from a country   with a high rate of hepatitis A should be immunized.  Hepatitis B vaccine. Adults who wish to be protected from this disease, have certain high-risk conditions, may be exposed to blood or other infectious body fluids, are household contacts or sex partners of hepatitis B positive people, are clients or workers in certain care facilities, or travel to or work in countries with a high rate of hepatitis B should be immunized.  Haemophilus influenzae type b (Hib) vaccine. A previously unvaccinated person with asplenia or sickle cell disease or having a scheduled splenectomy should receive 1 dose of Hib vaccine. Regardless of previous immunization, a recipient of a hematopoietic stem cell transplant should receive a 3-dose series 6 12 months after her successful transplant. Hib vaccine is not recommended for adults with HIV infection. Preventive Services / Frequency Ages 19 to 39  Blood pressure check.** / Every 1 to 2 years.  Lipid and cholesterol check.** / Every 5 years beginning at age 20.  Clinical breast exam.** / Every 3 years for women in their 20s and 30s.  BRCA-related cancer risk assessment.** / For women who have family members with a BRCA-related cancer (breast, ovarian, tubal, or peritoneal cancers).  Pap test.** / Every 2 years from ages 21 through 29. Every 3 years starting at age 30 through age 65 or 70 with a history of 3 consecutive normal Pap tests.  HPV screening.** / Every 3 years from ages 30 through ages 65 to 70 with a history of 3 consecutive normal Pap tests.  Hepatitis C blood test.** / For any individual with known risks for hepatitis C.  Skin self-exam. / Monthly.  Influenza vaccine. / Every year.  Tetanus, diphtheria, and acellular pertussis (Tdap, Td) vaccine.** / Consult your caregiver. Pregnant  women should receive 1 dose of Tdap vaccine during each pregnancy. 1 dose of Td every 10 years.  Varicella vaccine.** / Consult your caregiver. Pregnant females who do not have evidence of immunity should receive the first dose after pregnancy.  HPV vaccine. / 3 doses over 6 months, if 26 and younger. The vaccine is not recommended for use in pregnant females. However, pregnancy testing is not needed before receiving a dose.  Measles, mumps, rubella (MMR) vaccine.** / You need at least 1 dose of MMR if you were born in 1957 or later. You may also need a 2nd dose. For females of childbearing age, rubella immunity should be determined. If there is no evidence of immunity, females who are not pregnant should be vaccinated. If there is no evidence of immunity, females who are pregnant should delay immunization until after pregnancy.  Pneumococcal 13-valent conjugate (PCV13) vaccine.** / Consult your caregiver.  Pneumococcal polysaccharide (PPSV23) vaccine.** / 1 to 2 doses if you smoke cigarettes or if you have certain conditions.  Meningococcal vaccine.** / 1 dose if you are age 19 to 21 years and a first-year college student living in a residence hall, or have one of several medical conditions, you need to get vaccinated against meningococcal disease. You may also need additional booster doses.  Hepatitis A vaccine.** / Consult your caregiver.  Hepatitis B vaccine.** / Consult your caregiver.  Haemophilus influenzae type b (Hib) vaccine.** / Consult your caregiver. Ages 40 to 64  Blood pressure check.** / Every 1 to 2 years.  Lipid and cholesterol check.** / Every 5 years beginning at age 20.  Lung cancer screening. / Every year if you are aged 55 80 years and have a 30-pack-year history of smoking and   currently smoke or have quit within the past 15 years. Yearly screening is stopped once you have quit smoking for at least 15 years or develop a health problem that would prevent you from having  lung cancer treatment.  Clinical breast exam.** / Every year after age 40.  BRCA-related cancer risk assessment.** / For women who have family members with a BRCA-related cancer (breast, ovarian, tubal, or peritoneal cancers).  Mammogram.** / Every year beginning at age 40 and continuing for as long as you are in good health. Consult with your caregiver.  Pap test.** / Every 3 years starting at age 30 through age 65 or 70 with a history of 3 consecutive normal Pap tests.  HPV screening.** / Every 3 years from ages 30 through ages 65 to 70 with a history of 3 consecutive normal Pap tests.  Fecal occult blood test (FOBT) of stool. / Every year beginning at age 50 and continuing until age 75. You may not need to do this test if you get a colonoscopy every 10 years.  Flexible sigmoidoscopy or colonoscopy.** / Every 5 years for a flexible sigmoidoscopy or every 10 years for a colonoscopy beginning at age 50 and continuing until age 75.  Hepatitis C blood test.** / For all people born from 1945 through 1965 and any individual with known risks for hepatitis C.  Skin self-exam. / Monthly.  Influenza vaccine. / Every year.  Tetanus, diphtheria, and acellular pertussis (Tdap/Td) vaccine.** / Consult your caregiver. Pregnant women should receive 1 dose of Tdap vaccine during each pregnancy. 1 dose of Td every 10 years.  Varicella vaccine.** / Consult your caregiver. Pregnant females who do not have evidence of immunity should receive the first dose after pregnancy.  Zoster vaccine.** / 1 dose for adults aged 60 years or older.  Measles, mumps, rubella (MMR) vaccine.** / You need at least 1 dose of MMR if you were born in 1957 or later. You may also need a 2nd dose. For females of childbearing age, rubella immunity should be determined. If there is no evidence of immunity, females who are not pregnant should be vaccinated. If there is no evidence of immunity, females who are pregnant should delay  immunization until after pregnancy.  Pneumococcal 13-valent conjugate (PCV13) vaccine.** / Consult your caregiver.  Pneumococcal polysaccharide (PPSV23) vaccine.** / 1 to 2 doses if you smoke cigarettes or if you have certain conditions.  Meningococcal vaccine.** / Consult your caregiver.  Hepatitis A vaccine.** / Consult your caregiver.  Hepatitis B vaccine.** / Consult your caregiver.  Haemophilus influenzae type b (Hib) vaccine.** / Consult your caregiver. Ages 65 and over  Blood pressure check.** / Every 1 to 2 years.  Lipid and cholesterol check.** / Every 5 years beginning at age 20.  Lung cancer screening. / Every year if you are aged 55 80 years and have a 30-pack-year history of smoking and currently smoke or have quit within the past 15 years. Yearly screening is stopped once you have quit smoking for at least 15 years or develop a health problem that would prevent you from having lung cancer treatment.  Clinical breast exam.** / Every year after age 40.  BRCA-related cancer risk assessment.** / For women who have family members with a BRCA-related cancer (breast, ovarian, tubal, or peritoneal cancers).  Mammogram.** / Every year beginning at age 40 and continuing for as long as you are in good health. Consult with your caregiver.  Pap test.** / Every 3 years starting at age   30 through age 65 or 70 with a 3 consecutive normal Pap tests. Testing can be stopped between 65 and 70 with 3 consecutive normal Pap tests and no abnormal Pap or HPV tests in the past 10 years.  HPV screening.** / Every 3 years from ages 30 through ages 65 or 70 with a history of 3 consecutive normal Pap tests. Testing can be stopped between 65 and 70 with 3 consecutive normal Pap tests and no abnormal Pap or HPV tests in the past 10 years.  Fecal occult blood test (FOBT) of stool. / Every year beginning at age 50 and continuing until age 75. You may not need to do this test if you get a colonoscopy  every 10 years.  Flexible sigmoidoscopy or colonoscopy.** / Every 5 years for a flexible sigmoidoscopy or every 10 years for a colonoscopy beginning at age 50 and continuing until age 75.  Hepatitis C blood test.** / For all people born from 1945 through 1965 and any individual with known risks for hepatitis C.  Osteoporosis screening.** / A one-time screening for women ages 65 and over and women at risk for fractures or osteoporosis.  Skin self-exam. / Monthly.  Influenza vaccine. / Every year.  Tetanus, diphtheria, and acellular pertussis (Tdap/Td) vaccine.** / 1 dose of Td every 10 years.  Varicella vaccine.** / Consult your caregiver.  Zoster vaccine.** / 1 dose for adults aged 60 years or older.  Pneumococcal 13-valent conjugate (PCV13) vaccine.** / Consult your caregiver.  Pneumococcal polysaccharide (PPSV23) vaccine.** / 1 dose for all adults aged 65 years and older.  Meningococcal vaccine.** / Consult your caregiver.  Hepatitis A vaccine.** / Consult your caregiver.  Hepatitis B vaccine.** / Consult your caregiver.  Haemophilus influenzae type b (Hib) vaccine.** / Consult your caregiver. ** Family history and personal history of risk and conditions may change your caregiver's recommendations. Document Released: 09/24/2001 Document Revised: 11/23/2012 Document Reviewed: 12/24/2010 [employer] Patient Information 2014 [employer].  

## 2013-07-16 NOTE — Assessment & Plan Note (Signed)
Normal GYN exam.  -will need Pap Smear in 2016 -Has upcoming repeat breast mammogram due to mass found in June 2014

## 2013-07-16 NOTE — Assessment & Plan Note (Signed)
Discussed having regular physical activity.

## 2013-07-19 ENCOUNTER — Encounter: Payer: Self-pay | Admitting: Family Medicine

## 2013-09-27 ENCOUNTER — Other Ambulatory Visit: Payer: Self-pay | Admitting: Family Medicine

## 2013-09-27 NOTE — Telephone Encounter (Signed)
Pt called and needs a refill on her BP medication. jw

## 2013-10-04 ENCOUNTER — Ambulatory Visit (INDEPENDENT_AMBULATORY_CARE_PROVIDER_SITE_OTHER): Payer: Managed Care, Other (non HMO) | Admitting: Family Medicine

## 2013-10-04 ENCOUNTER — Encounter: Payer: Self-pay | Admitting: Family Medicine

## 2013-10-04 VITALS — BP 105/71 | HR 70 | Temp 98.7°F | Ht 62.0 in | Wt 193.0 lb

## 2013-10-04 DIAGNOSIS — J069 Acute upper respiratory infection, unspecified: Secondary | ICD-10-CM

## 2013-10-04 NOTE — Progress Notes (Signed)
    Subjective:    Patient ID: Adrienne Collins is a 43 y.o. female presenting with flu like symptoms  on 10/04/2013  HPI: Feeling poorly x 3 days.  Had low grade temp over the last few days.  Taking Tylenol and cough syrup Tussin CF.  No headache or fever today.  Mostly congestion in her head.  Feels some better. Had flu shot.  Review of Systems  Constitutional: Negative for fever and chills.  HENT: Positive for congestion.   Respiratory: Positive for cough. Negative for shortness of breath and wheezing.   Cardiovascular: Negative for chest pain and leg swelling.  Gastrointestinal: Negative for abdominal pain.  Skin: Positive for rash.      Objective:    BP 105/71  Pulse 70  Temp(Src) 98.7 F (37.1 C) (Oral)  Ht 5\' 2"  (1.575 m)  Wt 193 lb (87.544 kg)  BMI 35.29 kg/m2 Physical Exam  Constitutional: She is oriented to person, place, and time. She appears well-developed and well-nourished.  HENT:  Head: Normocephalic and atraumatic.  Neck: Neck supple.  Cardiovascular: Normal rate and regular rhythm.   No murmur heard. Pulmonary/Chest: Effort normal. She has no wheezes.  Neurological: She is alert and oriented to person, place, and time.  Skin: Skin is warm.        Assessment & Plan:  Acute upper respiratory infections of unspecified site Symptomatic treatment as being done.  May add Mucinex or anti-histamine prn.    Return if symptoms worsen or fail to improve.

## 2013-10-04 NOTE — Patient Instructions (Signed)

## 2013-12-07 ENCOUNTER — Telehealth: Payer: Self-pay | Admitting: Family Medicine

## 2013-12-07 NOTE — Telephone Encounter (Signed)
Needs refill on metoprolol.  [facility] says we havent responded to their requests for refill. No requests seen [facility]

## 2013-12-07 NOTE — Telephone Encounter (Signed)
Will fwd request to MD.  Lazaro Arms, CMA

## 2013-12-08 ENCOUNTER — Other Ambulatory Visit: Payer: Self-pay | Admitting: *Deleted

## 2013-12-08 MED ORDER — METOPROLOL SUCCINATE ER 50 MG PO TB24: ORAL_TABLET | ORAL | Status: DC

## 2013-12-08 NOTE — Telephone Encounter (Signed)
Left voicemail that prescription sent to [facility]

## 2014-02-01 ENCOUNTER — Other Ambulatory Visit: Payer: Self-pay | Admitting: Family Medicine

## 2014-02-01 DIAGNOSIS — R928 Other abnormal and inconclusive findings on diagnostic imaging of breast: Secondary | ICD-10-CM

## 2014-04-03 ENCOUNTER — Other Ambulatory Visit: Payer: Self-pay | Admitting: Family Medicine

## 2014-06-20 ENCOUNTER — Other Ambulatory Visit: Payer: Self-pay | Admitting: *Deleted

## 2014-06-20 MED ORDER — METOPROLOL SUCCINATE ER 50 MG PO TB24: ORAL_TABLET | ORAL | Status: DC

## 2014-06-22 ENCOUNTER — Ambulatory Visit (INDEPENDENT_AMBULATORY_CARE_PROVIDER_SITE_OTHER): Payer: Managed Care, Other (non HMO) | Admitting: Family Medicine

## 2014-06-22 ENCOUNTER — Encounter: Payer: Self-pay | Admitting: Family Medicine

## 2014-06-22 VITALS — BP 82/64 | HR 64 | Temp 98.2°F | Wt 203.0 lb

## 2014-06-22 DIAGNOSIS — R109 Unspecified abdominal pain: Secondary | ICD-10-CM

## 2014-06-22 NOTE — Progress Notes (Signed)
   Subjective:    Patient ID: Adrienne Collins, female    DOB: 1970-09-16, 43 y.o.   MRN: [id number]  HPI: Pt presents to [facility] clinic for left low-side pain. She woke up yesterday with dull, throbbing pain in her left side, low on her side, that did come and go through the day. The pain subsided by yesterday evening. This morning, she has no pain. She had a hard BM yesterday and thinks it may have been constipation; the pain had mostly resolved before she had a BM so she's not sure if this helped her pain or not. She has no urinary symptoms and has no blood in her urine, but is near the end of her menses. The pain above did not feel like her normal menstrual pain. She takes ibuprofen during her menses but took no medication yesterday.  She was concerned about a urinary tract infection but doesn't have any urinary complaints, and has no history of kidney stones.  Review of Systems: As above. She denies fever, nausea, vomiting, change in bowel habits.     Objective:   Physical Exam BP 82/64 mmHg  Pulse 64  Temp(Src) 98.2 F (36.8 C) (Oral)  Wt 203 lb (92.08 kg)  LMP 06/17/2014 (Approximate) Gen: well-appearing adult female in NAD HEENT: [place]/AT, EOMI, PERRLA, MMM Cardio: RRR, no murmur appreciated Pulm: CTAB, no wheezes Abd: soft, nontender, BS+, no masses appreciated  No CVA or suprapubic tenderness  No side / flank tenderness Ext: warm, well-perfused MSK: no midline spine tenderness, no paraspinal muscle tenderness     Assessment & Plan:  43yo female with reported flank pain - DDx includes but not limited to constipation, UTI, kidney stone, menstrual pain - doubt UTI given no urinary symptoms, and kidney stone possible but constipation seems most likely - doubt utility of blood or urine testing at this point; pt declines further work-up at this time - advised good hydration, increased fiber in diet, and watchful waiting to see if symptoms recur or if new symptoms develop - suggested  MiraLAX +/- Colace to help soften stools to see if this helps, as well - red flags that would prompt return to care discussed, including fever, N/V, worse pain, blood in urine or stool, etc - F/U with PCP Dr. Ree Kida as needed  Note FYI to Dr. Dartha Lodge, MD PGY-3, [facility] 06/22/2014, 10:25 AM

## 2014-06-22 NOTE — Patient Instructions (Signed)
Thank you for coming in, today!  I think you may have been constipated or you may have had a kidney stone. I think constipation is more likely. Make sure you're drinking extra water for several days. Increase the fiber in your diet. If you want, you can take MiraLAX or Colace over the counter. Neither of these make bowel movements happen, but they make them easier to pass.  Come back as you need, or give [place] a call. Symptoms to look out for: - fever, nausea / vomiting, blood in stool or urine - worse pain that Tylenol or ibuprofen don't help  Please feel free to call with any questions or concerns at any time, at [phone]. --Dr. Venetia Maxon

## 2014-06-27 ENCOUNTER — Encounter: Payer: Self-pay | Admitting: Family Medicine

## 2014-06-27 ENCOUNTER — Ambulatory Visit (INDEPENDENT_AMBULATORY_CARE_PROVIDER_SITE_OTHER): Payer: Managed Care, Other (non HMO) | Admitting: Family Medicine

## 2014-06-27 VITALS — BP 146/85 | HR 105 | Temp 98.2°F | Ht 62.0 in | Wt 203.1 lb

## 2014-06-27 DIAGNOSIS — R109 Unspecified abdominal pain: Secondary | ICD-10-CM

## 2014-06-27 DIAGNOSIS — R1010 Upper abdominal pain, unspecified: Secondary | ICD-10-CM

## 2014-06-27 HISTORY — DX: Unspecified abdominal pain: R10.9

## 2014-06-27 LAB — POCT URINALYSIS DIPSTICK
BILIRUBIN UA: NEGATIVE
Glucose, UA: NEGATIVE
KETONES UA: NEGATIVE
Nitrite, UA: NEGATIVE
PH UA: 7
Protein, UA: NEGATIVE
RBC UA: NEGATIVE
Spec Grav, UA: 1.015
Urobilinogen, UA: 2

## 2014-06-27 LAB — POCT UA - MICROSCOPIC ONLY

## 2014-06-27 NOTE — Assessment & Plan Note (Signed)
Functional abdominal/flank pain Urine today and basically rules out renal stone, also unlikely to be UTI Urine culture Most likely constipation, recommended daily Metamucil/Benefiber titrated to one easy stool a day, also heavy yogurt for bowel flora health Plain film today to check for acute processes like obstruction, also will help with stool burden Follow-up if not improved

## 2014-06-27 NOTE — Progress Notes (Signed)
Patient ID: Adrienne Collins, female   DOB: [birth year], 43 y.o.   MRN: [id number]   HPI  Patient presents today for abdominal/flank pain  Patient states that her pain started about 5 days ago before she was seen for same-day appointment. At that time it as left-sided sharp flank pain that resolved before her appointment. They felt it was due to constipation. Since that time she has not started taking any additional laxatives, she is stooling once a day but sometimes has straining at stool.  She states that for the last couple of days the pain has shifted sides is now a right sided annoying/dull achy pain. She states that there are no specific aggravating or alleviating factors. It does not worsen with food, position. She denies emesis, she is tolerating by mouth's normally, she doesn't really have symptoms of a UTI.  Smoking status noted ROS: Per HPI  Objective: BP 146/85 mmHg  Pulse 105  Temp(Src) 98.2 F (36.8 C) (Oral)  Ht 5\' 2"  (1.575 m)  Wt 203 lb 1.6 oz (92.126 kg)  BMI 37.14 kg/m2  LMP 06/17/2014 (Approximate) Gen: NAD, alert, cooperative with exam HEENT: NCAT CV: RRR, good S1/S2, no murmur Resp: CTABL, no wheezes, non-labored Abd: SNTND, BS present, no guarding or organomegaly Neuro: Alert and oriented, No gross deficits  Assessment and plan:  Flank pain Functional abdominal/flank pain Urine today and basically rules out renal stone, also unlikely to be UTI Urine culture Most likely constipation, recommended daily Metamucil/Benefiber titrated to one easy stool a day, also heavy yogurt for bowel flora health Plain film today to check for acute processes like obstruction, also will help with stool burden Follow-up if not improved   Orders Placed This Encounter  Procedures  . Urine culture  . DG Abd 2 Views    Standing Status: Future     Number of Occurrences:      Standing Expiration Date: 08/28/2015    Order Specific Question:  Reason for Exam (SYMPTOM  OR DIAGNOSIS  REQUIRED)    Answer:  abd/flank pain, consider stool burdern    Order Specific Question:  Is the patient pregnant?    Answer:  No    Order Specific Question:  Preferred imaging location?    Answer:  [id number]  . Urinalysis Dipstick  . POCT UA - Microscopic Only

## 2014-06-28 ENCOUNTER — Ambulatory Visit
Admission: RE | Admit: 2014-06-28 | Discharge: 2014-06-28 | Disposition: A | Discharge status: Home / Self Care | Payer: Managed Care, Other (non HMO) | Source: Ambulatory Visit | Attending: Family Medicine | Admitting: Family Medicine

## 2014-06-28 ENCOUNTER — Telehealth: Payer: Self-pay | Admitting: Family Medicine

## 2014-06-28 DIAGNOSIS — R109 Unspecified abdominal pain: Secondary | ICD-10-CM

## 2014-06-28 LAB — URINE CULTURE
Colony Count: NO GROWTH
ORGANISM ID, BACTERIA: NO GROWTH

## 2014-06-28 IMAGING — CR DG ABDOMEN 2V
3 series · 3 of 3 positions shown · non-contrast
Comparison: None.

CLINICAL DATA: Acute bilateral flank pain and upper abdominal pain.
Mild constipation.

EXAM:
ABDOMEN - 2 VIEW

[view not recorded (1 of 3)]
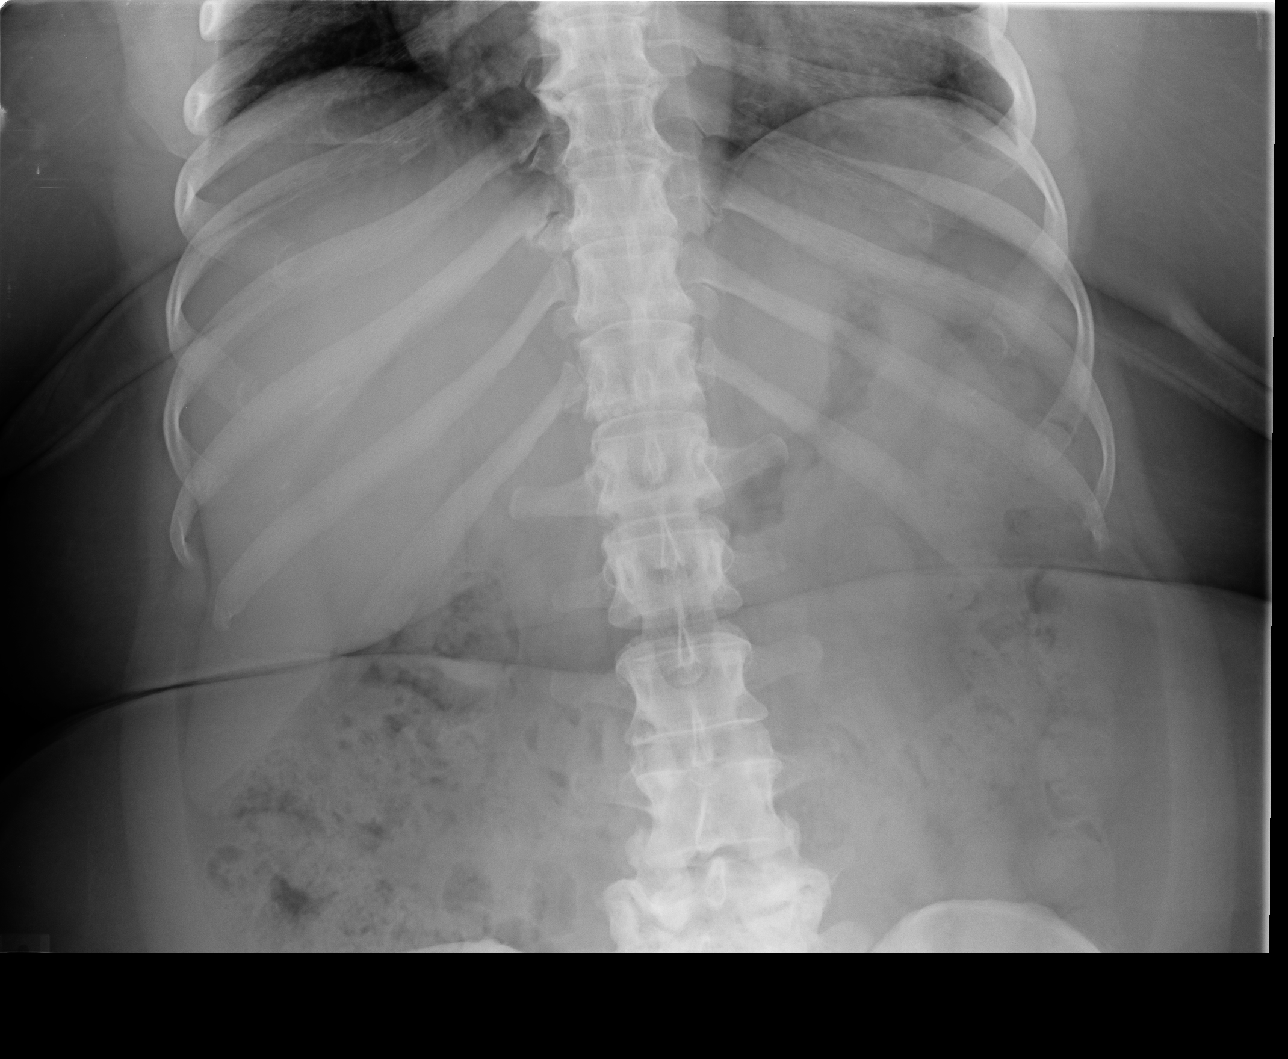

[view not recorded (2 of 3)]
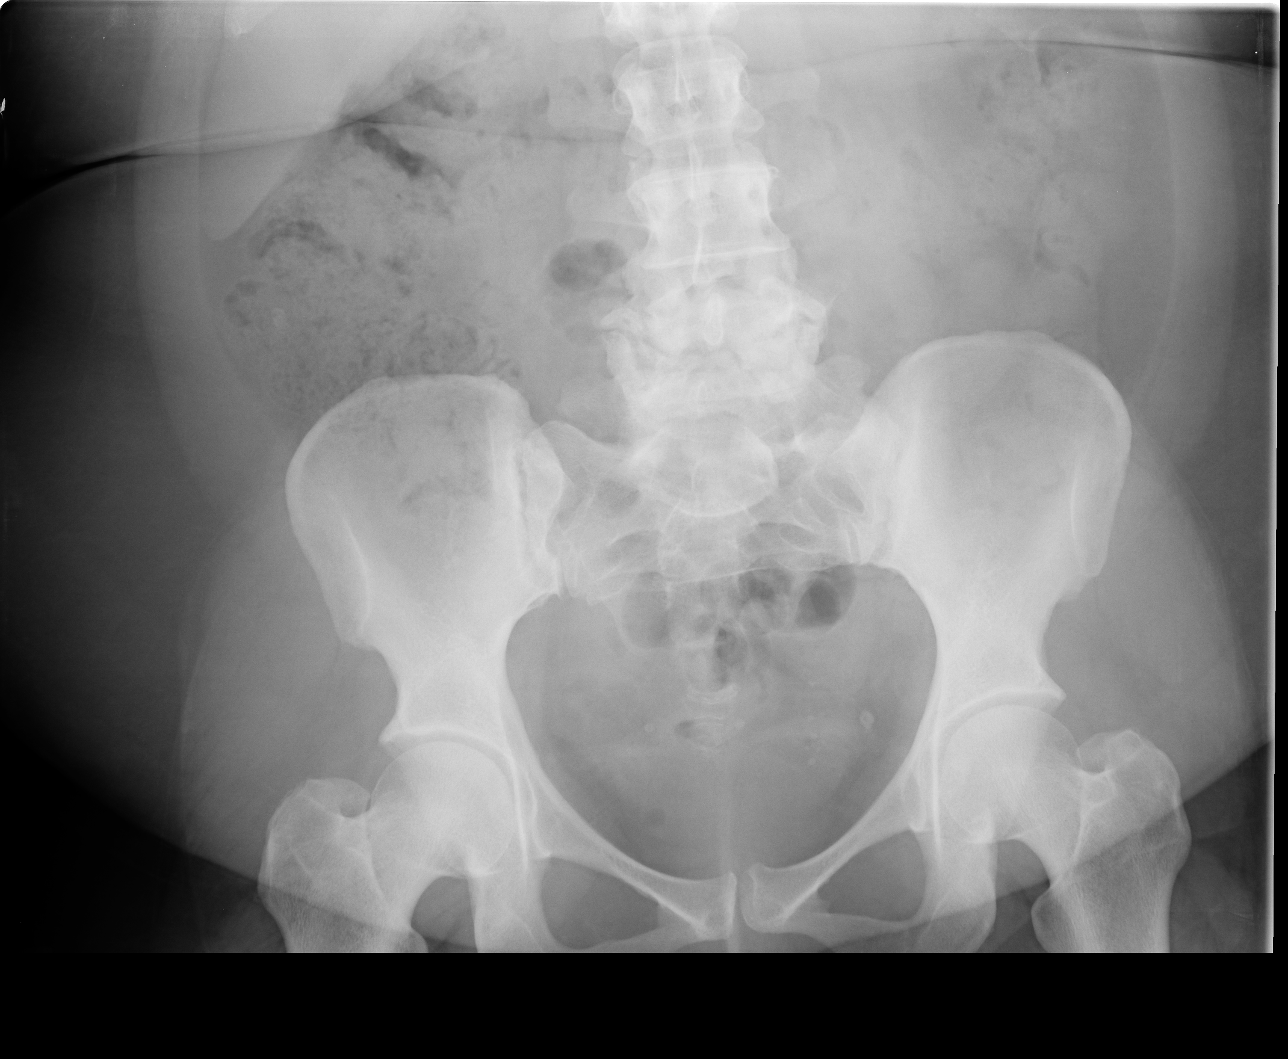

[view not recorded (3 of 3)]
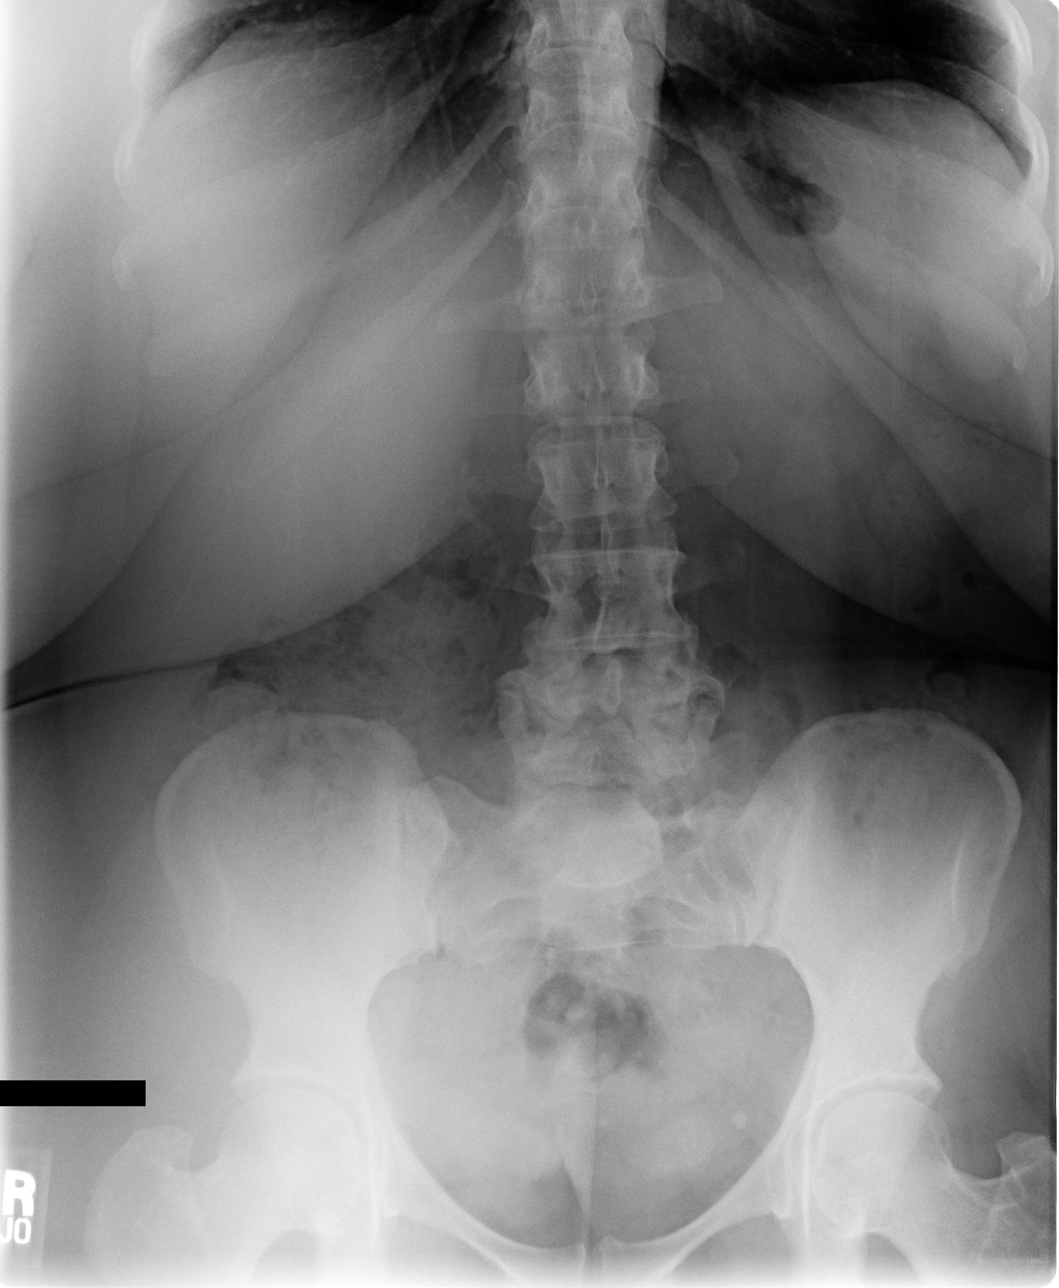

[3 of 3 positions shown; findings below may reference images not displayed]

FINDINGS: Bowel gas pattern unremarkable without evidence of obstruction or
significant ileus. No evidence of free air or significant air-fluid
levels on the erect image. Moderate stool burden in the colon.
Phleboliths in both sides of the pelvis. No opaque urinary tract
calculi. Severe degenerative changes involving the lower lumbar
spine.
IMPRESSION: No acute abdominal abnormality. No visible opaque urinary tract
calculi. Moderate stool burden.

## 2014-06-28 NOTE — Telephone Encounter (Signed)
Patient informed, expressed understanding. 

## 2014-06-28 NOTE — Telephone Encounter (Signed)
Abd film with moderate stool burden and no acute findings, will continue plan of working on constipation.   Laroy Apple, MD [place] Resident, PGY-3 06/28/2014, 4:43 PM

## 2014-06-30 ENCOUNTER — Telehealth: Payer: Self-pay | Admitting: Family Medicine

## 2014-06-30 NOTE — Telephone Encounter (Signed)
Pt came by with a request for a letter from her doctor. 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 Given to red team for any clinical completion.

## 2014-06-30 NOTE — Telephone Encounter (Signed)
Will forward to PCP 

## 2014-07-01 ENCOUNTER — Encounter: Payer: Self-pay | Admitting: Family Medicine

## 2014-07-01 NOTE — Telephone Encounter (Signed)
Spoke to patient, recently had DOT physical, needs letter stating that she is treated for palpitations with Metoprolol and is stable. Letter printed and left in front office for pickup.

## 2014-07-01 NOTE — Telephone Encounter (Signed)
Attempted to contact patient to clarify request. Reached voicemail, did not leave message. Will attempt to call again later today.

## 2014-07-18 ENCOUNTER — Other Ambulatory Visit: Payer: Self-pay | Admitting: Family Medicine

## 2014-07-20 ENCOUNTER — Ambulatory Visit (INDEPENDENT_AMBULATORY_CARE_PROVIDER_SITE_OTHER): Payer: Managed Care, Other (non HMO) | Admitting: Family Medicine

## 2014-07-20 ENCOUNTER — Encounter: Payer: Self-pay | Admitting: Family Medicine

## 2014-07-20 VITALS — BP 135/82 | HR 71 | Temp 98.1°F | Wt 198.8 lb

## 2014-07-20 DIAGNOSIS — G5600 Carpal tunnel syndrome, unspecified upper limb: Secondary | ICD-10-CM

## 2014-07-20 DIAGNOSIS — G5602 Carpal tunnel syndrome, left upper limb: Secondary | ICD-10-CM

## 2014-07-20 HISTORY — DX: Carpal tunnel syndrome, unspecified upper limb: G56.00

## 2014-07-20 MED ORDER — WRIST SPLINT/COCK-UP/LEFT L MISC: Status: DC

## 2014-07-20 NOTE — Progress Notes (Addendum)
   Subjective:    Patient ID: Adrienne Collins, female    DOB: [birth year], 42 y.o.   MRN: [id number]  HPI  Left wrist pain: Patient presents to family medicine clinic with complaints of increasing pain of her left wrist that started to become worse approximately 10 days ago. She states she's had some mild discomfort in her wrist and left hand for a couple months, but noticed it becoming worse recently. She does endorse some tingling in her fingers (first, second and third digits) as well. She has tried Tylenol, Advil, and naproxen without good results. 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 She denies any trauma to her neck, back, shoulder, arm or wrist. She denies any erythema of the joints or swelling. She has been sleeping on a couch for the past 2 weeks at her friend's house. She has not tried any heat/ice therapy. 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 She is right handed.  Former smoker  Past Medical History  Diagnosis Date  . History of ETT 08/2006    no sustained tachycardia  . S/P colonoscopy 10/27/2008    [facility]  . Hypertension   . Prediabetes   . Arthritis    No Known Allergies    Review of Systems Per history of present illness    Objective:   Physical Exam BP 135/82 mmHg  Pulse 71  Temp(Src) 98.1 F (36.7 C) (Oral)  Wt 198 lb 12.8 oz (90.175 kg)  LMP 07/12/2014 Gen: Very pleasant, African-American female, no acute distress, nontoxic appearance, well-developed, well-nourished, obese HEENT: AT. [place].  Bilateral eyes without injections or icterus. MMM.  MSK: No erythema, edema or soft tissue swelling of neck, back, shoulder, elbow, wrist or hand. No bony tenderness of cervical or thoracic spine. No tenderness in her left wrist. Full range of motion of neck, left arm and wrist. Positive Tinel's left wrist, positive Phalen's left wrist     Assessment & Plan:

## 2014-07-20 NOTE — Assessment & Plan Note (Signed)
Patient with signs and symptoms of left carpal tunnel today on exam. Educated patient on carpal tunnel syndrome, and therapeutic recommendations to help improve her pain. Prescribed mobic for use to help with her carpal tunnel pain, and her arthritic back. Advised patient to not to use other NSAIDs while using mobic. Prescribed left cockup wrist splint for nighttime use for carpal tunnel. Patient follow-up with PCP in 4 weeks, if no improvement did briefly discuss possible other medication treatment modalities for nerve pain, patient is not wanting to try many different medications at this time.

## 2014-07-20 NOTE — Patient Instructions (Signed)
Carpal Tunnel Release Carpal tunnel release is done to relieve the pressure on the nerves and tendons on the bottom side of your wrist.  LET YOUR CAREGIVER KNOW ABOUT:   Allergies to food or medicine.  Medicines taken, including vitamins, herbs, eyedrops, over-the-counter medicines, and creams.  Use of steroids (by mouth or creams).  Previous problems with anesthetics or numbing medicines.  History of bleeding problems or blood clots.  Previous surgery.  Other health problems, including diabetes and kidney problems.  Possibility of pregnancy, if this applies. RISKS AND COMPLICATIONS  Some problems that may happen after this procedure include:  Infection.  Damage to the nerves, arteries or tendons could occur. This would be very uncommon.  Bleeding. BEFORE THE PROCEDURE   This surgery may be done while you are asleep (general anesthetic) or may be done under a block where only your forearm and the surgical area is numb.  If the surgery is done under a block, the numbness will gradually wear off within several hours after surgery. HOME CARE INSTRUCTIONS   Have a responsible person with you for 24 hours.  Do not drive a car or use public transportation for 24 hours.  Only take over-the-counter or prescription medicines for pain, discomfort, or fever as directed by your caregiver. Take them as directed.  You may put ice on the palm side of the affected wrist.  Put ice in a plastic bag.  Place a towel between your skin and the bag.  Leave the ice on for 20 to 30 minutes, 4 times per day.  If you were given a splint to keep your wrist from bending, use it as directed. It is important to wear the splint at night or as directed. Use the splint for as long as you have pain or numbness in your hand, arm, or wrist. This may take 1 to 2 months.  Keep your hand raised (elevated) above the level of your heart as much as possible. This keeps swelling down and helps with  discomfort.  Change bandages (dressings) as directed.  Keep the wound clean and dry. SEEK MEDICAL CARE IF:   You develop pain not relieved with medications.  You develop numbness of your hand.  You develop bleeding from your surgical site.  You have an oral temperature above 102 F (38.9 C).  You develop redness or swelling of the surgical site.  You develop new, unexplained problems. SEEK IMMEDIATE MEDICAL CARE IF:   You develop a rash.  You have difficulty breathing.  You develop any reaction or side effects to medications given. Document Released: 10/19/2003 Document Revised: 10/21/2011 Document Reviewed: 06/04/2007 [facility] Patient Information 2015 [place], [place]. This information is not intended to replace advice given to you by your health care provider. Make sure you discuss any questions you have with your health care provider.  Follow up with PCP in 4 weeks

## 2014-07-24 ENCOUNTER — Emergency Department (HOSPITAL_COMMUNITY)
Admission: EM | Admit: 2014-07-24 | Discharge: 2014-07-24 | Disposition: A | Discharge status: Home / Self Care | Payer: Managed Care, Other (non HMO) | Attending: Emergency Medicine | Admitting: Emergency Medicine

## 2014-07-24 ENCOUNTER — Encounter (HOSPITAL_COMMUNITY): Payer: Self-pay | Admitting: *Deleted

## 2014-07-24 ENCOUNTER — Emergency Department (HOSPITAL_COMMUNITY): Payer: Managed Care, Other (non HMO)

## 2014-07-24 DIAGNOSIS — D729 Disorder of white blood cells, unspecified: Secondary | ICD-10-CM | POA: Diagnosis not present

## 2014-07-24 DIAGNOSIS — Z79899 Other long term (current) drug therapy: Secondary | ICD-10-CM | POA: Insufficient documentation

## 2014-07-24 DIAGNOSIS — R079 Chest pain, unspecified: Secondary | ICD-10-CM

## 2014-07-24 DIAGNOSIS — I10 Essential (primary) hypertension: Secondary | ICD-10-CM | POA: Insufficient documentation

## 2014-07-24 DIAGNOSIS — M1990 Unspecified osteoarthritis, unspecified site: Secondary | ICD-10-CM | POA: Insufficient documentation

## 2014-07-24 DIAGNOSIS — R0789 Other chest pain: Secondary | ICD-10-CM | POA: Diagnosis not present

## 2014-07-24 DIAGNOSIS — Z87891 Personal history of nicotine dependence: Secondary | ICD-10-CM | POA: Insufficient documentation

## 2014-07-24 DIAGNOSIS — R7989 Other specified abnormal findings of blood chemistry: Secondary | ICD-10-CM

## 2014-07-24 LAB — BASIC METABOLIC PANEL
ANION GAP: 17 — AB (ref 5–15)
BUN: 12 mg/dL (ref 6–23)
CHLORIDE: 94 meq/L — AB (ref 96–112)
CO2: 22 mEq/L (ref 19–32)
Calcium: 10.1 mg/dL (ref 8.4–10.5)
Creatinine, Ser: 0.64 mg/dL (ref 0.50–1.10)
GFR calc Af Amer: >90 mL/min (ref >90)
Glucose, Bld: 92 mg/dL (ref 70–99)
Potassium: 3.4 mEq/L — ABNORMAL LOW (ref 3.7–5.3)
SODIUM: 133 meq/L — AB (ref 137–147)

## 2014-07-24 LAB — CBC
HEMATOCRIT: 36.9 % (ref 36.0–46.0)
Hemoglobin: 11.2 g/dL — ABNORMAL LOW (ref 12.0–15.0)
MCH: 23.7 pg — ABNORMAL LOW (ref 26.0–34.0)
MCHC: 30.4 g/dL (ref 30.0–36.0)
MCV: 78.2 fL (ref 78.0–100.0)
PLATELETS: 338 10*3/uL (ref 150–400)
RBC: 4.72 MIL/uL (ref 3.87–5.11)
RDW: 16.1 % — AB (ref 11.5–15.5)
WBC: 5.2 10*3/uL (ref 4.0–10.5)

## 2014-07-24 LAB — D-DIMER, QUANTITATIVE: D-Dimer, Quant: 0.56 ug/mL-FEU — ABNORMAL HIGH (ref 0.00–0.48)

## 2014-07-24 LAB — I-STAT TROPONIN, ED
TROPONIN I, POC: 0 ng/mL (ref 0.00–0.08)
TROPONIN I, POC: 0 ng/mL (ref 0.00–0.08)

## 2014-07-24 LAB — POC URINE PREG, ED: Preg Test, Ur: NEGATIVE

## 2014-07-24 LAB — PRO B NATRIURETIC PEPTIDE: PRO B NATRI PEPTIDE: 21.2 pg/mL (ref 0–125)

## 2014-07-24 IMAGING — CT CT ANGIO CHEST
2 of 9 series · 18 of 46 positions shown · IV contrast (omnipaque)
Comparison: None.

CLINICAL DATA: Chest tightness and shortness of Breath today

EXAM:
CT ANGIOGRAPHY CHEST WITH CONTRAST
TECHNIQUE: Multidetector CT imaging of the chest was performed using the
standard protocol during bolus administration of intravenous
contrast. Multiplanar CT image reconstructions and MIPs were
obtained to evaluate the vascular anatomy.
CONTRAST:  100mL OMNIPAQUE IOHEXOL 300 MG/ML  SOLN

[Series 6: thins · axial · 0.57mm/px · z∈[-190,+6]mm · 15 of 216 slices shown]
[im 10/216  lung]
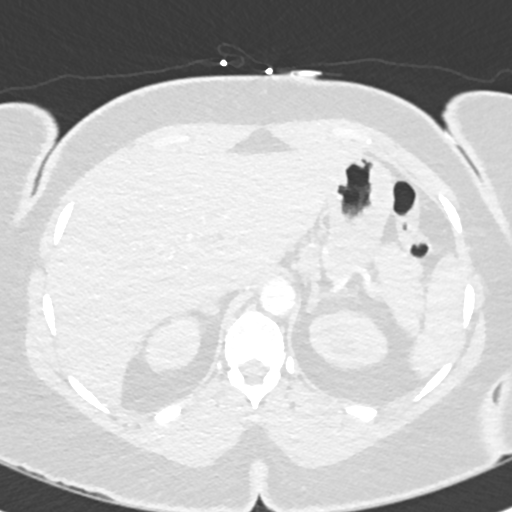
[im 30/216  soft-tissue]
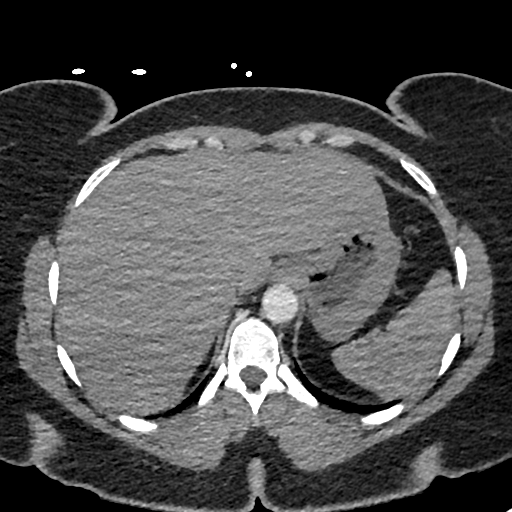
[im 40/216  lung]
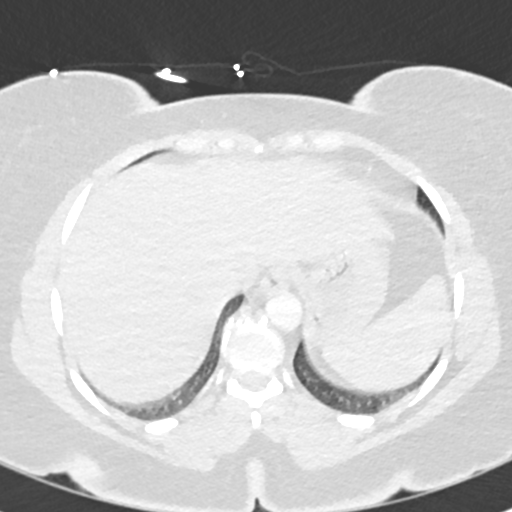
[im 49/216  soft-tissue]
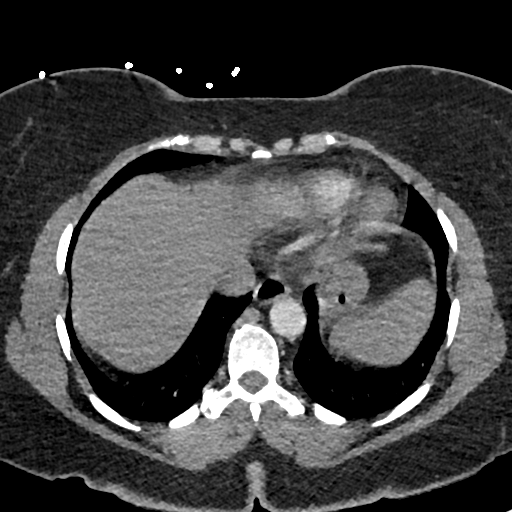
[im 69/216  lung]
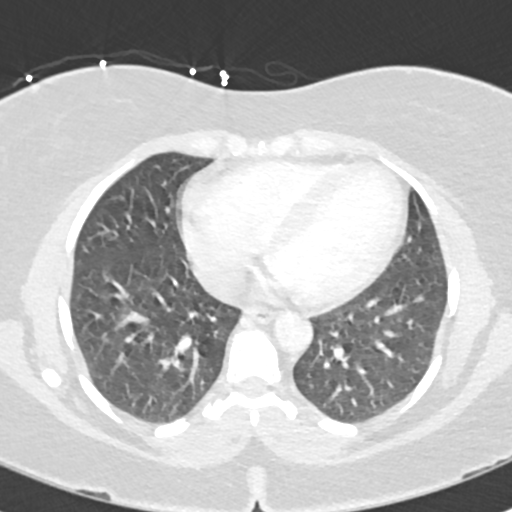
[im 79/216  soft-tissue]
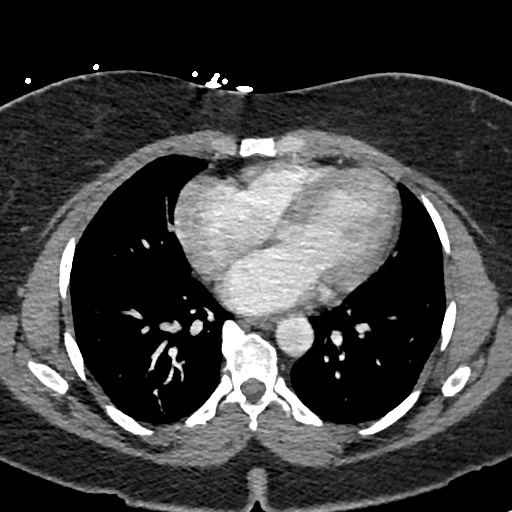
[im 98/216  lung]
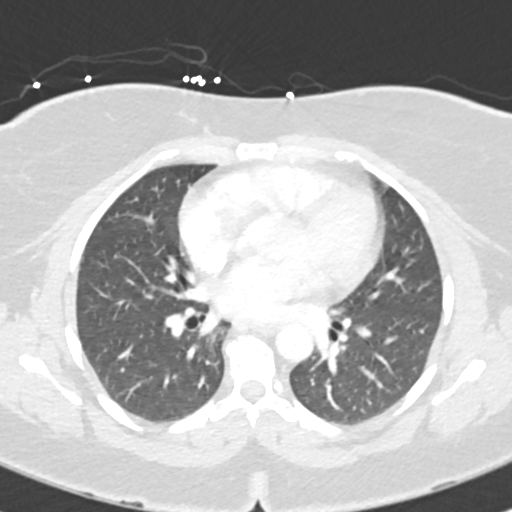
[im 108/216  soft-tissue]
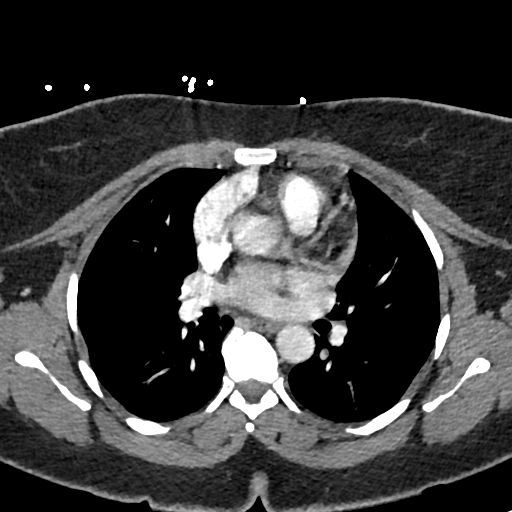
[im 118/216  lung]
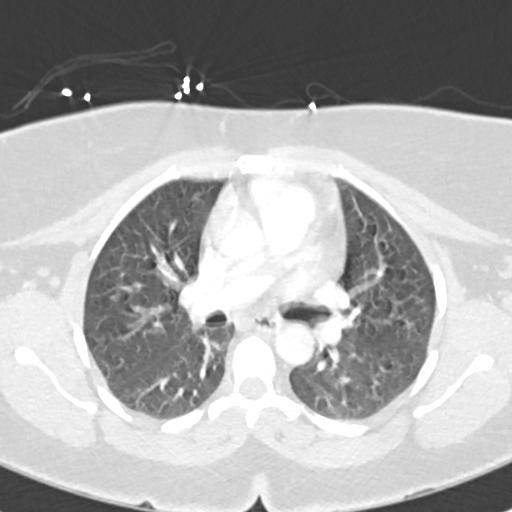
[im 137/216  soft-tissue]
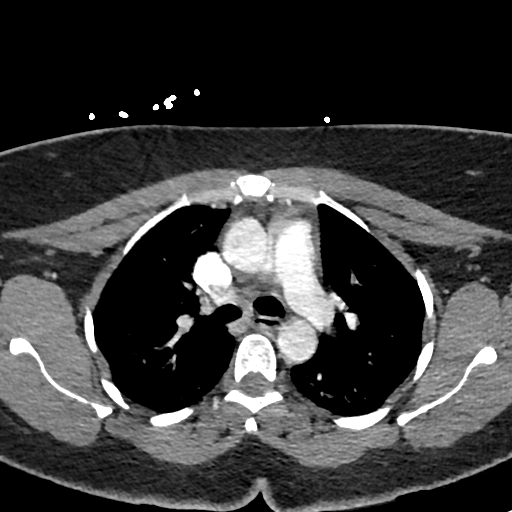
[im 147/216  lung]
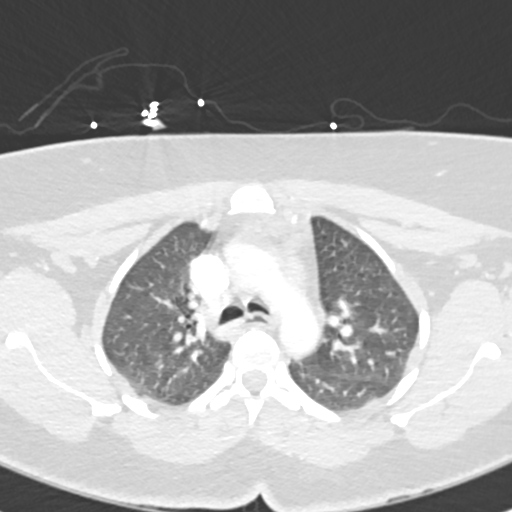
[im 167/216  soft-tissue]
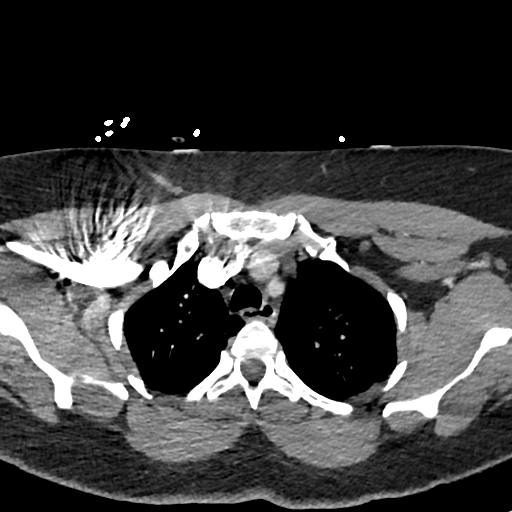
[im 176/216  lung]
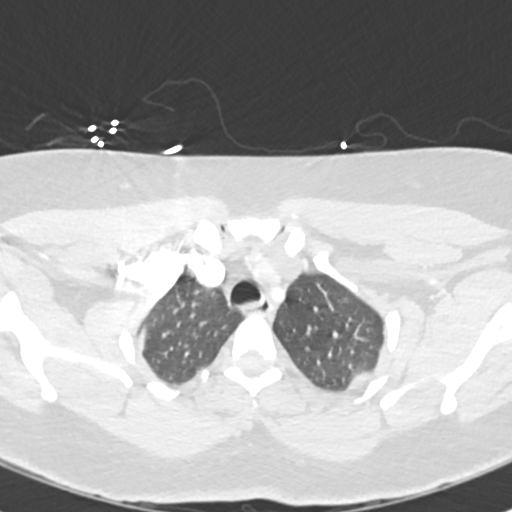
[im 186/216  soft-tissue]
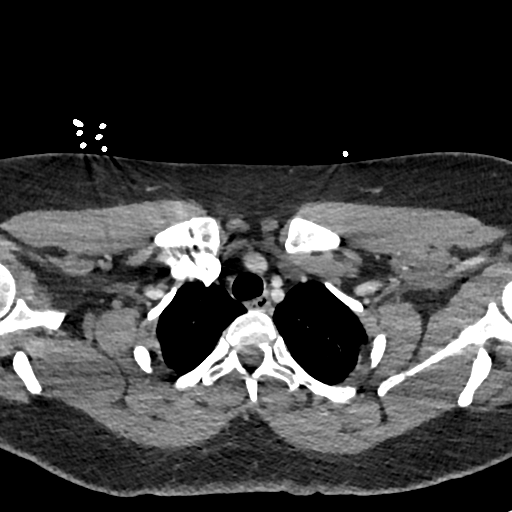
[im 206/216  lung]
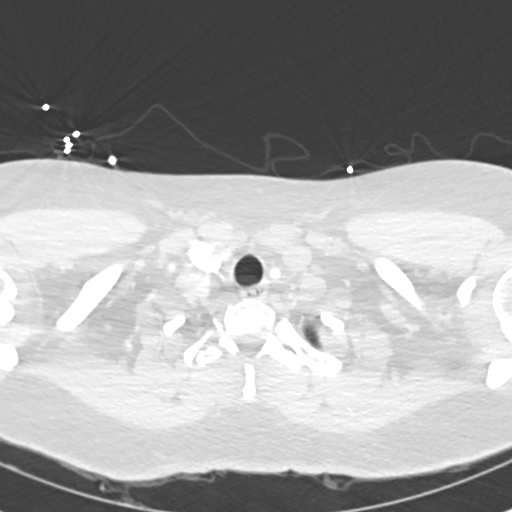

[Series 8: coronal mpr · coronal · 0.49mm/px · 3 of 111 slices shown]
[im 28/111  soft-tissue]
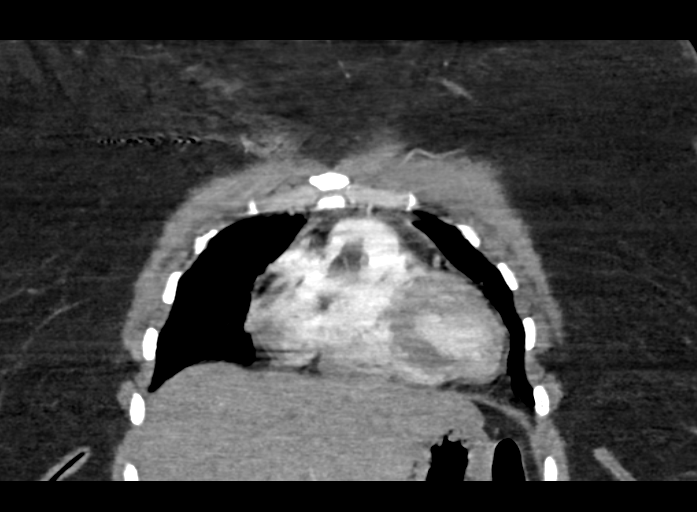
[im 56/111  soft-tissue]
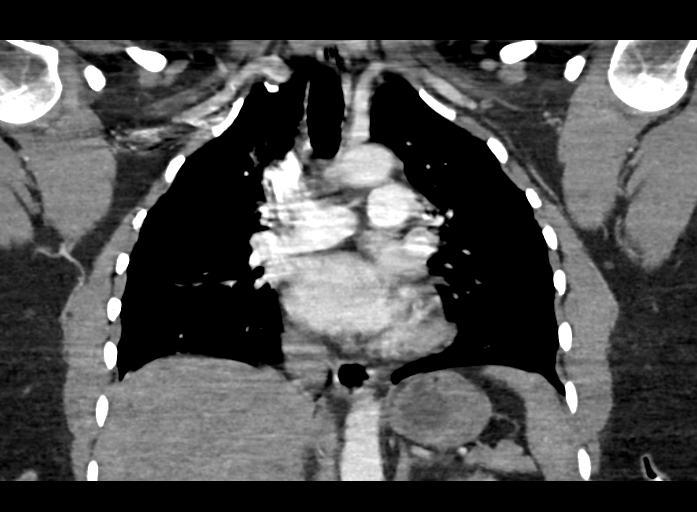
[im 83/111  soft-tissue]
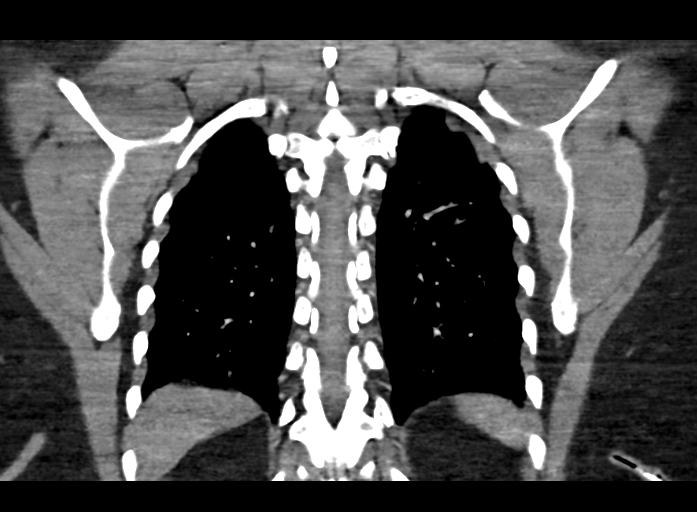

[18 of 46 positions shown; findings below may reference images not displayed]

FINDINGS: Lung bases are free of acute infiltrate or sizable effusion. The
thoracic inlet is within normal limits. The thoracic aorta and its
branches are unremarkable. The pulmonary artery is well visualized
and shows a normal branching pattern. No filling defects are
identified to suggest pulmonary embolism. No significant hilar or
mediastinal adenopathy is noted. Degenerative changes of the
thoracic spine are noted.

Review of the MIP images confirms the above findings.
IMPRESSION: No evidence of pulmonary emboli.  No acute abnormality is noted.

## 2014-07-24 IMAGING — CR DG CHEST 2V
2 series · 2 of 2 positions shown · non-contrast
Comparison: [DATE]

CLINICAL DATA: Chest tightness and SOB that started yesterday. H/o
controlled HTN.

EXAM:
CHEST  2 VIEW

[chest pa]
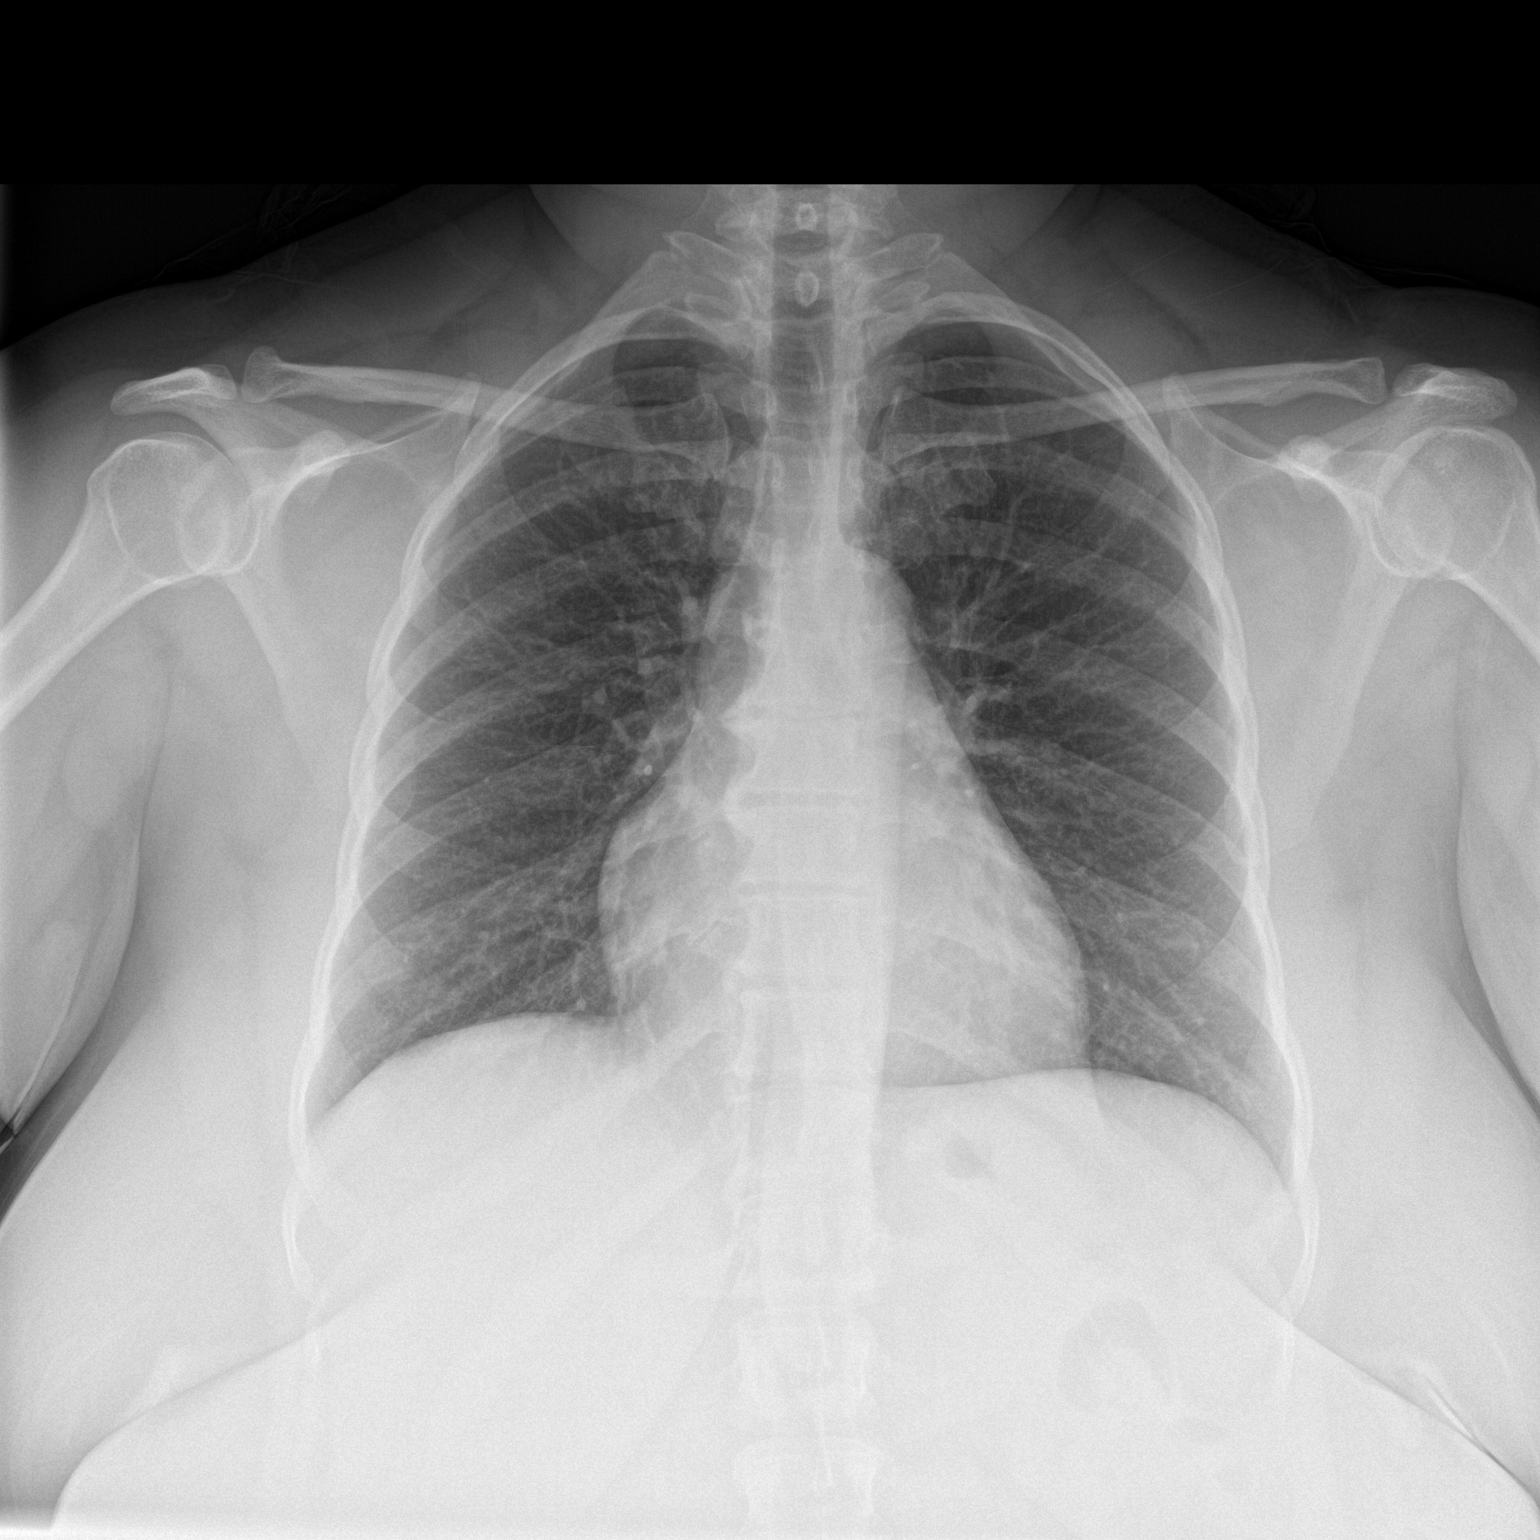

[chest lat]
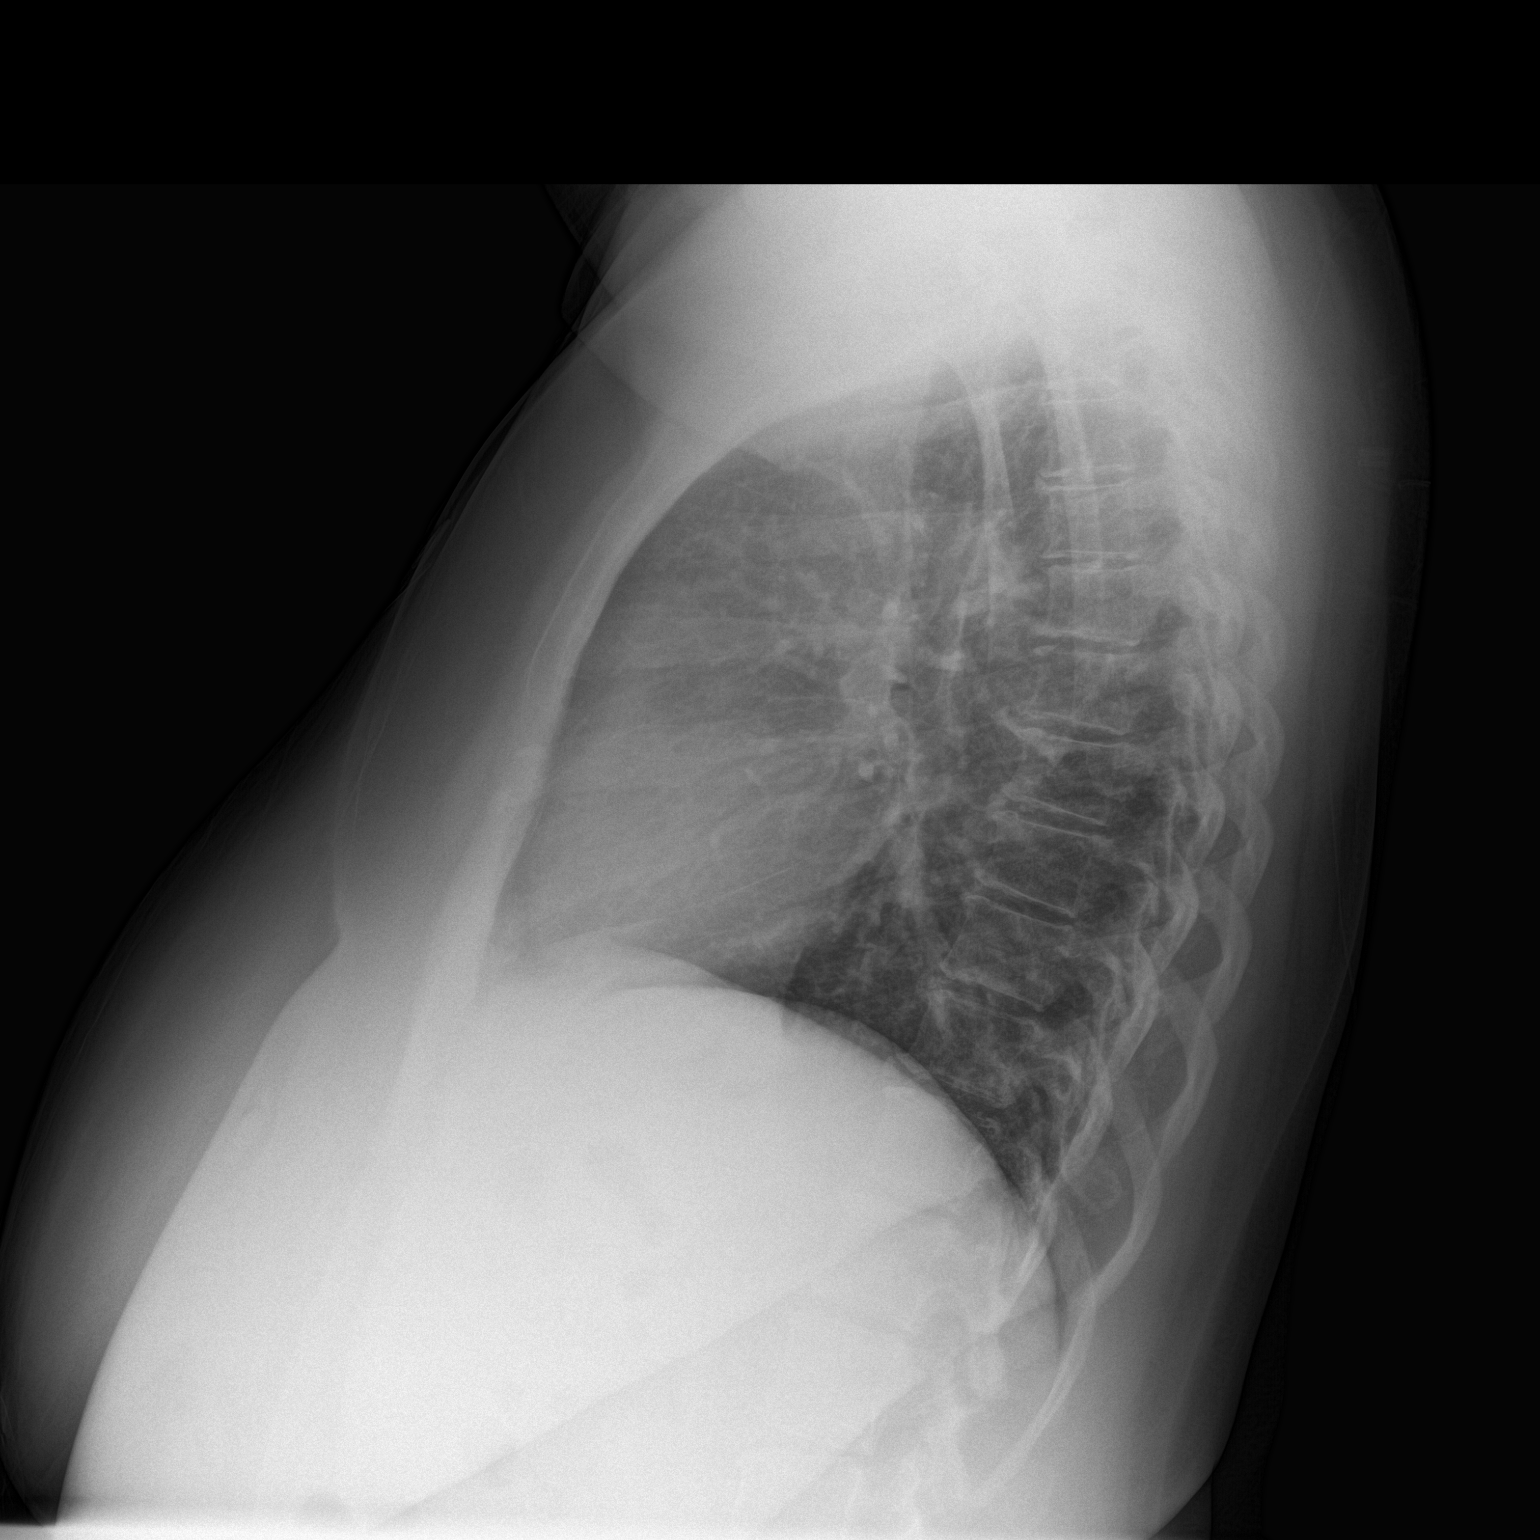

[2 of 2 positions shown; findings below may reference images not displayed]

FINDINGS: Heart, mediastinum and hila are unremarkable. Lungs are clear. No
pleural effusion or pneumothorax. Bony thorax is intact.
IMPRESSION: No active cardiopulmonary disease.

## 2014-07-24 MED ORDER — IOHEXOL 300 MG/ML  SOLN
100.0000 mL | Freq: Once | INTRAMUSCULAR | Status: AC | PRN
  Administered 2014-07-24: 100 mL via INTRAVENOUS

## 2014-07-24 MED ORDER — IPRATROPIUM-ALBUTEROL 0.5-2.5 (3) MG/3ML IN SOLN
3.0000 mL | RESPIRATORY_TRACT | Status: DC
  Administered 2014-07-24 (×2): 3 mL via RESPIRATORY_TRACT
  Filled 2014-07-24 (×2): qty 3

## 2014-07-24 NOTE — ED Provider Notes (Signed)
CSN: [id number]     Arrival date & time 07/24/14  1506 History   First MD Initiated Contact with Patient 07/24/14 1614     Chief Complaint  Patient presents with  . Chest Pain     (Consider location/radiation/quality/duration/timing/severity/associated sxs/prior Treatment) Patient is a 43 y.o. female presenting with chest pain. The history is provided by the patient.  Chest Pain Pain location:  Substernal area Pain quality: tightness   Pain radiates to:  Does not radiate Pain radiates to the back: no   Pain severity:  Mild Onset quality:  Sudden Duration: about 5 minutes at a time. Timing:  Intermittent (about 4-5 times per day) Progression:  Worsening Chronicity:  New Context: at rest   Context: no stress   Relieved by:  Nothing Worsened by:  Nothing tried Associated symptoms: no abdominal pain, no fever, no shortness of breath and not vomiting   Associated symptoms comment:  Sinus congestion one day   Past Medical History  Diagnosis Date  . History of ETT 08/2006    no sustained tachycardia  . S/P colonoscopy 10/27/2008    [place]  . Hypertension   . Prediabetes   . Arthritis    Past Surgical History  Procedure Laterality Date  . Cesarean section  1990  . Cesarean section  1991  . Salpingectomy      right   Family History  Problem Relation Age of Onset  . Diabetes Mother   . Hypertension Mother   . Diabetes Brother     diabetes, CVA  . Stroke Brother   . Stroke Sister     drug abuse   History  Substance Use Topics  . Smoking status: Former Smoker    Quit date: 01/30/1994  . Smokeless tobacco: Not on file  . Alcohol Use: No     Comment: quit 1995   OB History    No data available     Review of Systems  Constitutional: Negative for fever and chills.  Respiratory: Negative for shortness of breath.   Cardiovascular: Positive for chest pain.  Gastrointestinal: Negative for vomiting and abdominal pain.  All other systems reviewed and are  negative.     Allergies  Review of patient's allergies indicates no known allergies.  Home Medications   Prior to Admission medications   Medication Sig Start Date End Date Taking? Authorizing Provider  acetaminophen (TYLENOL) 650 MG CR tablet Take 2 tablets (1,300 mg total) by mouth every 8 (eight) hours as needed for pain. take 2 tabs every 6 hours as needed for pain 10/24/10  Yes Candelaria Celeste, MD  Elastic Bandages & Supports (WRIST SPLINT/COCK-UP/LEFT L) MISC Please fit pt. For size. Left wrist splint for carpal tunnel 07/20/14  Yes Renee A Kuneff, DO  Enalapril-Hydrochlorothiazide 5-12.5 MG per tablet TAKE ONE TABLET BY MOUTH ONCE DAILY 07/19/14  Yes Lupita Dawn, MD  ferrous sulfate 325 (65 FE) MG tablet Take 325 mg by mouth daily with breakfast.   Yes Historical Provider, MD  ibuprofen (ADVIL,MOTRIN) 200 MG tablet Take 800 mg by mouth every 6 (six) hours as needed for pain.   Yes Historical Provider, MD  metoprolol succinate (TOPROL-XL) 50 MG 24 hr tablet TAKE ONE TABLET BY MOUTH EVERY DAY 06/20/14  Yes Lupita Dawn, MD  traMADol (ULTRAM) 50 MG tablet Take 1 tablet (50 mg total) by mouth every 6 (six) hours as needed. 1-2 tabs for severe pain Patient not taking: Reported on 07/24/2014 10/24/10   Candelaria Celeste, MD  BP 113/87 mmHg  Pulse 80  Temp(Src) 98.1 F (36.7 C) (Oral)  Resp 19  Ht 5\' 2"  (1.575 m)  Wt 198 lb (89.812 kg)  BMI 36.21 kg/m2  SpO2 100%  LMP 07/15/2014 Physical Exam  Constitutional: She is oriented to person, place, and time. She appears well-developed and well-nourished. No distress.  HENT:  Head: Normocephalic and atraumatic.  Mouth/Throat: Oropharynx is clear and moist.  Eyes: EOM are normal. Pupils are equal, round, and reactive to light.  Neck: Normal range of motion. Neck supple.  Cardiovascular: Normal rate and regular rhythm.  Exam reveals no friction rub.   No murmur heard. Pulmonary/Chest: Effort normal and breath sounds normal. No respiratory distress.  She has no wheezes. She has no rales.  Abdominal: Soft. She exhibits no distension. There is no tenderness. There is no rebound.  Musculoskeletal: Normal range of motion. She exhibits no edema.  Neurological: She is alert and oriented to person, place, and time. No cranial nerve deficit. She exhibits normal muscle tone. Coordination normal.  Skin: No rash noted. She is not diaphoretic.  Nursing note and vitals reviewed.   ED Course  Procedures (including critical care time) Labs Review Labs Reviewed  CBC - Abnormal; Notable for the following:    Hemoglobin 11.2 (*)    MCH 23.7 (*)    RDW 16.1 (*)    All other components within normal limits  BASIC METABOLIC PANEL - Abnormal; Notable for the following:    Sodium 133 (*)    Potassium 3.4 (*)    Chloride 94 (*)    Anion gap 17 (*)    All other components within normal limits  PRO B NATRIURETIC PEPTIDE  I-STAT TROPOININ, ED    Imaging Review Dg Chest 2 View  07/24/2014   CLINICAL DATA:  Chest tightness and SOB that started yesterday. H/o controlled HTN.  EXAM: CHEST  2 VIEW  COMPARISON:  06/19/2006  FINDINGS: Heart, mediastinum and hila are unremarkable. Lungs are clear. No pleural effusion or pneumothorax. Bony thorax is intact.  IMPRESSION: No active cardiopulmonary disease.   Electronically Signed   By: Lajean Manes M.D.   On: 07/24/2014 16:39   Ct Angio Chest Pe W/cm &/or Wo Cm  07/24/2014   CLINICAL DATA:  Chest tightness and shortness of Breath today  EXAM: CT ANGIOGRAPHY CHEST WITH CONTRAST  TECHNIQUE: Multidetector CT imaging of the chest was performed using the standard protocol during bolus administration of intravenous contrast. Multiplanar CT image reconstructions and MIPs were obtained to evaluate the vascular anatomy.  CONTRAST:  157mL OMNIPAQUE IOHEXOL 300 MG/ML  SOLN  COMPARISON:  None.  FINDINGS: Lung bases are free of acute infiltrate or sizable effusion. The thoracic inlet is within normal limits. The thoracic aorta  and its branches are unremarkable. The pulmonary artery is well visualized and shows a normal branching pattern. No filling defects are identified to suggest pulmonary embolism. No significant hilar or mediastinal adenopathy is noted. Degenerative changes of the thoracic spine are noted.  Review of the MIP images confirms the above findings.  IMPRESSION: No evidence of pulmonary emboli.  No acute abnormality is noted.   Electronically Signed   By: Inez Catalina M.D.   On: 07/24/2014 20:59     EKG Interpretation   Date/Time:  Sunday July 24 2014 15:16:15 EST Ventricular Rate:  100 PR Interval:  132 QRS Duration: 72 QT Interval:  344 QTC Calculation: 443 R Axis:   -2 Text Interpretation:  Normal sinus rhythm Minimal  voltage criteria for  LVH, may be normal variant Nonspecific ST and T wave abnormality Abnormal  ECG Similar to prior with nonspecific changes Confirmed by Mingo Amber  MD,  Darionna Banke ([id number]) on 07/24/2014 4:17:00 PM      MDM   Final diagnoses:  Elevated d-dimer  Chest tightness    54F here with chest tightness. No prior episodes of this. Happens 4-5x per day, lasting about 5 minutes at a time. No instigating factors. No chest pressure, fever. Occasional cough.  AFVSS here. Unable to [facility] her because HR 100, will check d-dimer. Low suspicion for PE. Patient's exam benign. Initial troponin normal. EKG similar to prior. D-dimer elevated, CT PE scan negative. Serial troponin negative. Doing well, stable for discharge, instructed to f/u with PCP if pain persists.  Evelina Bucy, MD 07/25/14 [phone]

## 2014-07-24 NOTE — ED Notes (Signed)
Patient arrived to E39 from X-ray

## 2014-07-24 NOTE — Discharge Instructions (Signed)

## 2014-07-24 NOTE — ED Notes (Signed)
Pt placed in a gown. Continuous monitoring of BP, pulse ox and cardiac.

## 2014-07-24 NOTE — ED Notes (Signed)
Pt A&OX4, ambulatory at d/c with steady gait, NAD, pt thanking staff for her care.

## 2014-07-24 NOTE — ED Notes (Signed)
Pt reports mid chest tightness that started yesterday and mild sob, denies recent cough. No acute distress noted, ekg done at triage, airway intact.

## 2014-07-24 NOTE — ED Notes (Signed)
Dr. Walden at bedside 

## 2014-09-20 ENCOUNTER — Telehealth: Payer: Self-pay | Admitting: Family Medicine

## 2014-09-20 NOTE — Telephone Encounter (Signed)
Patient scheduled for CPP for 2/22.

## 2014-09-20 NOTE — Telephone Encounter (Signed)
Pt called and would like Dr.Fletke to order her labs. She would like to check her A1C. Please call patient when done. jw

## 2014-09-20 NOTE — Telephone Encounter (Signed)
Note to nursing staff - patient due for yearly physical/preventative exam, please schedule, tell patient that we can get lab work at that time (please fast for appointment).

## 2014-10-03 ENCOUNTER — Encounter: Payer: Self-pay | Admitting: Family Medicine

## 2014-10-03 ENCOUNTER — Ambulatory Visit (INDEPENDENT_AMBULATORY_CARE_PROVIDER_SITE_OTHER): Payer: Managed Care, Other (non HMO) | Admitting: Family Medicine

## 2014-10-03 VITALS — Temp 98.0°F | Ht 62.0 in | Wt 194.3 lb

## 2014-10-03 DIAGNOSIS — Z23 Encounter for immunization: Secondary | ICD-10-CM

## 2014-10-03 DIAGNOSIS — Z0000 Encounter for general adult medical examination without abnormal findings: Secondary | ICD-10-CM

## 2014-10-03 DIAGNOSIS — I10 Essential (primary) hypertension: Secondary | ICD-10-CM

## 2014-10-03 DIAGNOSIS — R7302 Impaired glucose tolerance (oral): Secondary | ICD-10-CM

## 2014-10-03 HISTORY — DX: Encounter for general adult medical examination without abnormal findings: Z00.00

## 2014-10-03 HISTORY — DX: Morbid (severe) obesity due to excess calories: E66.01

## 2014-10-03 LAB — CBC
HCT: 33.1 % — ABNORMAL LOW (ref 36.0–46.0)
Hemoglobin: 10.3 g/dL — ABNORMAL LOW (ref 12.0–15.0)
MCH: 24.2 pg — ABNORMAL LOW (ref 26.0–34.0)
MCHC: 31.1 g/dL (ref 30.0–36.0)
MCV: 77.7 fL — AB (ref 78.0–100.0)
MPV: 11.4 fL (ref 8.6–12.4)
Platelets: 322 10*3/uL (ref 150–400)
RBC: 4.26 MIL/uL (ref 3.87–5.11)
RDW: 16.9 % — ABNORMAL HIGH (ref 11.5–15.5)
WBC: 4.1 10*3/uL (ref 4.0–10.5)

## 2014-10-03 LAB — LIPID PANEL
CHOL/HDL RATIO: 3.9 ratio
Cholesterol: 160 mg/dL (ref 0–200)
HDL: 41 mg/dL — ABNORMAL LOW (ref >=46)
LDL CALC: 107 mg/dL — AB (ref 0–99)
Triglycerides: 62 mg/dL (ref <150)
VLDL: 12 mg/dL (ref 0–40)

## 2014-10-03 LAB — COMPREHENSIVE METABOLIC PANEL
ALK PHOS: 59 U/L (ref 39–117)
ALT: 19 U/L (ref 0–35)
AST: 20 U/L (ref 0–37)
Albumin: 4.2 g/dL (ref 3.5–5.2)
BUN: 15 mg/dL (ref 6–23)
CO2: 22 meq/L (ref 19–32)
CREATININE: 0.65 mg/dL (ref 0.50–1.10)
Calcium: 9.4 mg/dL (ref 8.4–10.5)
Chloride: 104 mEq/L (ref 96–112)
Glucose, Bld: 99 mg/dL (ref 70–99)
Potassium: 3.8 mEq/L (ref 3.5–5.3)
SODIUM: 136 meq/L (ref 135–145)
TOTAL PROTEIN: 7 g/dL (ref 6.0–8.3)
Total Bilirubin: 0.4 mg/dL (ref 0.2–1.2)

## 2014-10-03 LAB — POCT GLYCOSYLATED HEMOGLOBIN (HGB A1C): Hemoglobin A1C: 5.6

## 2014-10-03 NOTE — Assessment & Plan Note (Addendum)
BMI 35. Has lost 10 lbs in past 6 months with dietary modifications -discussed continued dietary modifications and scheduled exercise -check A1c and lipid profile

## 2014-10-03 NOTE — Patient Instructions (Signed)
It was nice to see you today.  Please let Dr. Ree Kida know if the burning sensation in your arm continues or you have trouble swallowing.   Dr. Ree Kida will call you with your lab results.  Please follow up in September for Pap Smear.  Preventive Care for Adults A healthy lifestyle and preventive care can promote health and wellness. Preventive health guidelines for women include the following key practices.  A routine yearly physical is a good way to check with your health care provider about your health and preventive screening. It is a chance to share any concerns and updates on your health and to receive a thorough exam.  Visit your dentist for a routine exam and preventive care every 6 months. Brush your teeth twice a day and floss once a day. Good oral hygiene prevents tooth decay and gum disease.  The frequency of eye exams is based on your age, health, family medical history, use of contact lenses, and other factors. Follow your health care provider's recommendations for frequency of eye exams.  Eat a healthy diet. Foods like vegetables, fruits, whole grains, low-fat dairy products, and lean protein foods contain the nutrients you need without too many calories. Decrease your intake of foods high in solid fats, added sugars, and salt. Eat the right amount of calories for you.Get information about a proper diet from your health care provider, if necessary.  Regular physical exercise is one of the most important things you can do for your health. Most adults should get at least 150 minutes of moderate-intensity exercise (any activity that increases your heart rate and causes you to sweat) each week. In addition, most adults need muscle-strengthening exercises on 2 or more days a week.  Maintain a healthy weight. The body mass index (BMI) is a screening tool to identify possible weight problems. It provides an estimate of body fat based on height and weight. Your health care provider can  find your BMI and can help you achieve or maintain a healthy weight.For adults 20 years and older:  A BMI below 18.5 is considered underweight.  A BMI of 18.5 to 24.9 is normal.  A BMI of 25 to 29.9 is considered overweight.  A BMI of 30 and above is considered obese.  Maintain normal blood lipids and cholesterol levels by exercising and minimizing your intake of saturated fat. Eat a balanced diet with plenty of fruit and vegetables. Blood tests for lipids and cholesterol should begin at age 94 and be repeated every 5 years. If your lipid or cholesterol levels are high, you are over 50, or you are at high risk for heart disease, you may need your cholesterol levels checked more frequently.Ongoing high lipid and cholesterol levels should be treated with medicines if diet and exercise are not working.  If you smoke, find out from your health care provider how to quit. If you do not use tobacco, do not start.  Lung cancer screening is recommended for adults aged 24-80 years who are at high risk for developing lung cancer because of a history of smoking. A yearly low-dose CT scan of the lungs is recommended for people who have at least a 30-pack-year history of smoking and are a current smoker or have quit within the past 15 years. A pack year of smoking is smoking an average of 1 pack of cigarettes a day for 1 year (for example: 1 pack a day for 30 years or 2 packs a day for 15 years). Yearly screening  should continue until the smoker has stopped smoking for at least 15 years. Yearly screening should be stopped for people who develop a health problem that would prevent them from having lung cancer treatment.  If you are pregnant, do not drink alcohol. If you are breastfeeding, be very cautious about drinking alcohol. If you are not pregnant and choose to drink alcohol, do not have more than 1 drink per day. One drink is considered to be 12 ounces (355 mL) of beer, 5 ounces (148 mL) of wine, or 1.5  ounces (44 mL) of liquor.  Avoid use of street drugs. Do not share needles with anyone. Ask for help if you need support or instructions about stopping the use of drugs.  High blood pressure causes heart disease and increases the risk of stroke. Your blood pressure should be checked at least every 1 to 2 years. Ongoing high blood pressure should be treated with medicines if weight loss and exercise do not work.  If you are 32-25 years old, ask your health care provider if you should take aspirin to prevent strokes.  Diabetes screening involves taking a blood sample to check your fasting blood sugar level. This should be done once every 3 years, after age 62, if you are within normal weight and without risk factors for diabetes. Testing should be considered at a younger age or be carried out more frequently if you are overweight and have at least 1 risk factor for diabetes.  Breast cancer screening is essential preventive care for women. You should practice "breast self-awareness." This means understanding the normal appearance and feel of your breasts and may include breast self-examination. Any changes detected, no matter how small, should be reported to a health care provider. Women in their 74s and 30s should have a clinical breast exam (CBE) by a health care provider as part of a regular health exam every 1 to 3 years. After age 40, women should have a CBE every year. Starting at age 98, women should consider having a mammogram (breast X-ray test) every year. Women who have a family history of breast cancer should talk to their health care provider about genetic screening. Women at a high risk of breast cancer should talk to their health care providers about having an MRI and a mammogram every year.  Breast cancer gene (BRCA)-related cancer risk assessment is recommended for women who have family members with BRCA-related cancers. BRCA-related cancers include breast, ovarian, tubal, and peritoneal  cancers. Having family members with these cancers may be associated with an increased risk for harmful changes (mutations) in the breast cancer genes BRCA1 and BRCA2. Results of the assessment will determine the need for genetic counseling and BRCA1 and BRCA2 testing.  Routine pelvic exams to screen for cancer are no longer recommended for nonpregnant women who are considered low risk for cancer of the pelvic organs (ovaries, uterus, and vagina) and who do not have symptoms. Ask your health care provider if a screening pelvic exam is right for you.  If you have had past treatment for cervical cancer or a condition that could lead to cancer, you need Pap tests and screening for cancer for at least 20 years after your treatment. If Pap tests have been discontinued, your risk factors (such as having a new sexual partner) need to be reassessed to determine if screening should be resumed. Some women have medical problems that increase the chance of getting cervical cancer. In these cases, your health care provider may recommend  more frequent screening and Pap tests.  The HPV test is an additional test that may be used for cervical cancer screening. The HPV test looks for the virus that can cause the cell changes on the cervix. The cells collected during the Pap test can be tested for HPV. The HPV test could be used to screen women aged 57 years and older, and should be used in women of any age who have unclear Pap test results. After the age of 41, women should have HPV testing at the same frequency as a Pap test.  Colorectal cancer can be detected and often prevented. Most routine colorectal cancer screening begins at the age of 66 years and continues through age 12 years. However, your health care provider may recommend screening at an earlier age if you have risk factors for colon cancer. On a yearly basis, your health care provider may provide home test kits to check for hidden blood in the stool. Use of a  small camera at the end of a tube, to directly examine the colon (sigmoidoscopy or colonoscopy), can detect the earliest forms of colorectal cancer. Talk to your health care provider about this at age 46, when routine screening begins. Direct exam of the colon should be repeated every 5-10 years through age 41 years, unless early forms of pre-cancerous polyps or small growths are found.  People who are at an increased risk for hepatitis B should be screened for this virus. You are considered at high risk for hepatitis B if:  You were born in a country where hepatitis B occurs often. Talk with your health care provider about which countries are considered high risk.  Your parents were born in a high-risk country and you have not received a shot to protect against hepatitis B (hepatitis B vaccine).  You have HIV or AIDS.  You use needles to inject street drugs.  You live with, or have sex with, someone who has hepatitis B.  You get hemodialysis treatment.  You take certain medicines for conditions like cancer, organ transplantation, and autoimmune conditions.  Hepatitis C blood testing is recommended for all people born from [birth year] through 1965 and any individual with known risks for hepatitis C.  Practice safe sex. Use condoms and avoid high-risk sexual practices to reduce the spread of sexually transmitted infections (STIs). STIs include gonorrhea, chlamydia, syphilis, trichomonas, herpes, HPV, and human immunodeficiency virus (HIV). Herpes, HIV, and HPV are viral illnesses that have no cure. They can result in disability, cancer, and death.  You should be screened for sexually transmitted illnesses (STIs) including gonorrhea and chlamydia if:  You are sexually active and are younger than 24 years.  You are older than 24 years and your health care provider tells you that you are at risk for this type of infection.  Your sexual activity has changed since you were last screened and you are  at an increased risk for chlamydia or gonorrhea. Ask your health care provider if you are at risk.  If you are at risk of being infected with HIV, it is recommended that you take a prescription medicine daily to prevent HIV infection. This is called preexposure prophylaxis (PrEP). You are considered at risk if:  You are a heterosexual woman, are sexually active, and are at increased risk for HIV infection.  You take drugs by injection.  You are sexually active with a partner who has HIV.  Talk with your health care provider about whether you are at high risk  of being infected with HIV. If you choose to begin PrEP, you should first be tested for HIV. You should then be tested every 3 months for as long as you are taking PrEP.  Osteoporosis is a disease in which the bones lose minerals and strength with aging. This can result in serious bone fractures or breaks. The risk of osteoporosis can be identified using a bone density scan. Women ages 20 years and over and women at risk for fractures or osteoporosis should discuss screening with their health care providers. Ask your health care provider whether you should take a calcium supplement or vitamin D to reduce the rate of osteoporosis.  Menopause can be associated with physical symptoms and risks. Hormone replacement therapy is available to decrease symptoms and risks. You should talk to your health care provider about whether hormone replacement therapy is right for you.  Use sunscreen. Apply sunscreen liberally and repeatedly throughout the day. You should seek shade when your shadow is shorter than you. Protect yourself by wearing long sleeves, pants, a wide-brimmed hat, and sunglasses year round, whenever you are outdoors.  Once a month, do a whole body skin exam, using a mirror to look at the skin on your back. Tell your health care provider of new moles, moles that have irregular borders, moles that are larger than a pencil eraser, or moles  that have changed in shape or color.  Stay current with required vaccines (immunizations).  Influenza vaccine. All adults should be immunized every year.  Tetanus, diphtheria, and acellular pertussis (Td, Tdap) vaccine. Pregnant women should receive 1 dose of Tdap vaccine during each pregnancy. The dose should be obtained regardless of the length of time since the last dose. Immunization is preferred during the 27th-36th week of gestation. An adult who has not previously received Tdap or who does not know her vaccine status should receive 1 dose of Tdap. This initial dose should be followed by tetanus and diphtheria toxoids (Td) booster doses every 10 years. Adults with an unknown or incomplete history of completing a 3-dose immunization series with Td-containing vaccines should begin or complete a primary immunization series including a Tdap dose. Adults should receive a Td booster every 10 years.  Varicella vaccine. An adult without evidence of immunity to varicella should receive 2 doses or a second dose if she has previously received 1 dose. Pregnant females who do not have evidence of immunity should receive the first dose after pregnancy. This first dose should be obtained before leaving the health care facility. The second dose should be obtained 4-8 weeks after the first dose.  Human papillomavirus (HPV) vaccine. Females aged 13-26 years who have not received the vaccine previously should obtain the 3-dose series. The vaccine is not recommended for use in pregnant females. However, pregnancy testing is not needed before receiving a dose. If a female is found to be pregnant after receiving a dose, no treatment is needed. In that case, the remaining doses should be delayed until after the pregnancy. Immunization is recommended for any person with an immunocompromised condition through the age of 99 years if she did not get any or all doses earlier. During the 3-dose series, the second dose should be  obtained 4-8 weeks after the first dose. The third dose should be obtained 24 weeks after the first dose and 16 weeks after the second dose.  Zoster vaccine. One dose is recommended for adults aged 69 years or older unless certain conditions are present.  Measles, mumps,  and rubella (MMR) vaccine. Adults born before [birth year] generally are considered immune to measles and mumps. Adults born in [birth year] or later should have 1 or more doses of MMR vaccine unless there is a contraindication to the vaccine or there is laboratory evidence of immunity to each of the three diseases. A routine second dose of MMR vaccine should be obtained at least 28 days after the first dose for students attending postsecondary schools, health care workers, or international travelers. People who received inactivated measles vaccine or an unknown type of measles vaccine during 1963-1967 should receive 2 doses of MMR vaccine. People who received inactivated mumps vaccine or an unknown type of mumps vaccine before 1979 and are at high risk for mumps infection should consider immunization with 2 doses of MMR vaccine. For females of childbearing age, rubella immunity should be determined. If there is no evidence of immunity, females who are not pregnant should be vaccinated. If there is no evidence of immunity, females who are pregnant should delay immunization until after pregnancy. Unvaccinated health care workers born before [birth year] who lack laboratory evidence of measles, mumps, or rubella immunity or laboratory confirmation of disease should consider measles and mumps immunization with 2 doses of MMR vaccine or rubella immunization with 1 dose of MMR vaccine.  Pneumococcal 13-valent conjugate (PCV13) vaccine. When indicated, a person who is uncertain of her immunization history and has no record of immunization should receive the PCV13 vaccine. An adult aged 60 years or older who has certain medical conditions and has not been previously  immunized should receive 1 dose of PCV13 vaccine. This PCV13 should be followed with a dose of pneumococcal polysaccharide (PPSV23) vaccine. The PPSV23 vaccine dose should be obtained at least 8 weeks after the dose of PCV13 vaccine. An adult aged 82 years or older who has certain medical conditions and previously received 1 or more doses of PPSV23 vaccine should receive 1 dose of PCV13. The PCV13 vaccine dose should be obtained 1 or more years after the last PPSV23 vaccine dose.  Pneumococcal polysaccharide (PPSV23) vaccine. When PCV13 is also indicated, PCV13 should be obtained first. All adults aged 41 years and older should be immunized. An adult younger than age 92 years who has certain medical conditions should be immunized. Any person who resides in a nursing home or long-term care facility should be immunized. An adult smoker should be immunized. People with an immunocompromised condition and certain other conditions should receive both PCV13 and PPSV23 vaccines. People with human immunodeficiency virus (HIV) infection should be immunized as soon as possible after diagnosis. Immunization during chemotherapy or radiation therapy should be avoided. Routine use of PPSV23 vaccine is not recommended for American Indians, [place] Natives, or people younger than 65 years unless there are medical conditions that require PPSV23 vaccine. When indicated, people who have unknown immunization and have no record of immunization should receive PPSV23 vaccine. One-time revaccination 5 years after the first dose of PPSV23 is recommended for people aged 19-64 years who have chronic kidney failure, nephrotic syndrome, asplenia, or immunocompromised conditions. People who received 1-2 doses of PPSV23 before age 14 years should receive another dose of PPSV23 vaccine at age 14 years or later if at least 5 years have passed since the previous dose. Doses of PPSV23 are not needed for people immunized with PPSV23 at or after age 21  years.  Meningococcal vaccine. Adults with asplenia or persistent complement component deficiencies should receive 2 doses of quadrivalent meningococcal conjugate (MenACWY-D) vaccine. The doses  should be obtained at least 2 months apart. Microbiologists working with certain meningococcal bacteria, [place] recruits, people at risk during an outbreak, and people who travel to or live in countries with a high rate of meningitis should be immunized. A first-year college student up through age 54 years who is living in a residence hall should receive a dose if she did not receive a dose on or after her 16th birthday. Adults who have certain high-risk conditions should receive one or more doses of vaccine.  Hepatitis A vaccine. Adults who wish to be protected from this disease, have certain high-risk conditions, work with hepatitis A-infected animals, work in hepatitis A research labs, or travel to or work in countries with a high rate of hepatitis A should be immunized. Adults who were previously unvaccinated and who anticipate close contact with an international adoptee during the first 60 days after arrival in the [place] from a country with a high rate of hepatitis A should be immunized.  Hepatitis B vaccine. Adults who wish to be protected from this disease, have certain high-risk conditions, may be exposed to blood or other infectious body fluids, are household contacts or sex partners of hepatitis B positive people, are clients or workers in certain care facilities, or travel to or work in countries with a high rate of hepatitis B should be immunized.  Haemophilus influenzae type b (Hib) vaccine. A previously unvaccinated person with asplenia or sickle cell disease or having a scheduled splenectomy should receive 1 dose of Hib vaccine. Regardless of previous immunization, a recipient of a hematopoietic stem cell transplant should receive a 3-dose series 6-12 months after her successful transplant.  Hib vaccine is not recommended for adults with HIV infection. Preventive Services / Frequency Ages 53 to 29 years  Blood pressure check.** / Every 1 to 2 years.  Lipid and cholesterol check.** / Every 5 years beginning at age 91.  Clinical breast exam.** / Every 3 years for women in their 44s and 20s.  BRCA-related cancer risk assessment.** / For women who have family members with a BRCA-related cancer (breast, ovarian, tubal, or peritoneal cancers).  Pap test.** / Every 2 years from ages 17 through 86. Every 3 years starting at age 68 through age 44 or 17 with a history of 3 consecutive normal Pap tests.  HPV screening.** / Every 3 years from ages 70 through ages 80 to 21 with a history of 3 consecutive normal Pap tests.  Hepatitis C blood test.** / For any individual with known risks for hepatitis C.  Skin self-exam. / Monthly.  Influenza vaccine. / Every year.  Tetanus, diphtheria, and acellular pertussis (Tdap, Td) vaccine.** / Consult your health care provider. Pregnant women should receive 1 dose of Tdap vaccine during each pregnancy. 1 dose of Td every 10 years.  Varicella vaccine.** / Consult your health care provider. Pregnant females who do not have evidence of immunity should receive the first dose after pregnancy.  HPV vaccine. / 3 doses over 6 months, if 61 and younger. The vaccine is not recommended for use in pregnant females. However, pregnancy testing is not needed before receiving a dose.  Measles, mumps, rubella (MMR) vaccine.** / You need at least 1 dose of MMR if you were born in 1957 or later. You may also need a 2nd dose. For females of childbearing age, rubella immunity should be determined. If there is no evidence of immunity, females who are not pregnant should be vaccinated. If there is no evidence  of immunity, females who are pregnant should delay immunization until after pregnancy.  Pneumococcal 13-valent conjugate (PCV13) vaccine.** / Consult your health  care provider.  Pneumococcal polysaccharide (PPSV23) vaccine.** / 1 to 2 doses if you smoke cigarettes or if you have certain conditions.  Meningococcal vaccine.** / 1 dose if you are age 28 to 91 years and a Market researcher living in a residence hall, or have one of several medical conditions, you need to get vaccinated against meningococcal disease. You may also need additional booster doses.  Hepatitis A vaccine.** / Consult your health care provider.  Hepatitis B vaccine.** / Consult your health care provider.  Haemophilus influenzae type b (Hib) vaccine.** / Consult your health care provider. Ages 35 to 45 years  Blood pressure check.** / Every 1 to 2 years.  Lipid and cholesterol check.** / Every 5 years beginning at age 50 years.  Lung cancer screening. / Every year if you are aged 70-80 years and have a 30-pack-year history of smoking and currently smoke or have quit within the past 15 years. Yearly screening is stopped once you have quit smoking for at least 15 years or develop a health problem that would prevent you from having lung cancer treatment.  Clinical breast exam.** / Every year after age 44 years.  BRCA-related cancer risk assessment.** / For women who have family members with a BRCA-related cancer (breast, ovarian, tubal, or peritoneal cancers).  Mammogram.** / Every year beginning at age 88 years and continuing for as long as you are in good health. Consult with your health care provider.  Pap test.** / Every 3 years starting at age 26 years through age 40 or 56 years with a history of 3 consecutive normal Pap tests.  HPV screening.** / Every 3 years from ages 61 years through ages 67 to 1 years with a history of 3 consecutive normal Pap tests.  Fecal occult blood test (FOBT) of stool. / Every year beginning at age 37 years and continuing until age 33 years. You may not need to do this test if you get a colonoscopy every 10 years.  Flexible  sigmoidoscopy or colonoscopy.** / Every 5 years for a flexible sigmoidoscopy or every 10 years for a colonoscopy beginning at age 97 years and continuing until age 22 years.  Hepatitis C blood test.** / For all people born from [birth year] through 1964 and any individual with known risks for hepatitis C.  Skin self-exam. / Monthly.  Influenza vaccine. / Every year.  Tetanus, diphtheria, and acellular pertussis (Tdap/Td) vaccine.** / Consult your health care provider. Pregnant women should receive 1 dose of Tdap vaccine during each pregnancy. 1 dose of Td every 10 years.  Varicella vaccine.** / Consult your health care provider. Pregnant females who do not have evidence of immunity should receive the first dose after pregnancy.  Zoster vaccine.** / 1 dose for adults aged 10 years or older.  Measles, mumps, rubella (MMR) vaccine.** / You need at least 1 dose of MMR if you were born in 1957 or later. You may also need a 2nd dose. For females of childbearing age, rubella immunity should be determined. If there is no evidence of immunity, females who are not pregnant should be vaccinated. If there is no evidence of immunity, females who are pregnant should delay immunization until after pregnancy.  Pneumococcal 13-valent conjugate (PCV13) vaccine.** / Consult your health care provider.  Pneumococcal polysaccharide (PPSV23) vaccine.** / 1 to 2 doses if you smoke cigarettes or if  you have certain conditions.  Meningococcal vaccine.** / Consult your health care provider.  Hepatitis A vaccine.** / Consult your health care provider.  Hepatitis B vaccine.** / Consult your health care provider.  Haemophilus influenzae type b (Hib) vaccine.** / Consult your health care provider. Ages 66 years and over  Blood pressure check.** / Every 1 to 2 years.  Lipid and cholesterol check.** / Every 5 years beginning at age 53 years.  Lung cancer screening. / Every year if you are aged 36-80 years and have a  30-pack-year history of smoking and currently smoke or have quit within the past 15 years. Yearly screening is stopped once you have quit smoking for at least 15 years or develop a health problem that would prevent you from having lung cancer treatment.  Clinical breast exam.** / Every year after age 71 years.  BRCA-related cancer risk assessment.** / For women who have family members with a BRCA-related cancer (breast, ovarian, tubal, or peritoneal cancers).  Mammogram.** / Every year beginning at age 77 years and continuing for as long as you are in good health. Consult with your health care provider.  Pap test.** / Every 3 years starting at age 50 years through age 39 or 39 years with 3 consecutive normal Pap tests. Testing can be stopped between 65 and 70 years with 3 consecutive normal Pap tests and no abnormal Pap or HPV tests in the past 10 years.  HPV screening.** / Every 3 years from ages 50 years through ages 9 or 85 years with a history of 3 consecutive normal Pap tests. Testing can be stopped between 65 and 70 years with 3 consecutive normal Pap tests and no abnormal Pap or HPV tests in the past 10 years.  Fecal occult blood test (FOBT) of stool. / Every year beginning at age 43 years and continuing until age 2 years. You may not need to do this test if you get a colonoscopy every 10 years.  Flexible sigmoidoscopy or colonoscopy.** / Every 5 years for a flexible sigmoidoscopy or every 10 years for a colonoscopy beginning at age 41 years and continuing until age 1 years.  Hepatitis C blood test.** / For all people born from [birth year] through 1964 and any individual with known risks for hepatitis C.  Osteoporosis screening.** / A one-time screening for women ages 19 years and over and women at risk for fractures or osteoporosis.  Skin self-exam. / Monthly.  Influenza vaccine. / Every year.  Tetanus, diphtheria, and acellular pertussis (Tdap/Td) vaccine.** / 1 dose of Td every 10  years.  Varicella vaccine.** / Consult your health care provider.  Zoster vaccine.** / 1 dose for adults aged 63 years or older.  Pneumococcal 13-valent conjugate (PCV13) vaccine.** / Consult your health care provider.  Pneumococcal polysaccharide (PPSV23) vaccine.** / 1 dose for all adults aged 45 years and older.  Meningococcal vaccine.** / Consult your health care provider.  Hepatitis A vaccine.** / Consult your health care provider.  Hepatitis B vaccine.** / Consult your health care provider.  Haemophilus influenzae type b (Hib) vaccine.** / Consult your health care provider. ** Family history and personal history of risk and conditions may change your health care provider's recommendations. Document Released: 09/24/2001 Document Revised: 12/13/2013 Document Reviewed: 12/24/2010 [facility] Patient Information 2015 [place], [place]. This information is not intended to replace advice given to you by your health care provider. Make sure you discuss any questions you have with your health care provider.

## 2014-10-03 NOTE — Progress Notes (Addendum)
44 y.o. year old female presents for well woman/preventative visit and annual GYN examination.  Acute Concerns: 1. Burning sensation in left forearm, only present for the past few days, intermittent, no inciting event, no current symptoms, no associated neck pain or weakness; 2. 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 Marital Status: Single    Spouse Name: N/A  . Number of Children: 2  . Years of Education: 59 GED   Occupational History  . NURSING ASSIST    Social History Main Topics  . Smoking status: Former Smoker    Quit date: 01/30/1994  . Smokeless tobacco: Not on file  . Alcohol Use: No     Comment: quit 1995  . Drug Use: No  . Sexual Activity: Not Currently   Other Topics Concern  . None   Social History Narrative   Never married, celebate since 2000   Son, Gaastra, odd jobs, living at home   Son, Engineer, civil (consulting), Ship broker at [employer], living at home   [place] since 01/2012, [employer]   Employed at [employer]   Attends [employer]:  Tdap/TD: 2006  Influenza: 2015  Pneumococcal: none  Herpes Zoster: none  Cancer Screening:  Pap Smear: 04/2012, no FH of cervical cancer, no previous HPV   Mammogram: 01/2014, had [place] due to nipple drainage, both negative  Colonoscopy: none  Dexa: none  Physical Exam: VITALS: reviewed UKG:URKYHCWC female, NAD HEENT: PERRL, EOMI, no scleral icterus, MMM, neck supple, no thyromegaly, no anterior or posterior cervical lymphadenopathy CARDIAC: RRR, S1 and S2 present, no  murmurs RESP: CTAB, normal effort BREAST:Exam performed in the presence of a chaperone. No mass, no nipple discharge, no axillary adenopathy ABD: soft, no tenderness, normal bowel sounds GU/GYN: Deferred until pap which is due in 04/2015 MSK: no cervical neck tenderness, cervical ROM is full, no sensation deficits to LUE, grip strength 5/5 EXT: trace edema SKIN: no suspicious skin lesions  ASSESSMENT & PLAN: 44 y.o. female presents for annual well woman/preventative exam and GYN exam. Please see problem specific assessment and plan.    In regards to arm and throat complaints, as physical exam is unremarkable and patient currently asymptomatic will defer further workup at this time. Monitor at subsequent visits.

## 2014-10-03 NOTE — Assessment & Plan Note (Signed)
44 y/o female presents for preventative health visit. -up to date on immunizations after Tdap provided today -up to date on Mammogram -Due for pap smear in 04/2015 -Patient has information on living will -discussed lifestyle modifications -Not a candidate for colonoscopy or DEXA

## 2014-10-05 ENCOUNTER — Telehealth: Payer: Self-pay | Admitting: Family Medicine

## 2014-10-05 DIAGNOSIS — D509 Iron deficiency anemia, unspecified: Secondary | ICD-10-CM

## 2014-10-05 NOTE — Telephone Encounter (Signed)
Discussed lab results. Patient reports noncompliance with daily iron due to GI upset. Reports monthly periods that last 7 days, are somewhat heavy, no intermittent spotting. Patient counseled to take iron with food.

## 2014-10-05 NOTE — Assessment & Plan Note (Signed)
HBG slightly low at 10.6. Patient not compliant with iron. Does report heavy periods, No GI bleeding, had colonoscopy in 2010 which identified benign polyps.  -counseled to take daily iron with food -may need to regulate periods if continues to have anemia despite iron use.

## 2014-10-11 ENCOUNTER — Ambulatory Visit: Payer: Managed Care, Other (non HMO) | Admitting: Family Medicine

## 2014-10-17 ENCOUNTER — Ambulatory Visit: Payer: Managed Care, Other (non HMO) | Admitting: Family Medicine

## 2014-11-01 ENCOUNTER — Encounter (HOSPITAL_BASED_OUTPATIENT_CLINIC_OR_DEPARTMENT_OTHER): Payer: Self-pay | Admitting: *Deleted

## 2014-11-01 ENCOUNTER — Emergency Department (HOSPITAL_BASED_OUTPATIENT_CLINIC_OR_DEPARTMENT_OTHER): Payer: Managed Care, Other (non HMO)

## 2014-11-01 ENCOUNTER — Other Ambulatory Visit: Payer: Self-pay

## 2014-11-01 ENCOUNTER — Emergency Department (HOSPITAL_BASED_OUTPATIENT_CLINIC_OR_DEPARTMENT_OTHER)
Admission: EM | Admit: 2014-11-01 | Discharge: 2014-11-01 | Disposition: A | Discharge status: Home / Self Care | Payer: Managed Care, Other (non HMO) | Attending: Emergency Medicine | Admitting: Emergency Medicine

## 2014-11-01 DIAGNOSIS — Z79899 Other long term (current) drug therapy: Secondary | ICD-10-CM | POA: Insufficient documentation

## 2014-11-01 DIAGNOSIS — Z3202 Encounter for pregnancy test, result negative: Secondary | ICD-10-CM | POA: Insufficient documentation

## 2014-11-01 DIAGNOSIS — Z9079 Acquired absence of other genital organ(s): Secondary | ICD-10-CM | POA: Diagnosis not present

## 2014-11-01 DIAGNOSIS — Z87891 Personal history of nicotine dependence: Secondary | ICD-10-CM | POA: Insufficient documentation

## 2014-11-01 DIAGNOSIS — M1990 Unspecified osteoarthritis, unspecified site: Secondary | ICD-10-CM | POA: Insufficient documentation

## 2014-11-01 DIAGNOSIS — I10 Essential (primary) hypertension: Secondary | ICD-10-CM | POA: Insufficient documentation

## 2014-11-01 DIAGNOSIS — Z9889 Other specified postprocedural states: Secondary | ICD-10-CM | POA: Insufficient documentation

## 2014-11-01 DIAGNOSIS — Z8639 Personal history of other endocrine, nutritional and metabolic disease: Secondary | ICD-10-CM | POA: Diagnosis not present

## 2014-11-01 DIAGNOSIS — R1032 Left lower quadrant pain: Secondary | ICD-10-CM | POA: Diagnosis present

## 2014-11-01 DIAGNOSIS — R1033 Periumbilical pain: Secondary | ICD-10-CM

## 2014-11-01 LAB — URINALYSIS, ROUTINE W REFLEX MICROSCOPIC
Bilirubin Urine: NEGATIVE
GLUCOSE, UA: NEGATIVE mg/dL
Hgb urine dipstick: NEGATIVE
Ketones, ur: NEGATIVE mg/dL
Leukocytes, UA: NEGATIVE
Nitrite: NEGATIVE
PH: 6.5 (ref 5.0–8.0)
Protein, ur: NEGATIVE mg/dL
Specific Gravity, Urine: 1.028 (ref 1.005–1.030)
Urobilinogen, UA: 2 mg/dL — ABNORMAL HIGH (ref 0.0–1.0)

## 2014-11-01 LAB — PREGNANCY, URINE: PREG TEST UR: NEGATIVE

## 2014-11-01 IMAGING — CR DG CHEST 2V
2 series · 2 of 2 positions shown · non-contrast
Comparison: [DATE]

CLINICAL DATA: LEFT lower abdominal pain since yesterday,
hypertension

EXAM:
CHEST  2 VIEW

[w chest pa]
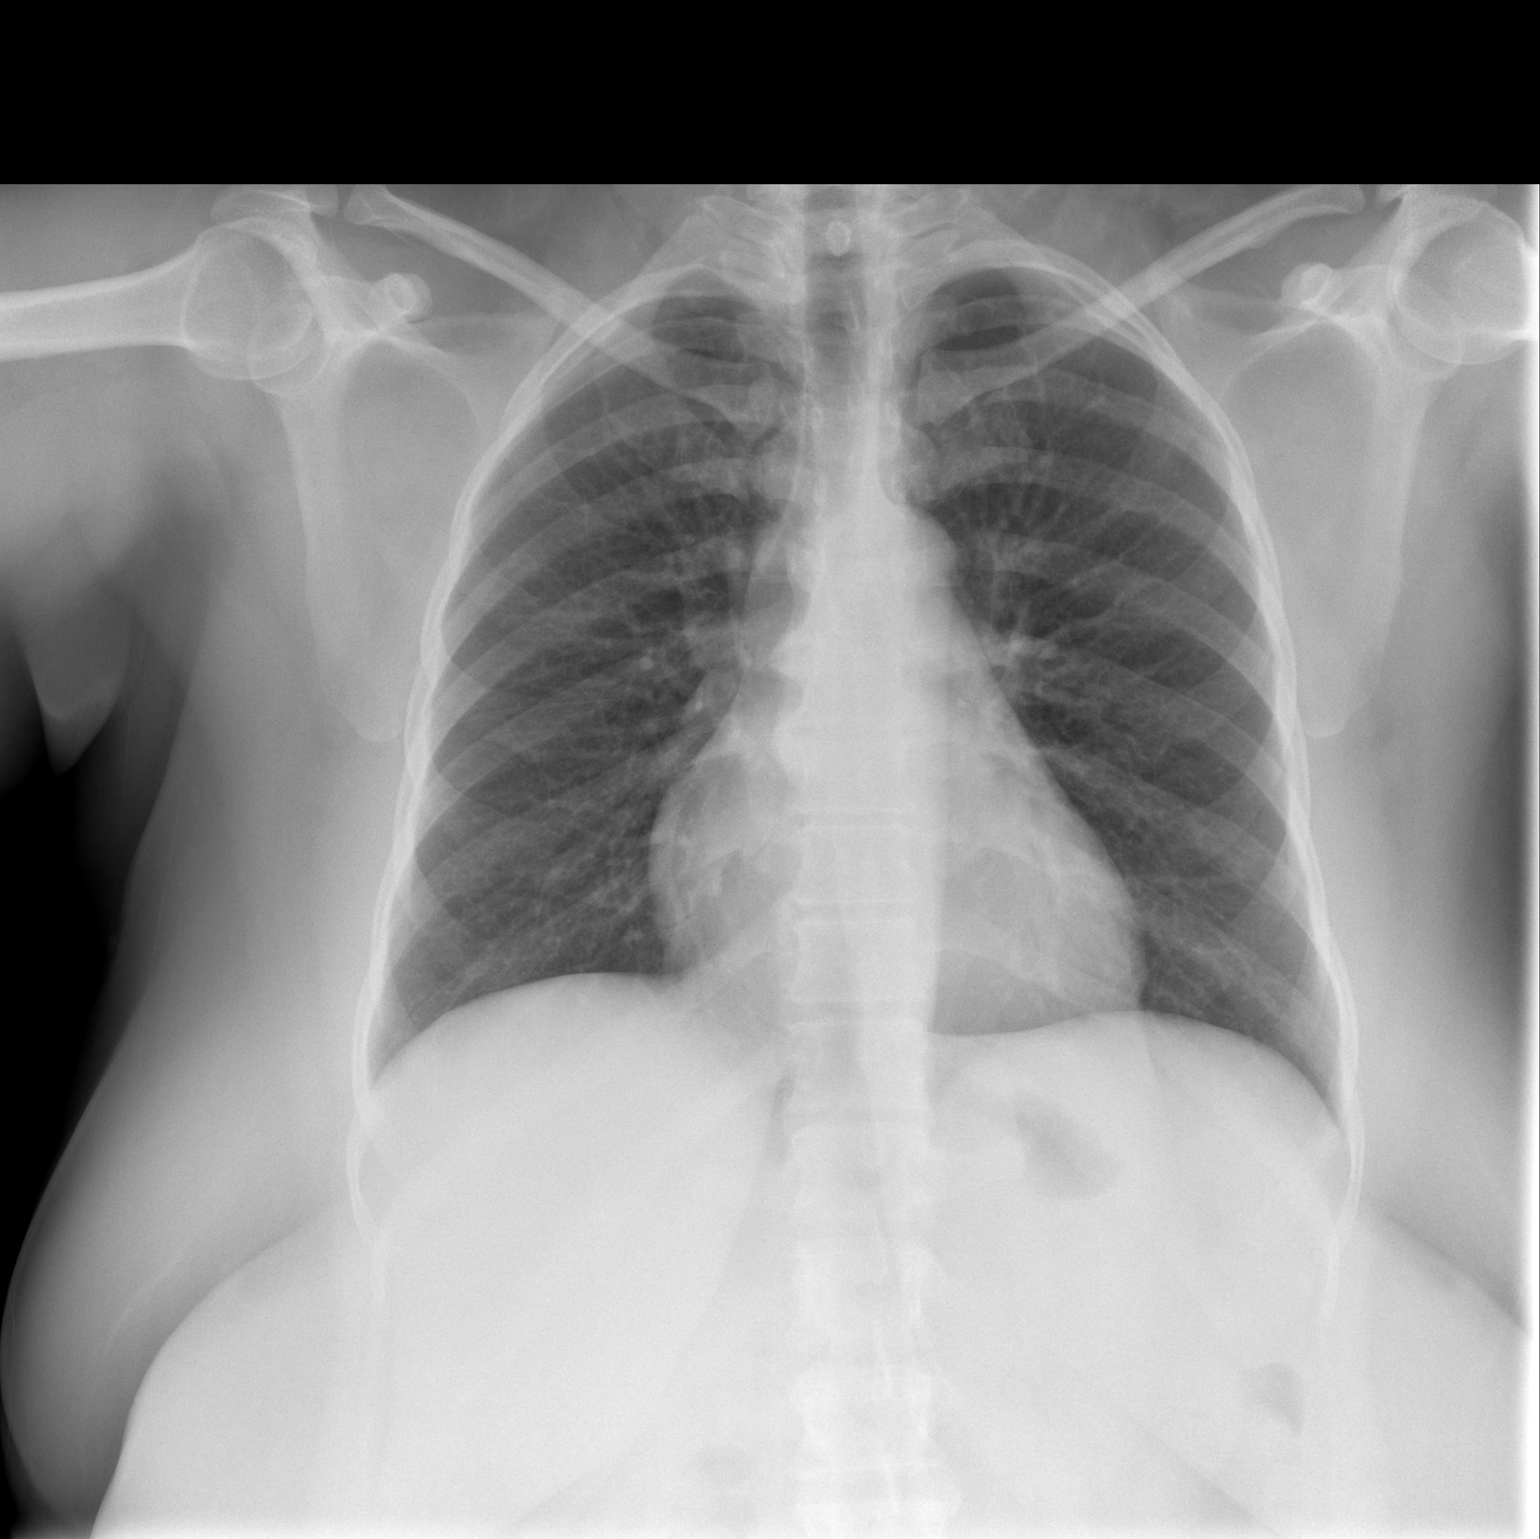

[w chest lat]
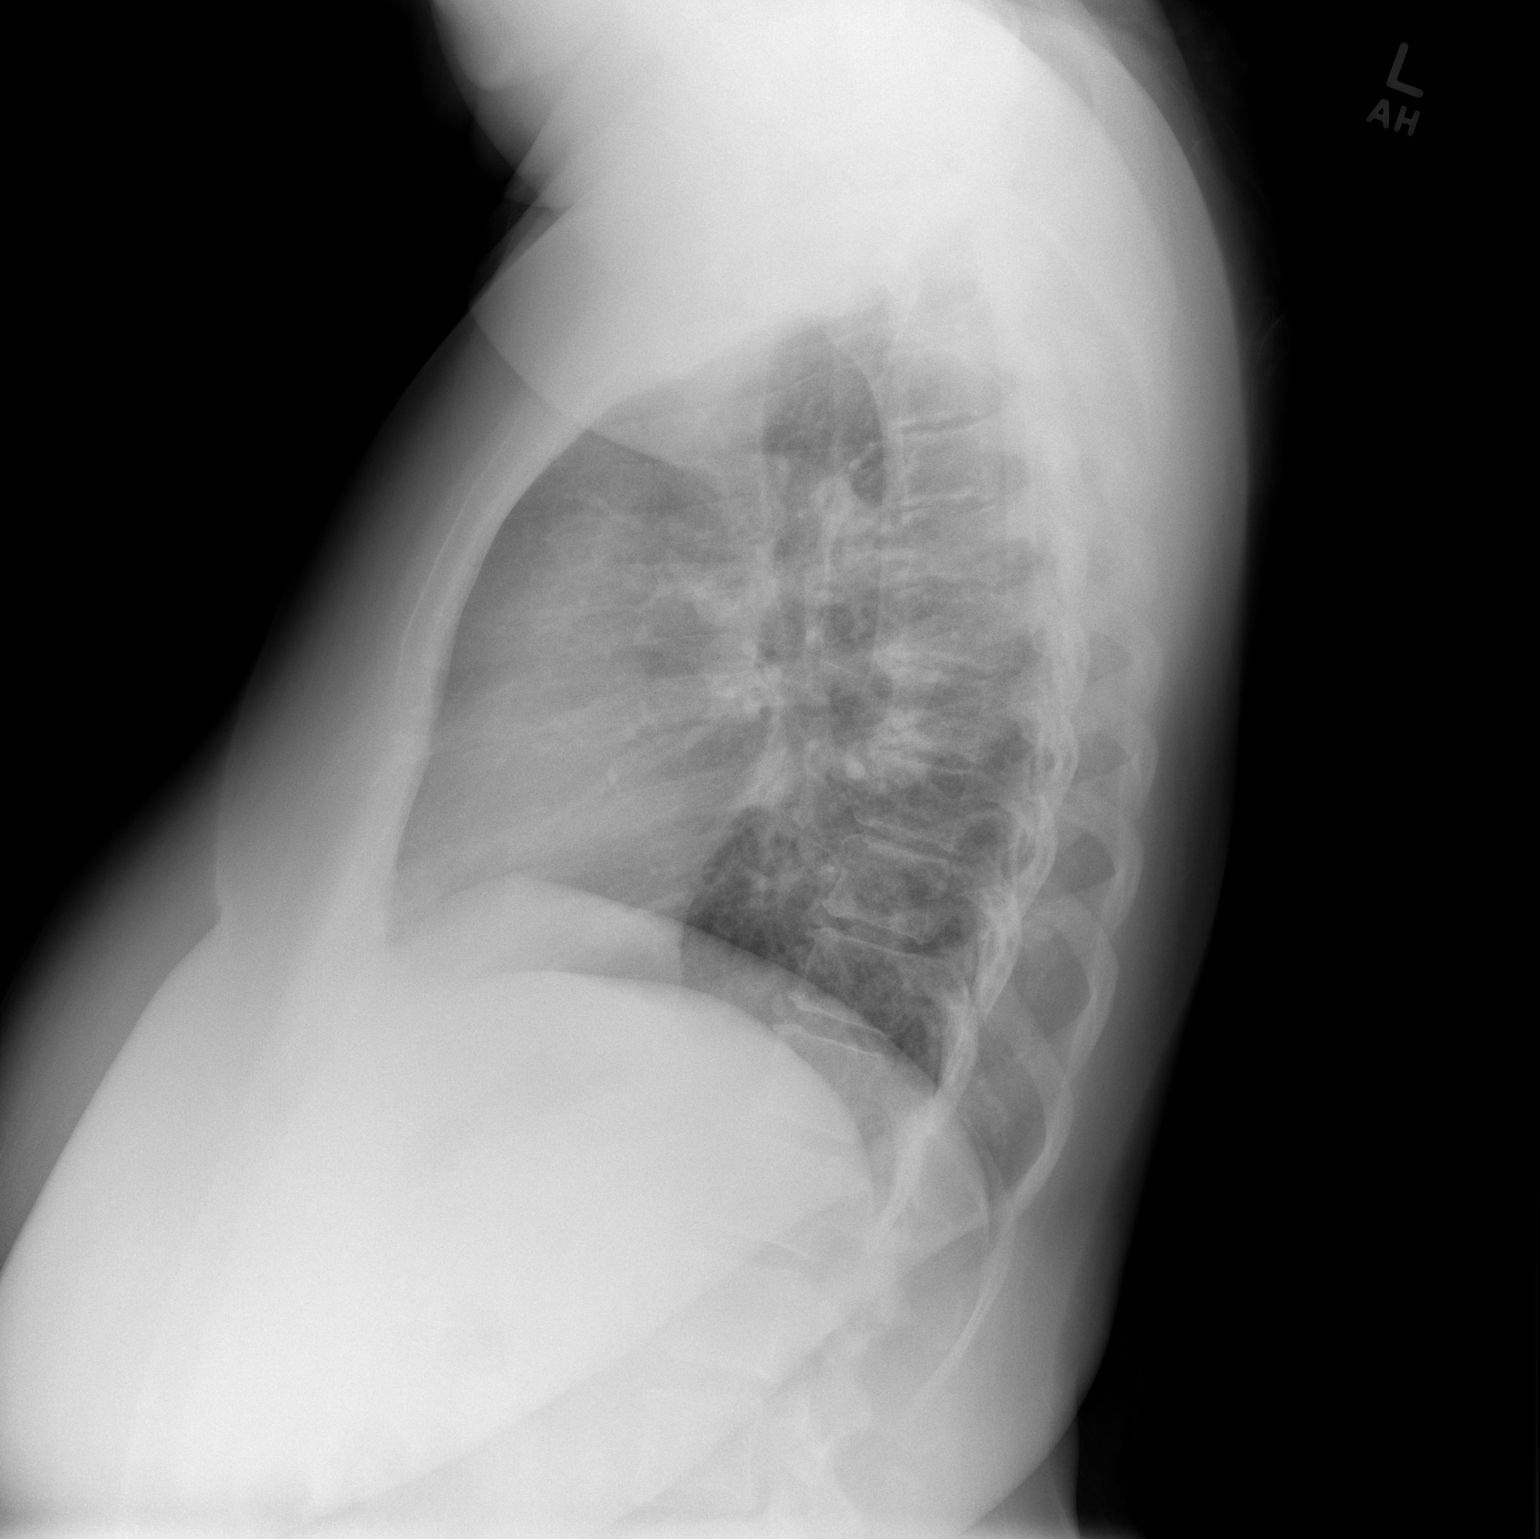

[2 of 2 positions shown; findings below may reference images not displayed]

FINDINGS: Upper normal size of cardiac silhouette.

Mediastinal contours and pulmonary vascularity normal.

Lungs clear.

No pleural effusion or pneumothorax.

Bones unremarkable.

No free air.
IMPRESSION: No acute abnormalities.

## 2014-11-01 NOTE — ED Notes (Signed)
MD at bedside. 

## 2014-11-01 NOTE — ED Notes (Signed)
Pt c/o lower left abd pain x2 days. No relief from OTC gas meds.

## 2014-11-01 NOTE — Discharge Instructions (Signed)

## 2014-11-01 NOTE — ED Provider Notes (Signed)
CSN: [id number]     Arrival date & time 11/01/14  1728 History  This chart was scribed for Debby Freiberg, MD by Starleen Arms, ED Scribe. This patient was seen in room MH05/MH05 and the patient's care was started at [facility].   Chief Complaint  Patient presents with  . Abdominal Pain   The history is provided by the patient. No language interpreter was used.    HPI Comments: Adrienne Collins is a 44 y.o. female who presents to the Emergency Department complaining of constant lower left abdominal pain onset yesterday.  She reports the pain is worsened by certain motions and inspiration and relieved by remaining still.  It is not worsened by eating.  She reports an associated, currently resolved fever yesterday TMAX 100.4.  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  She denies previous similar symptoms. She reports a history of ETT, HTN.  She denies history of abdominal surgeries.  Patient denies cough, SOB, n/v/d.   PCP: Dr. Ree Kida   Past Medical History  Diagnosis Date  . History of ETT 08/2006    no sustained tachycardia  . S/P colonoscopy 10/27/2008    [place]  . Hypertension   . Prediabetes   . Arthritis    Past Surgical History  Procedure Laterality Date  . Cesarean section  1990  . Cesarean section  1991  . Salpingectomy      right   Family History  Problem Relation Age of Onset  . Diabetes Mother   . Hypertension Mother   . Diabetes Brother     diabetes, CVA  . Stroke Brother   . Stroke Sister     drug abuse   History  Substance Use Topics  . Smoking status: Former Smoker    Quit date: 01/30/1994  . Smokeless tobacco: Not on file  . Alcohol Use: No     Comment: quit 1995   OB History    No data available     Review of Systems  Constitutional: Negative for fever.  Respiratory: Negative for cough and shortness of breath.   Gastrointestinal: Positive for abdominal pain. Negative for nausea, vomiting and  diarrhea.  All other systems reviewed and are negative.     Allergies  Review of patient's allergies indicates no known allergies.  Home Medications   Prior to Admission medications   Medication Sig Start Date End Date Taking? Authorizing Provider  Enalapril-Hydrochlorothiazide 5-12.5 MG per tablet TAKE ONE TABLET BY MOUTH ONCE DAILY 07/19/14  Yes Lupita Dawn, MD  ferrous sulfate 325 (65 FE) MG tablet Take 325 mg by mouth daily with breakfast.   Yes Historical Provider, MD  metoprolol succinate (TOPROL-XL) 50 MG 24 hr tablet TAKE ONE TABLET BY MOUTH EVERY DAY 06/20/14  Yes Lupita Dawn, MD  acetaminophen (TYLENOL) 650 MG CR tablet Take 2 tablets (1,300 mg total) by mouth every 8 (eight) hours as needed for pain. take 2 tabs every 6 hours as needed for pain 10/24/10   Candelaria Celeste, MD  ibuprofen (ADVIL,MOTRIN) 200 MG tablet Take 800 mg by mouth every 6 (six) hours as needed for pain.    Historical Provider, MD   BP 125/72 mmHg  Pulse 70  Temp(Src) 98.2 F (36.8 C) (Oral)  Resp 18  Ht 5\' 2"  (1.575 m)  Wt 195 lb (88.451 kg)  BMI 35.66 kg/m2  SpO2 100%  LMP 10/24/2014 (Approximate) Physical Exam  Constitutional: She is  oriented to person, place, and time. She appears well-developed and well-nourished.  HENT:  Head: Normocephalic and atraumatic.  Right Ear: External ear normal.  Left Ear: External ear normal.  Eyes: Conjunctivae and EOM are normal. Pupils are equal, round, and reactive to light.  Neck: Normal range of motion. Neck supple.  Cardiovascular: Normal rate, regular rhythm, normal heart sounds and intact distal pulses.   Pulmonary/Chest: Effort normal and breath sounds normal.  Abdominal: Soft. Bowel sounds are normal. There is tenderness (L) in the periumbilical area.  Musculoskeletal: Normal range of motion.  Neurological: She is alert and oriented to person, place, and time.  Skin: Skin is warm and dry.  Vitals reviewed.   ED Course  Procedures (including critical  care time)  DIAGNOSTIC STUDIES: Oxygen Saturation is 100% on RA, normal by my interpretation.    COORDINATION OF CARE:  6:18 PM Discussed treatment plan with patient at bedside.  Patient acknowledges and agrees with plan.    Labs Review Labs Reviewed  URINALYSIS, ROUTINE W REFLEX MICROSCOPIC - Abnormal; Notable for the following:    Urobilinogen, UA 2.0 (*)    All other components within normal limits  PREGNANCY, URINE    Imaging Review Dg Chest 2 View  11/01/2014   CLINICAL DATA:  LEFT lower abdominal pain since yesterday, hypertension  EXAM: CHEST  2 VIEW  COMPARISON:  07/24/2014  FINDINGS: Upper normal size of cardiac silhouette.  Mediastinal contours and pulmonary vascularity normal.  Lungs clear.  No pleural effusion or pneumothorax.  Bones unremarkable.  No free air.  IMPRESSION: No acute abnormalities.   Electronically Signed   By: Lavonia Dana M.D.   On: 11/01/2014 18:30     EKG Interpretation   Date/Time:  Tuesday November 01 2014 18:34:52 EDT Ventricular Rate:  69 PR Interval:  154 QRS Duration: 74 QT Interval:  394 QTC Calculation: 422 R Axis:   -23 Text Interpretation:  Normal sinus rhythm Normal ECG No significant change  since last tracing Confirmed by Debby Freiberg [phone]) on 11/01/2014  6:36:21 PM      MDM   Final diagnoses:  Periumbilical abdominal pain    44 y.o. female without pertinent PMH presents with left periumbilical abdominal pain as above. Patient states that she is not short of breath, however does have some worsening with change of position and inspiration.  No frank chest pain. No GI symptoms. On arrival patient has vital signs and physical exam as above. She is well-appearing. Workup as above unremarkable. No hematuria suggesting nephrolithiasis. We'll have the patient follow-up tomorrow. She has planned follow-up. Discharged home in stable condition.    I have reviewed all laboratory and imaging studies if ordered as above  1. Periumbilical  abdominal pain           Debby Freiberg, MD 11/01/14 [phone]

## 2014-11-02 ENCOUNTER — Encounter: Payer: Self-pay | Admitting: Family Medicine

## 2014-11-02 ENCOUNTER — Ambulatory Visit (INDEPENDENT_AMBULATORY_CARE_PROVIDER_SITE_OTHER): Payer: Managed Care, Other (non HMO) | Admitting: Family Medicine

## 2014-11-02 VITALS — BP 116/58 | HR 54 | Temp 98.2°F | Ht 62.0 in | Wt 195.7 lb

## 2014-11-02 DIAGNOSIS — K5900 Constipation, unspecified: Secondary | ICD-10-CM | POA: Diagnosis not present

## 2014-11-02 HISTORY — DX: Constipation, unspecified: K59.00

## 2014-11-02 MED ORDER — POLYETHYLENE GLYCOL 3350 17 GM/SCOOP PO POWD: ORAL | Status: DC

## 2014-11-02 NOTE — Progress Notes (Signed)
   Subjective:    Patient ID: Adrienne Collins, female    DOB: 14-Apr-1971, 44 y.o.   MRN: [id number]  HPI  Left-sided abdominal pain: She presents to same day appointment after emergency room visit for constant left lower abdominal pain that started 2 days ago. She felt the pain was worse with movement, and laying on her left side. He did not feel it's associated with food intake. She did have a temperature of 100.4 on Monday, but no temperature since. 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 She denies previous similar symptoms. She states that since her exam 2 days ago she has been slowly improving. She does admit to mild left lower quadrant pain, but states it is much improved. She has restarted her iron supplementation for her microcytic anemia at the end of February. She reports daily bowel movements that are very small and hard. She denies any melena or hematochezia. She denies any dysuria, frequency or urinary changes. She reports a history of ETT, HTN, right salpingectomy for hydrosalpinx. Patient denies cough, SOB, n/v/d. She had a colonoscopy in 2010 with 2 sessile polyps, scattered diverticuli in the right colon and moderate diverticuli in the sigmoid colon, polyps were biopsied and proven to be benign.  ED workup: UA with increased urobilinogen 2.0, negative pregnancy test, CBC with mild microcytic anemia which has been consistent with her past studies, mild hyponatremia 133, mild hypo-chloremia 94, mild hypokalemia 3.4, A1c 5.4.  Former smoker Past Medical History  Diagnosis Date  . History of ETT 08/2006    no sustained tachycardia  . S/P colonoscopy 10/27/2008    [place]  . Hypertension   . Prediabetes   . Arthritis    No Known Allergies  Review of Systems     Objective:   Physical Exam BP 116/58 mmHg  Pulse 54  Temp(Src) 98.2 F (36.8 C) (Oral)  Ht 5\' 2"  (1.575 m)  Wt 195 lb 11.2  oz (88.769 kg)  BMI 35.78 kg/m2  LMP 10/24/2014 (Approximate) Gen: NAD. Toxic in appearance, well-developed, well-nourished, pleasant African-American female, obese HEENT: AT. [place]. Bilateral eyes without injections or icterus. MMM. Abd: Soft. Obese. ND. Mild Tenderness left lower quadrant BS present. No Masses palpated.      Assessment & Plan:

## 2014-11-02 NOTE — Assessment & Plan Note (Signed)
I believe patient's left-sided abdominal discomfort is coming from either constipation or her history of diverticula l or both, especially with the recent restart of iron. Advised patient to use Miralax daily, and I have prescribed her this medication. Drink 80 ounces of water a day, and increase her fiber intake to 40 g daily. Discussed red flags with patient, and when to follow-up (fever, increased abdominal pain, blood in her stool) Patient in agreement, advised patient if she is not improved she needs to follow-up in 2 weeks with her PCP

## 2014-11-02 NOTE — Patient Instructions (Signed)
Constipation  Constipation is when a person has fewer than three bowel movements a week, has difficulty having a bowel movement, or has stools that are dry, hard, or larger than normal. As people grow older, constipation is more common. If you try to fix constipation with medicines that make you have a bowel movement (laxatives), the problem may get worse. Long-term laxative use may cause the muscles of the colon to become weak. A low-fiber diet, not taking in enough fluids, and taking certain medicines may make constipation worse.   CAUSES   · Certain medicines, such as antidepressants, pain medicine, iron supplements, antacids, and water pills.    · Certain diseases, such as diabetes, irritable bowel syndrome (IBS), thyroid disease, or depression.    · Not drinking enough water.    · Not eating enough fiber-rich foods.    · Stress or travel.    · Lack of physical activity or exercise.    · Ignoring the urge to have a bowel movement.    · Using laxatives too much.    SIGNS AND SYMPTOMS   · Having fewer than three bowel movements a week.    · Straining to have a bowel movement.    · Having stools that are hard, dry, or larger than normal.    · Feeling full or bloated.    · Pain in the lower abdomen.    · Not feeling relief after having a bowel movement.    DIAGNOSIS   Your health care provider will take a medical history and perform a physical exam. Further testing may be done for severe constipation. Some tests may include:  · A barium enema X-ray to examine your rectum, colon, and, sometimes, your small intestine.    · A sigmoidoscopy to examine your lower colon.    · A colonoscopy to examine your entire colon.  TREATMENT   Treatment will depend on the severity of your constipation and what is causing it. Some dietary treatments include drinking more fluids and eating more fiber-rich foods. Lifestyle treatments may include regular exercise. If these diet and lifestyle recommendations do not help, your health care  provider may recommend taking over-the-counter laxative medicines to help you have bowel movements. Prescription medicines may be prescribed if over-the-counter medicines do not work.   HOME CARE INSTRUCTIONS   · Eat foods that have a lot of fiber, such as fruits, vegetables, whole grains, and beans.  · Limit foods high in fat and processed sugars, such as french fries, hamburgers, cookies, candies, and soda.    · A fiber supplement may be added to your diet if you cannot get enough fiber from foods.    · Drink enough fluids to keep your urine clear or pale yellow.    · Exercise regularly or as directed by your health care provider.    · Go to the restroom when you have the urge to go. Do not hold it.    · Only take over-the-counter or prescription medicines as directed by your health care provider. Do not take other medicines for constipation without talking to your health care provider first.    SEEK IMMEDIATE MEDICAL CARE IF:   · You have bright red blood in your stool.    · Your constipation lasts for more than 4 days or gets worse.    · You have abdominal or rectal pain.    · You have thin, pencil-like stools.    · You have unexplained weight loss.  MAKE SURE YOU:   · Understand these instructions.  · Will watch your condition.  · Will get help right away if you are not   you have with your health care provider.   I think some ear discomfort is either coming from your diverticuli or possibly constipation. I have called in some mural ask for you to take daily, take this as we discussed in your office visit using your bowel movements as her guide. If you experience any of the symptoms we talked about increased fever,  increased abdominal pain, blood in your stool you need to come in or go to an urgent care and may lead to have your belly imaged. Drink plenty of water, try to drink at least 80 ounces of water a day. Eat a higher fiber diet trying to take at least 40 g of fiber and a day.

## 2014-11-28 ENCOUNTER — Encounter: Payer: Self-pay | Admitting: Family Medicine

## 2014-11-28 ENCOUNTER — Ambulatory Visit (INDEPENDENT_AMBULATORY_CARE_PROVIDER_SITE_OTHER): Payer: Managed Care, Other (non HMO) | Admitting: Family Medicine

## 2014-11-28 VITALS — Temp 97.9°F | Ht 62.0 in | Wt 191.8 lb

## 2014-11-28 DIAGNOSIS — M79602 Pain in left arm: Secondary | ICD-10-CM | POA: Diagnosis not present

## 2014-11-28 DIAGNOSIS — N644 Mastodynia: Secondary | ICD-10-CM | POA: Diagnosis not present

## 2014-11-28 HISTORY — DX: Pain in left arm: M79.602

## 2014-11-28 HISTORY — DX: Mastodynia: N64.4

## 2014-11-28 NOTE — Patient Instructions (Signed)
Please have the diagnostic mammogram of your left breast completed.

## 2014-11-28 NOTE — Assessment & Plan Note (Signed)
Sharp left breat pain 1-2 times per week. Associated clear nipple discharge 1-2 times per month. No abnormalities on exam. -suspect likely due to large breasts -diagnostic left mammogram to rule out mass

## 2014-11-28 NOTE — Assessment & Plan Note (Signed)
Left forearm burning sensation 1-2 times per day. Cervical and Neuro exam unremarkable. 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 -patient encouraged to change driving positions to see if she gets improvement of symptoms.

## 2014-11-28 NOTE — Progress Notes (Signed)
   Subjective:    Patient ID: Adrienne Collins, female    DOB: 1971-04-16, 44 y.o.   MRN: [id number]  HPI 44 y/o female presents for evaluation of left breast pain.   Patient has history of right breast mass (see objective section below). No further right breast pain.   11/02/14 - tetanus in the left arm, has left arm pain for weeks, developed left axillary pain the day after which lasted for 5 day, describes as a sore pain, pain is 5/10, reports occasional left arm/forearm burning sensation that lasts for a few seconds, no alleviating or exacerbating symtpoms  Sharp pain in left breast - has been present in the past few years, flairs occasionally, however has noticed increased pain in the left breast since receiving the tetanus shot, pain is two times per week, lasts for few seconds, no associated chest pain/sob/headache/cough, she has noted rare (1-2 times per month) clear nipple discharge of the left breast that has been intermittent over the past few years, does report breast tenderness with period (this pain is different).  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   Review of Systems See above    Objective:   Physical Exam Vitals: reviewed Gen: pleasant AAF, NAD Left breast - large, no skin changes, fibrocystic changes, no discrete mass, no tenderness, no nipple discharge, no left axillary lymphadenopathy MSK - no neck tenderness, cervical ROM is full Neuro: strength bilateral UE 5/5, sensation to light touch grossly intact, 5/5 grip strength bilatearally  01/2013 - right breast mass on bilateral screening mammogram, no focal mass on ultrasound 07/2013 - mammogram negative for mass of right breast 01/2014 - Mammogram and [place] of right breast negative for abnormality (undersent diagnostic evaluation due to nipple discharge).     Assessment & Plan:  Please see problem specific assessment and plan.

## 2014-12-21 ENCOUNTER — Other Ambulatory Visit: Payer: Self-pay | Admitting: Family Medicine

## 2015-01-18 ENCOUNTER — Other Ambulatory Visit: Payer: Self-pay | Admitting: Family Medicine

## 2015-01-23 ENCOUNTER — Other Ambulatory Visit: Payer: Self-pay | Admitting: Family Medicine

## 2015-02-06 ENCOUNTER — Telehealth: Payer: Self-pay | Admitting: Family Medicine

## 2015-02-06 NOTE — Telephone Encounter (Signed)
Placed in MDs box. Deseree Blount, CMA  

## 2015-02-06 NOTE — Telephone Encounter (Signed)
Patient dropped [place] Form to be completed and signed by PCP. Please, follow up with Patient.

## 2015-02-07 ENCOUNTER — Other Ambulatory Visit: Payer: Self-pay | Admitting: Family Medicine

## 2015-02-07 MED ORDER — METOPROLOL SUCCINATE ER 50 MG PO TB24: 50.0000 mg | ORAL_TABLET | Freq: Every day | ORAL | Status: DC

## 2015-02-07 MED ORDER — ENALAPRIL-HYDROCHLOROTHIAZIDE 5-12.5 MG PO TABS: 1.0000 | ORAL_TABLET | Freq: Every day | ORAL | Status: DC

## 2015-02-07 NOTE — Telephone Encounter (Signed)
Completed form and printed prescriptions for Toprol and Enalapril-HCTZ (90 day supply). Gave form to [place]. I have already called the patient to inform her that that paperwork has been completed.

## 2015-05-17 ENCOUNTER — Other Ambulatory Visit (HOSPITAL_COMMUNITY)
Admission: RE | Admit: 2015-05-17 | Discharge: 2015-05-17 | Disposition: A | Discharge status: Home / Self Care | Payer: Managed Care, Other (non HMO) | Source: Ambulatory Visit | Attending: Family Medicine | Admitting: Family Medicine

## 2015-05-17 ENCOUNTER — Ambulatory Visit (INDEPENDENT_AMBULATORY_CARE_PROVIDER_SITE_OTHER): Payer: Managed Care, Other (non HMO) | Admitting: Internal Medicine

## 2015-05-17 ENCOUNTER — Encounter: Payer: Self-pay | Admitting: Internal Medicine

## 2015-05-17 VITALS — BP 114/63 | HR 76 | Temp 98.5°F | Ht 62.0 in | Wt 200.0 lb

## 2015-05-17 DIAGNOSIS — Z124 Encounter for screening for malignant neoplasm of cervix: Secondary | ICD-10-CM

## 2015-05-17 DIAGNOSIS — D509 Iron deficiency anemia, unspecified: Secondary | ICD-10-CM | POA: Diagnosis not present

## 2015-05-17 DIAGNOSIS — Z1151 Encounter for screening for human papillomavirus (HPV): Secondary | ICD-10-CM | POA: Diagnosis not present

## 2015-05-17 DIAGNOSIS — Z01419 Encounter for gynecological examination (general) (routine) without abnormal findings: Secondary | ICD-10-CM | POA: Insufficient documentation

## 2015-05-17 LAB — CBC
HEMATOCRIT: 31.2 % — AB (ref 36.0–46.0)
HEMOGLOBIN: 10.2 g/dL — AB (ref 12.0–15.0)
MCH: 25.8 pg — AB (ref 26.0–34.0)
MCHC: 32.7 g/dL (ref 30.0–36.0)
MCV: 78.8 fL (ref 78.0–100.0)
MPV: 10.3 fL (ref 8.6–12.4)
Platelets: 311 10*3/uL (ref 150–400)
RBC: 3.96 MIL/uL (ref 3.87–5.11)
RDW: 15.2 % (ref 11.5–15.5)
WBC: 7.8 10*3/uL (ref 4.0–10.5)

## 2015-05-17 NOTE — Assessment & Plan Note (Signed)
-   Will recheck CBC today as hbg has been low since last check in February  - Patient not consistently compliant with iron, she states she will try to do better with this  - Patient does have heavy periods, but not interested in any type of birth control to regulate her periods at this point

## 2015-05-17 NOTE — Progress Notes (Signed)
Patient ID: Adrienne Collins, female   DOB: February 12, 1971, 44 y.o.   MRN: [id number]    [facility] Kerrin Mo, MD Phone: [phone]  Subjective:   # Patient is doing well. Has no complaints. No medication refills needed.   # Women's Health visit: Not sexually active. Does not use birth control and does not want any birth control. She state that she has regular periods. However her periods are heavy. Patient does have a history of Anemia   All relevant systems were reviewed and were negative unless otherwise noted in the HPI  Past Medical History Reviewed problem list.  Medications- reviewed and updated Current Outpatient Prescriptions  Medication Sig Dispense Refill  . acetaminophen (TYLENOL) 650 MG CR tablet Take 2 tablets (1,300 mg total) by mouth every 8 (eight) hours as needed for pain. take 2 tabs every 6 hours as needed for pain 30 tablet prn  . Enalapril-Hydrochlorothiazide 5-12.5 MG per tablet Take 1 tablet by mouth daily. 90 tablet 1  . ferrous sulfate 325 (65 FE) MG tablet Take 325 mg by mouth daily with breakfast.    . ibuprofen (ADVIL,MOTRIN) 200 MG tablet Take 800 mg by mouth every 6 (six) hours as needed for pain.    . metoprolol succinate (TOPROL-XL) 50 MG 24 hr tablet Take 1 tablet (50 mg total) by mouth daily. Take with or immediately following a meal. 90 tablet 1  . polyethylene glycol powder (GLYCOLAX/MIRALAX) powder Use 17 g twice a day for 2 days, then 17 g or less, daily until stools are soft but not loose. 3350 g 1   No current facility-administered medications for this visit.   Chief complaint-noted No additions to family history Social history- patient is a non smoker  Objective: BP 114/63 mmHg  Pulse 76  Temp(Src) 98.5 F (36.9 C) (Oral)  Ht 5\' 2"  (1.575 m)  Wt 200 lb (90.719 kg)  BMI 36.57 kg/m2  LMP 05/10/2015 Gen: NAD, alert, cooperative with exam HEENT: NCAT, EOMI, PERRL, TMs nml Neck: FROM, supple CV: RRR, good S1/S2, no  murmur, cap refill <3 Resp: CTABL, no wheezes, non-labored Abd: SNTND, BS present, no guarding or organomegaly Ext: No edema, warm, normal tone, moves UE/LE spontaneously Neuro: Alert and oriented, No gross deficits Skin: no rashes no lesions Vaginal Exam: Cervix clearly visualized, appears smooth with no visible lesions, erosions, or scars , external os normal, vaginal mucosa appears pink, and well rugated. Vagina non?tender and without masses/nodularity), Bilateral adnexae without masses or tendernes  Assessment/Plan: See problem based a/p Anemia, iron deficiency - Will recheck CBC today as hbg has been low since last check in February  - Patient not consistently compliant with iron, she states she will try to do better with this  - Patient does have heavy periods, but not interested in any type of birth control to regulate her periods at this point

## 2015-05-17 NOTE — Patient Instructions (Addendum)
See you back in 3 months or as needed

## 2015-05-19 LAB — CYTOLOGY - PAP

## 2015-05-27 ENCOUNTER — Encounter: Payer: Self-pay | Admitting: Internal Medicine

## 2015-06-02 ENCOUNTER — Ambulatory Visit (INDEPENDENT_AMBULATORY_CARE_PROVIDER_SITE_OTHER): Payer: Managed Care, Other (non HMO) | Admitting: Family Medicine

## 2015-06-02 ENCOUNTER — Encounter: Payer: Self-pay | Admitting: Family Medicine

## 2015-06-02 VITALS — BP 119/62 | HR 59 | Temp 98.2°F | Ht 62.0 in | Wt 198.0 lb

## 2015-06-02 DIAGNOSIS — R5383 Other fatigue: Secondary | ICD-10-CM | POA: Insufficient documentation

## 2015-06-02 DIAGNOSIS — R5382 Chronic fatigue, unspecified: Secondary | ICD-10-CM | POA: Diagnosis not present

## 2015-06-02 DIAGNOSIS — B349 Viral infection, unspecified: Secondary | ICD-10-CM | POA: Insufficient documentation

## 2015-06-02 HISTORY — DX: Other fatigue: R53.83

## 2015-06-02 LAB — TSH: TSH: 1.156 u[IU]/mL (ref 0.350–4.500)

## 2015-06-02 MED ORDER — DM-GUAIFENESIN ER 30-600 MG PO TB12: 1.0000 | ORAL_TABLET | Freq: Two times a day (BID) | ORAL | Status: DC

## 2015-06-02 NOTE — Assessment & Plan Note (Signed)
Patient has signs and symptoms consistent with viral illness -Conservative measures discussed such as saltwater gargles and Mucinex

## 2015-06-02 NOTE — Assessment & Plan Note (Signed)
Patient presents for evaluation of fatigue that has been present for 6 months. Clinically I suspect OSA. -Recent hemoglobin stable which rules out anemia -Check TSH to evaluate for thyroid etiology -No depressive symptoms today -Patient declines sleep study however would like family member to observe her while sleeping to see if she does have apneic spells, patient to call the office if she decides to have sleep study

## 2015-06-02 NOTE — Patient Instructions (Signed)
It was nice to see you today.  Fatigue - please have your family member watch you sleep to see if you 1. Snore and 2. Stop breathing.   Sore throat/mucous - mucinex and salt water gargle

## 2015-06-02 NOTE — Progress Notes (Signed)
   Subjective:    Patient ID: Adrienne Collins, female    DOB: 12-Jan-1971, 44 y.o.   MRN: [id number]  HPI 44 year old female presents for evaluation of fatigue.  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  Social-works as bus Geophysicist/field seismologist, nonsmoker  Review of Systems  Constitutional: Positive for fatigue. Negative for fever and diaphoresis.  HENT: Positive for postnasal drip, rhinorrhea and sore throat. Negative for ear discharge, ear pain and sneezing.   Respiratory: Negative for apnea and shortness of breath.   Cardiovascular: Negative for chest pain and leg swelling.  Gastrointestinal: Negative for nausea, vomiting and diarrhea.       Objective:   Physical Exam Vitals: Reviewed Gen.: Pleasant female, no acute distress HEENT: Normocephalic, pupils equal round and reactive to light, extraocular movements are intact, no rhinorrhea, nasal septum midline, moist because murmurs, no pharyngeal erythema or exudate noted, bilateral TMs are pearly-gray without erythema or bulging, neck was supple, no anterior posterior cervical lymphadenopathy Cardiac: Regular rate and rhythm, S1 and S2 present, no murmurs, no heaves or thrills Respiratory: Clear to station bilaterally, normal effort  PHQ office or of 1, patient denies depressive symptoms     Assessment &  Plan:  Fatigue Patient presents for evaluation of fatigue that has been present for 6 months. Clinically I suspect OSA. -Recent hemoglobin stable which rules out anemia -Check TSH to evaluate for thyroid etiology -No depressive symptoms today -Patient declines sleep study however would like family member to observe her while sleeping to see if she does have apneic spells, patient to call the office if she decides to have sleep study  Viral illness Patient has signs and symptoms consistent with viral illness -Conservative measures discussed such as saltwater gargles and Mucinex

## 2015-06-05 ENCOUNTER — Encounter: Payer: Self-pay | Admitting: Family Medicine

## 2015-07-24 ENCOUNTER — Telehealth: Payer: Self-pay | Admitting: Family Medicine

## 2015-07-24 DIAGNOSIS — R5382 Chronic fatigue, unspecified: Secondary | ICD-10-CM

## 2015-07-24 NOTE — Telephone Encounter (Signed)
Wanted to get set up with sleep study

## 2015-07-24 NOTE — Telephone Encounter (Signed)
RN staff - I have ordered a sleep study for this patient, please call her and let her know that someone from the sleep lab will contact her to schedule.

## 2015-07-24 NOTE — Telephone Encounter (Signed)
Left message on patient voicemail, informed that someone from the [facility] to set up appointment.

## 2015-07-27 ENCOUNTER — Other Ambulatory Visit: Payer: Self-pay | Admitting: Family Medicine

## 2015-08-03 ENCOUNTER — Other Ambulatory Visit: Payer: Self-pay | Admitting: Family Medicine

## 2015-08-03 DIAGNOSIS — R5382 Chronic fatigue, unspecified: Secondary | ICD-10-CM

## 2015-08-03 NOTE — Progress Notes (Signed)
Received notification from [facility] sleep study clinic. [employer] declined formal PSG but approved home sleep study.

## 2015-08-08 ENCOUNTER — Ambulatory Visit (HOSPITAL_BASED_OUTPATIENT_CLINIC_OR_DEPARTMENT_OTHER): Payer: Managed Care, Other (non HMO) | Attending: Family Medicine | Admitting: Radiology

## 2015-08-08 VITALS — Ht 62.0 in | Wt 199.0 lb

## 2015-08-08 DIAGNOSIS — G4719 Other hypersomnia: Secondary | ICD-10-CM | POA: Insufficient documentation

## 2015-08-08 DIAGNOSIS — R5383 Other fatigue: Secondary | ICD-10-CM | POA: Diagnosis present

## 2015-08-08 DIAGNOSIS — G4733 Obstructive sleep apnea (adult) (pediatric): Secondary | ICD-10-CM | POA: Diagnosis not present

## 2015-08-08 DIAGNOSIS — R5382 Chronic fatigue, unspecified: Secondary | ICD-10-CM

## 2015-08-12 DIAGNOSIS — G4733 Obstructive sleep apnea (adult) (pediatric): Secondary | ICD-10-CM | POA: Diagnosis not present

## 2015-08-12 NOTE — Progress Notes (Signed)
   Patient Name: Adrienne Collins, Adrienne Collins Date: 08/08/2015 Gender: Female D.O.B: [birth year] Age (years): 44 Referring Provider: Dossie Arbour Height (inches): 31 Interpreting Physician: Baird Lyons MD, ABSM Weight (lbs): 199 RPSGT: Jacolyn Reedy BMI: 36 MRN: [id number] Neck Size: 14.00  CLINICAL INFORMATION Sleep Study Type: HST Indication for sleep study: Fatigue Epworth Sleepiness Score: 8  SLEEP STUDY TECHNIQUE A multi-channel overnight portable sleep study was performed. The channels recorded were: nasal airflow, thoracic respiratory movement, and oxygen saturation with a pulse oximetry. Snoring was also monitored.  MEDICATIONS Patient self administered medications include: med list charted.  SLEEP ARCHITECTURE Patient was studied for 358.7 minutes. The sleep efficiency was 100.0 % and the patient was supine for 0%. The arousal index was 0.0 per hour.  RESPIRATORY PARAMETERS The overall AHI was 5.7 per hour, with a central apnea index of 0.3 per hour. The oxygen nadir was 89% during sleep.  CARDIAC DATA Mean heart rate during sleep was 64.5 bpm.  IMPRESSIONS - Mild obstructive sleep apnea occurred during this study (AHI = 5.7/h). - No significant central sleep apnea occurred during this study (CAI = 0.3/h). - Mild oxygen desaturation was noted during this study (Min O2 = 89%). - Patient snored during 21.3% of sleep time.  DIAGNOSIS - Obstructive Sleep Apnea (327.23 [G47.33 ICD-10])  RECOMMENDATIONS - There is minimal obstructive sleep apnea. Conservative measures including weight loss and more regular physical activity are advised. Better sleep habits, such as not "lying around watching TV" should help daytime alertness.  - Oral appliance may be considered. - Avoid alcohol, sedatives and other CNS depressants that may worsen sleep apnea and disrupt normal sleep architecture. - Sleep hygiene should be reviewed to assess factors that may improve sleep quality. -  Weight management and regular exercise should be initiated or continued.  Deneise Lever Diplomate, American Board of Sleep Medicine  ELECTRONICALLY SIGNED ON:  08/12/2015, 8:36 AM [place] PH: [phone]   FX: [phone] [place]

## 2015-08-25 ENCOUNTER — Ambulatory Visit: Payer: Managed Care, Other (non HMO) | Admitting: Family Medicine

## 2015-08-25 ENCOUNTER — Ambulatory Visit (INDEPENDENT_AMBULATORY_CARE_PROVIDER_SITE_OTHER): Payer: Managed Care, Other (non HMO) | Admitting: Family Medicine

## 2015-08-25 VITALS — BP 119/80 | HR 59 | Temp 98.8°F | Ht 62.0 in | Wt 201.7 lb

## 2015-08-25 DIAGNOSIS — R208 Other disturbances of skin sensation: Secondary | ICD-10-CM

## 2015-08-25 DIAGNOSIS — M542 Cervicalgia: Secondary | ICD-10-CM | POA: Diagnosis not present

## 2015-08-25 DIAGNOSIS — R200 Anesthesia of skin: Secondary | ICD-10-CM

## 2015-08-25 NOTE — Patient Instructions (Addendum)
Continue aleve Gentle stretching of neck Can try heating pad or ice  If not better in 1-2 weeks, or if worsening please call Adrienne Collins  Be well, Dr. Ardelia Mems  TREATMENT  Treatment initially involves the use of ice and medication to help reduce pain and inflammation. It is also important to perform strengthening and stretching exercises and modify activities that worsen symptoms so the injury does not get worse. These exercises may be performed at home or with a therapist. For patients who experience severe symptoms, a soft, padded collar may be recommended to be worn around the neck.  Improving your posture may help reduce symptoms. Posture improvement includes pulling your chin and abdomen in while sitting or standing. If you are sitting, sit in a firm chair with your buttocks against the back of the chair. While sleeping, try replacing your pillow with a small towel rolled to 2 inches in diameter, or use a cervical pillow or soft cervical collar. Poor sleeping positions delay healing.  For patients with nerve root damage, which causes numbness or weakness, the use of a cervical traction apparatus may be recommended. Surgery is rarely necessary for these injuries. However, cervical strain and sprains that are present at birth (congenital) may require surgery. MEDICATION   If pain medication is necessary, nonsteroidal anti-inflammatory medications, such as aspirin and ibuprofen, or other minor pain relievers, such as acetaminophen, are often recommended.  Do not take pain medication for 7 days before surgery.  Prescription pain relievers may be given if deemed necessary by your caregiver. Use only as directed and only as much as you need. HEAT AND COLD:   Cold treatment (icing) relieves pain and reduces inflammation. Cold treatment should be applied for 10 to 15 minutes every 2 to 3 hours for inflammation and pain and immediately after any activity that aggravates your symptoms. Use ice packs or an  ice massage.  Heat treatment may be used prior to performing the stretching and strengthening activities prescribed by your caregiver, physical therapist, or athletic trainer. Use a heat pack or a warm soak. SEEK MEDICAL CARE IF:   Symptoms get worse or do not improve in 2 weeks despite treatment.  New, unexplained symptoms develop (drugs used in treatment may produce side effects). EXERCISES RANGE OF MOTION (ROM) AND STRETCHING EXERCISES - Cervical Strain and Sprain These exercises may help you when beginning to rehabilitate your injury. In order to successfully resolve your symptoms, you must improve your posture. These exercises are designed to help reduce the forward-head and rounded-shoulder posture which contributes to this condition. Your symptoms may resolve with or without further involvement from your physician, physical therapist or athletic trainer. While completing these exercises, remember:   Restoring tissue flexibility helps normal motion to return to the joints. This allows healthier, less painful movement and activity.  An effective stretch should be held for at least 20 seconds, although you may need to begin with shorter hold times for comfort.  A stretch should never be painful. You should only feel a gentle lengthening or release in the stretched tissue. STRETCH- Axial Extensors  Lie on your back on the floor. You may bend your knees for comfort. Place a rolled-up hand towel or dish towel, about 2 inches in diameter, under the part of your head that makes contact with the floor.  Gently tuck your chin, as if trying to make a "double chin," until you feel a gentle stretch at the base of your head.  Hold __________ seconds. Repeat __________  times. Complete this exercise __________ times per day.  STRETCH - Axial Extension   Stand or sit on a firm surface. Assume a good posture: chest up, shoulders drawn back, abdominal muscles slightly tense, knees unlocked (if  standing) and feet hip width apart.  Slowly retract your chin so your head slides back and your chin slightly lowers. Continue to look straight ahead.  You should feel a gentle stretch in the back of your head. Be certain not to feel an aggressive stretch since this can cause headaches later.  Hold for __________ seconds. Repeat __________ times. Complete this exercise __________ times per day. STRETCH - Cervical Side Bend   Stand or sit on a firm surface. Assume a good posture: chest up, shoulders drawn back, abdominal muscles slightly tense, knees unlocked (if standing) and feet hip width apart.  Without letting your nose or shoulders move, slowly tip your right / left ear to your shoulder until your feel a gentle stretch in the muscles on the opposite side of your neck.  Hold __________ seconds. Repeat __________ times. Complete this exercise __________ times per day. STRETCH - Cervical Rotators   Stand or sit on a firm surface. Assume a good posture: chest up, shoulders drawn back, abdominal muscles slightly tense, knees unlocked (if standing) and feet hip width apart.  Keeping your eyes level with the ground, slowly turn your head until you feel a gentle stretch along the back and opposite side of your neck.  Hold __________ seconds. Repeat __________ times. Complete this exercise __________ times per day. RANGE OF MOTION - Neck Circles   Stand or sit on a firm surface. Assume a good posture: chest up, shoulders drawn back, abdominal muscles slightly tense, knees unlocked (if standing) and feet hip width apart.  Gently roll your head down and around from the back of one shoulder to the back of the other. The motion should never be forced or painful.  Repeat the motion 10-20 times, or until you feel the neck muscles relax and loosen. Repeat __________ times. Complete the exercise __________ times per day. STRENGTHENING EXERCISES - Cervical Strain and Sprain These exercises may  help you when beginning to rehabilitate your injury. They may resolve your symptoms with or without further involvement from your physician, physical therapist, or athletic trainer. While completing these exercises, remember:   Muscles can gain both the endurance and the strength needed for everyday activities through controlled exercises.  Complete these exercises as instructed by your physician, physical therapist, or athletic trainer. Progress the resistance and repetitions only as guided.  You may experience muscle soreness or fatigue, but the pain or discomfort you are trying to eliminate should never worsen during these exercises. If this pain does worsen, stop and make certain you are following the directions exactly. If the pain is still present after adjustments, discontinue the exercise until you can discuss the trouble with your clinician. STRENGTH - Cervical Flexors, Isometric  Face a wall, standing about 6 inches away. Place a small pillow, a ball about 6-8 inches in diameter, or a folded towel between your forehead and the wall.  Slightly tuck your chin and gently push your forehead into the soft object. Push only with mild to moderate intensity, building up tension gradually. Keep your jaw and forehead relaxed.  Hold 10 to 20 seconds. Keep your breathing relaxed.  Release the tension slowly. Relax your neck muscles completely before you start the next repetition. Repeat __________ times. Complete this exercise __________ times per  day. STRENGTH- Cervical Lateral Flexors, Isometric   Stand about 6 inches away from a wall. Place a small pillow, a ball about 6-8 inches in diameter, or a folded towel between the side of your head and the wall.  Slightly tuck your chin and gently tilt your head into the soft object. Push only with mild to moderate intensity, building up tension gradually. Keep your jaw and forehead relaxed.  Hold 10 to 20 seconds. Keep your breathing  relaxed.  Release the tension slowly. Relax your neck muscles completely before you start the next repetition. Repeat __________ times. Complete this exercise __________ times per day. STRENGTH - Cervical Extensors, Isometric   Stand about 6 inches away from a wall. Place a small pillow, a ball about 6-8 inches in diameter, or a folded towel between the back of your head and the wall.  Slightly tuck your chin and gently tilt your head back into the soft object. Push only with mild to moderate intensity, building up tension gradually. Keep your jaw and forehead relaxed.  Hold 10 to 20 seconds. Keep your breathing relaxed.  Release the tension slowly. Relax your neck muscles completely before you start the next repetition. Repeat __________ times. Complete this exercise __________ times per day. POSTURE AND BODY MECHANICS CONSIDERATIONS - Cervical Strain and Sprain Keeping correct posture when sitting, standing or completing your activities will reduce the stress put on different body tissues, allowing injured tissues a chance to heal and limiting painful experiences. The following are general guidelines for improved posture. Your physician or physical therapist will provide you with any instructions specific to your needs. While reading these guidelines, remember:  The exercises prescribed by your provider will help you have the flexibility and strength to maintain correct postures.  The correct posture provides the optimal environment for your joints to work. All of your joints have less wear and tear when properly supported by a spine with good posture. This means you will experience a healthier, less painful body.  Correct posture must be practiced with all of your activities, especially prolonged sitting and standing. Correct posture is as important when doing repetitive low-stress activities (typing) as it is when doing a single heavy-load activity (lifting). PROLONGED STANDING WHILE  SLIGHTLY LEANING FORWARD When completing a task that requires you to lean forward while standing in one place for a long time, place either foot up on a stationary 2- to 4-inch high object to help maintain the best posture. When both feet are on the ground, the low back tends to lose its slight inward curve. If this curve flattens (or becomes too large), then the back and your other joints will experience too much stress, fatigue more quickly, and can cause pain.  RESTING POSITIONS Consider which positions are most painful for you when choosing a resting position. If you have pain with flexion-based activities (sitting, bending, stooping, squatting), choose a position that allows you to rest in a less flexed posture. You would want to avoid curling into a fetal position on your side. If your pain worsens with extension-based activities (prolonged standing, working overhead), avoid resting in an extended position such as sleeping on your stomach. Most people will find more comfort when they rest with their spine in a more neutral position, neither too rounded nor too arched. Lying on a non-sagging bed on your side with a pillow between your knees, or on your back with a pillow under your knees will often provide some relief. Keep in mind, being  in any one position for a prolonged period of time, no matter how correct your posture, can still lead to stiffness. WALKING Walk with an upright posture. Your ears, shoulders, and hips should all line up. OFFICE WORK When working at a desk, create an environment that supports good, upright posture. Without extra support, muscles fatigue and lead to excessive strain on joints and other tissues. CHAIR:  A chair should be able to slide under your desk when your back makes contact with the back of the chair. This allows you to work closely.  The chair's height should allow your eyes to be level with the upper part of your monitor and your hands to be slightly lower  than your elbows.  Body position:  Your feet should make contact with the floor. If this is not possible, use a foot rest.  Keep your ears over your shoulders. This will reduce stress on your neck and low back.   This information is not intended to replace advice given to you by your health care provider. Make sure you discuss any questions you have with your health care provider.   Document Released: 07/29/2005 Document Revised: 08/19/2014 Document Reviewed: 11/10/2008 Elsevier Interactive Patient Education [employer].

## 2015-08-25 NOTE — Progress Notes (Signed)
Date of Visit: 08/25/2015   HPI:  Patient presents for a same day appointment to discuss neck pain.  Has had pain in her neck for a few days. Denies any injury. Denies having fever, saddle anesthesia, lower extremity weakness, or problems with stooling or urination. Pain radiates some down her shoulder and inter her arm but not below the elbow. Has tried aleve which helped some. Overall the pain is improved since it began.     Also notes that for the last few days she's woken up with her hands numb. Has a history of carpal tunnel syndrome in the past.   ROS: See HPI  [place]: history of hypertension, carpal tunnel syndrome, anemia, menorrhagia,   PHYSICAL EXAM: BP 119/80 mmHg  Pulse 59  Temp(Src) 98.8 F (37.1 C) (Oral)  Ht 5\' 2"  (1.575 m)  Wt 201 lb 11.2 oz (91.491 kg)  BMI 36.88 kg/m2  LMP 08/13/2015 (Approximate) Gen: NAD, pleasant, cooperative HEENT: normocephalic, atraumatic.  Musculoskeletal: Slightly decreased ROM of neck due to pain in musculature and soft tissues of neck. No bony tenderness over neck. Grip strength 5/5 bilaterally. Negative neers and empty can test. Mild tenderness over shoulder musculature but full strength in shoulder. Negative tinel and phalen   ASSESSMENT/PLAN:  1. Neck pain - likely mild strain of neck soft tissues brought on by sleep positioning. Will treat conservatively with NSAIDs, heat/ice, gentle stretching. Handout given. Instructed to follow up in 1-2 weeks if not improving.   2. Hands numb upon waking - likely carpal tunnel. As only gone on for 3 days, recommend conservative treatment with nsaid's as above. Follow up as needed.  FOLLOW UP: Follow up as needed if symptoms worsen or fail to improve.    [place]. Ardelia Mems, [place]

## 2015-09-26 ENCOUNTER — Encounter (HOSPITAL_BASED_OUTPATIENT_CLINIC_OR_DEPARTMENT_OTHER): Payer: Managed Care, Other (non HMO)

## 2016-01-11 ENCOUNTER — Encounter: Payer: Self-pay | Admitting: Internal Medicine

## 2016-01-11 ENCOUNTER — Ambulatory Visit (INDEPENDENT_AMBULATORY_CARE_PROVIDER_SITE_OTHER): Payer: Managed Care, Other (non HMO) | Admitting: Internal Medicine

## 2016-01-11 VITALS — BP 115/64 | HR 62 | Temp 98.0°F | Ht 62.0 in | Wt 206.0 lb

## 2016-01-11 DIAGNOSIS — M25512 Pain in left shoulder: Secondary | ICD-10-CM | POA: Diagnosis not present

## 2016-01-11 DIAGNOSIS — G8929 Other chronic pain: Secondary | ICD-10-CM | POA: Insufficient documentation

## 2016-01-11 DIAGNOSIS — M25551 Pain in right hip: Secondary | ICD-10-CM

## 2016-01-11 HISTORY — DX: Pain in left shoulder: M25.512

## 2016-01-11 HISTORY — DX: Other chronic pain: G89.29

## 2016-01-11 MED ORDER — DICLOFENAC SODIUM 1 % TD GEL: 2.0000 g | Freq: Four times a day (QID) | TRANSDERMAL | Status: DC

## 2016-01-11 MED ORDER — NAPROXEN 500 MG PO TABS: 500.0000 mg | ORAL_TABLET | Freq: Two times a day (BID) | ORAL | Status: DC

## 2016-01-11 NOTE — Progress Notes (Signed)
   Subjective:    Patient ID: Adrienne Collins, female    DOB: 08-16-1970, 45 y.o.   MRN: [id number]  HPI  Patient presents for hip and shoulder pain.   L shoulder pain Acute onset two weeks ago. Patient drives a schoolbus for a living, and thinks the pain may be due to the repetitive arm movements she makes while driving. Advil "takes the edge off." Yesterday had pain when lifting something, which is why she scheduled this appointment. Denies weakness, numbness, or tingling. Has pain when laying on the affected arm.   R hip pain Ongoing for at least six months. No trauma prior to pain onset. Advil and Tylenol help some. Has tried generic muscle rubs with no symptomatic relief. Worse with walking, improves with laying down. Denies weakness, difficulty walking, numbness, or tingling.   Review of Systems See HPI.     Objective:   Physical Exam  Constitutional: She is oriented to person, place, and time. She appears well-developed and well-nourished. No distress.  HENT:  Head: Normocephalic and atraumatic.  Pulmonary/Chest: Effort normal.  Musculoskeletal:  Neer's negative. Empty can negative. Drop test negative. Full ROM. No bony abnormalities of shoulder. No erythema or swelling. TTP of L shoulder. No TTP of R hip.   Neurological: She is alert and oriented to person, place, and time.  Psychiatric: She has a normal mood and affect. Her behavior is normal.      Assessment & Plan:  Left shoulder pain Began acutely two weeks ago. 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  - Naprosyn 500mg  BID with food - Voltaren gel 2g up to QID  - Heating pad or warm compresses - Provided list of shoulder exercises  Chronic right hip pain Ongoing for 6 months. No neurological symptoms. Patient with BMI of 37, placing osteoarthritis high on differential.  - Naprosyn 500mg  BID  - Counseled patient on importance of weight loss  - Referral to physical therapy   Adin Hector, MD PGY-1 [facility] Pager [phone]

## 2016-01-11 NOTE — Patient Instructions (Addendum)
It was nice meeting you Adrienne Collins!  For your shoulder pain, you can apply Voltaren gel to the area up to four times a day. It will also be helpful to perform the included exercises twice a day.  For both your hip and shoulder pain, please take Naprosyn 500mg  twice a day with food. You can also use a warm compress or heating pad to help with the pain.   I have also placed a referral to physical therapy. You will be contacted with the date and time of this appointment.   If your shoulder pain worsens or has not improved in two weeks, please call to schedule another appointment. If you start to have severe hip pain, or experience numbness, weakness, or tingling in your hip or leg, please call to schedule an appointment.   If you have any questions or concerns, feel free to call the office.   Be well,  Dr. Avon Gully  Shoulder Range of Motion Exercises Shoulder range of motion (ROM) exercises are designed to keep the shoulder moving freely. They are often recommended for people who have shoulder pain. MOVEMENT EXERCISE When you are able, do this exercise 5-6 days per week, or as told by your health care provider. Work toward doing 2 sets of 10 swings. Pendulum Exercise How To Do This Exercise Lying Down 1. Lie face-down on a bed with your abdomen close to the side of the bed. 2. Let your arm hang over the side of the bed. 3. Relax your shoulder, arm, and hand. 4. Slowly and gently swing your arm forward and back. Do not use your neck muscles to swing your arm. They should be relaxed. If you are struggling to swing your arm, have someone gently swing it for you. When you do this exercise for the first time, swing your arm at a 15 degree angle for 15 seconds, or swing your arm 10 times. As pain lessens over time, increase the angle of the swing to 30-45 degrees. 5. Repeat steps 1-4 with the other arm. How To Do This Exercise While Standing 1. Stand next to a sturdy chair or table and hold on to  it with your hand.  Bend forward at the waist.  Bend your knees slightly.  Relax your other arm and let it hang limp.  Relax the shoulder blade of the arm that is hanging and let it drop.  While keeping your shoulder relaxed, use body motion to swing your arm in small circles. The first time you do this exercise, swing your arm for about 30 seconds or 10 times. When you do it next time, swing your arm for a little longer.  Stand up tall and relax.  Repeat steps 1-7, this time changing the direction of the circles. 2. Repeat steps 1-8 with the other arm. STRETCHING EXERCISES Do these exercises 3-4 times per day on 5-6 days per week or as told by your health care provider. Work toward holding the stretch for 20 seconds. Stretching Exercise 1 1. Lift your arm straight out in front of you. 2. Bend your arm 90 degrees at the elbow (right angle) so your forearm goes across your body and looks like the letter "L." 3. Use your other arm to gently pull the elbow forward and across your body. 4. Repeat steps 1-3 with the other arm. Stretching Exercise 2 You will need a towel or rope for this exercise. 1. Bend one arm behind your back with the palm facing outward. 2. Hold  a towel with your other hand. 3. Reach the arm that holds the towel above your head, and bend that arm at the elbow. Your wrist should be behind your neck. 4. Use your free hand to grab the free end of the towel. 5. With the higher hand, gently pull the towel up behind you. 6. With the lower hand, pull the towel down behind you. 7. Repeat steps 1-6 with the other arm. STRENGTHENING EXERCISES Do each of these exercises at four different times of day (sessions) every day or as told by your health care provider. To begin with, repeat each exercise 5 times (repetitions). Work toward doing 3 sets of 12 repetitions or as told by your health care provider. Strengthening Exercise 1 You will need a light weight for this activity. As  you grow stronger, you may use a heavier weight. 1. Standing with a weight in your hand, lift your arm straight out to the side until it is at the same height as your shoulder. 2. Bend your arm at 90 degrees so that your fingers are pointing to the ceiling. 3. Slowly raise your hand until your arm is straight up in the air. 4. Repeat steps 1-3 with the other arm. Strengthening Exercise 2 You will need a light weight for this activity. As you grow stronger, you may use a heavier weight. 1. Standing with a weight in your hand, gradually move your straight arm in an arc, starting at your side, then out in front of you, then straight up over your head. 2. Gradually move your other arm in an arc, starting at your side, then out in front of you, then straight up over your head. 3. Repeat steps 1-2 with the other arm. Strengthening Exercise 3 You will need an elastic band for this activity. As you grow stronger, gradually increase the size of the bands or increase the number of bands that you use at one time. 1. While standing, hold an elastic band in one hand and raise that arm up in the air. 2. With your other hand, pull down the band until that hand is by your side. 3. Repeat steps 1-2 with the other arm.   This information is not intended to replace advice given to you by your health care provider. Make sure you discuss any questions you have with your health care provider.   Document Released: 04/27/2003 Document Revised: 12/13/2014 Document Reviewed: 07/25/2014 Elsevier Interactive Patient Education [employer].

## 2016-01-11 NOTE — Assessment & Plan Note (Signed)
Began acutely two weeks ago. 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  - Naprosyn 500mg  BID with food - Voltaren gel 2g up to QID  - Heating pad or warm compresses - Provided list of shoulder exercises

## 2016-01-11 NOTE — Assessment & Plan Note (Signed)
Ongoing for 6 months. No neurological symptoms. Patient with BMI of 37, placing osteoarthritis high on differential.  - Naprosyn 500mg  BID  - Counseled patient on importance of weight loss  - Referral to physical therapy

## 2016-01-16 ENCOUNTER — Telehealth: Payer: Self-pay | Admitting: Family Medicine

## 2016-01-16 NOTE — Telephone Encounter (Signed)
[facility]:  Pts RX for diclofenac gel is not covered under pts insurance.  Could something else be substituted?

## 2016-01-17 NOTE — Telephone Encounter (Signed)
RN staff - please call patient and inform patient that there are not many options/replacements. She should attempt trial of over the counter Aspercreme.

## 2016-01-18 ENCOUNTER — Telehealth: Payer: Self-pay | Admitting: *Deleted

## 2016-01-18 NOTE — Telephone Encounter (Signed)
Patient informed, expressed understanding. 

## 2016-01-18 NOTE — Telephone Encounter (Signed)
Tom called from [facility] saying that he is going to cancel due to dr telling pt to use over the counter medicine. Deseree Kennon Holter, CMA

## 2016-01-23 ENCOUNTER — Other Ambulatory Visit: Payer: Self-pay | Admitting: Family Medicine

## 2016-03-20 ENCOUNTER — Encounter: Payer: Self-pay | Admitting: Family Medicine

## 2016-03-20 ENCOUNTER — Telehealth: Payer: Self-pay | Admitting: *Deleted

## 2016-03-20 DIAGNOSIS — G4733 Obstructive sleep apnea (adult) (pediatric): Secondary | ICD-10-CM

## 2016-03-20 HISTORY — DX: Obstructive sleep apnea (adult) (pediatric): G47.33

## 2016-03-20 NOTE — Telephone Encounter (Signed)
Yes I did

## 2016-03-20 NOTE — Telephone Encounter (Signed)
Called patient and discussed sleep study results with her. She had mild OSA that does not require CPAP at this time.  Jazmin - did you leave a copy of this up front?

## 2016-03-20 NOTE — Telephone Encounter (Signed)
Patient was notified that letter would be placed up front for pickup.

## 2016-03-20 NOTE — Telephone Encounter (Signed)
Patient states that she never heard back from the results of her sleep study.  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  Jazmin Hartsell,CMA

## 2016-04-22 ENCOUNTER — Other Ambulatory Visit: Payer: Self-pay | Admitting: Family Medicine

## 2016-04-22 MED ORDER — ENALAPRIL-HYDROCHLOROTHIAZIDE 5-12.5 MG PO TABS: 1.0000 | ORAL_TABLET | Freq: Every day | ORAL | 1 refills | Status: DC

## 2016-06-05 NOTE — Progress Notes (Signed)
45 y.o. year old female presents for well woman/preventative visit and annual GYN examination.  Acute Concerns: Palpitations: no chest pain, no sob, no radiation of pain, headaches; more frequent recently (worse in morning after caffeine); previously seen by cardiologist a few years ago (treadmill stress test negative at that time).  Left finger numbness - 3rd and 4th digits, previous diagnosis of carpal tunnel, did not pick up wrist splints  Diet: breakfast - fast food; lunch - chicken/fish + vegetables; dinner - meat + vegetables; lots of fried foods; drinks diet coat  Exercise: does not exercise regularly, plans to join a gym  Sexual/Birth History: celebate  Birth Control: Regular periods, occasionally heavy, has not been taking iron  POA/Living Will: does not have living will  Social:  Social History   Social History  . Marital status: Single    Spouse name: N/A  . Number of children: 2  . Years of education: 21 GED   Occupational History  . 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 Smoking status: Former Smoker    Quit date: 01/30/1994  . Smokeless tobacco: None  . Alcohol use No     Comment: quit 1995  . Drug use: No  . Sexual activity: Not Currently   Other Topics Concern  . None   Social History Narrative   Never married, celebate since 2000   Son, [place], odd jobs, living at home   Son, Regino Schultze, Ship broker at [employer], living at home   [place] GED since 01/2012, [place] [facility] [employer]   Employed at [employer]   Attends [employer]: Immunization History  Administered Date(s) Administered  . Influenza-Unspecified 05/12/2014  . Td 11/10/2004  . Tdap 10/03/2014  Due for flu vaccine  Cancer Screening:  Pap Smear: 05/2015 Negative Cytology and HPV  Mammogram: Last completed 11/30/14. Due for repeat.   Colonoscopy: No FH of colon cancer, had colonoscopy in 2010 which showed polyp (has  not seen GI since then)  Dexa: Not a candidate  EKG: sinus bradycardia, LVH  Physical Exam: VITALS: Reviewed GEN: Pleasant female, NAD, obese HEENT: Normocephalic, PERRL, EOMI, no scleral icterus, bilateral TM pearly grey, nasal septum midline, MMM, uvula midline, no anterior or posterior lymphadenopathy, no thyromegaly CARDIAC:RRR, S1 and S2 present, no murmur, no heaves/thrills RESP: CTAB, normal effort BREAST:Deferred as not breast issues ABD: soft, no tenderness, normal bowel sounds GU/GYN:Deferred EXT: No edema, 2+ radial and DP pulses SKIN: no rash MSK: positive Tinnel and Phalen on left  ASSESSMENT & PLAN: 45 y.o. female presents for annual well woman/preventative exam. Please see problem specific assessment and plan.   Preventative health care 45 y/o female presents for preventative visit. - flu shot provided (otherwise up to date on immunizations) - up to date on Pap Smear, Due for Mammogram (patient to schedule) - Given history of colon polyps will refer back to GI - discussed lifestyle modifications  History of colonic polyps Referred back to GI as previous polyps in 2010.   Morbid obesity ([place]) BMI 37 -discussed lifestyle modifications including benefit of exercise and portion control  LVH (left ventricular hypertrophy) LVH on EKG. No signs/sympoms of CHF.  -monitor clinically  PALPITATIONS, RECURRENT Intermittent palpitations. EKG showed sinus bradycardia. -continue BB, may need to decrease dose if patient becomes symptomatic from bradycardia -encouraged decreased caffeine intake  Carpal tunnel syndrome Patient has recurrent symptoms. Has  not worn cock up splints. -prescription for cock up splints provided

## 2016-06-07 ENCOUNTER — Ambulatory Visit (INDEPENDENT_AMBULATORY_CARE_PROVIDER_SITE_OTHER): Payer: Managed Care, Other (non HMO) | Admitting: Family Medicine

## 2016-06-07 ENCOUNTER — Ambulatory Visit (HOSPITAL_COMMUNITY)
Admission: RE | Admit: 2016-06-07 | Discharge: 2016-06-07 | Disposition: A | Discharge status: Home / Self Care | Payer: Managed Care, Other (non HMO) | Source: Ambulatory Visit | Attending: Family Medicine | Admitting: Family Medicine

## 2016-06-07 ENCOUNTER — Encounter: Payer: Self-pay | Admitting: Family Medicine

## 2016-06-07 DIAGNOSIS — Z6841 Body Mass Index (BMI) 40.0 and over, adult: Secondary | ICD-10-CM

## 2016-06-07 DIAGNOSIS — E6609 Other obesity due to excess calories: Secondary | ICD-10-CM

## 2016-06-07 DIAGNOSIS — Z0000 Encounter for general adult medical examination without abnormal findings: Secondary | ICD-10-CM

## 2016-06-07 DIAGNOSIS — R002 Palpitations: Secondary | ICD-10-CM | POA: Diagnosis not present

## 2016-06-07 DIAGNOSIS — I517 Cardiomegaly: Secondary | ICD-10-CM

## 2016-06-07 DIAGNOSIS — Z860100 Personal history of colon polyps, unspecified: Secondary | ICD-10-CM

## 2016-06-07 DIAGNOSIS — IMO0001 Reserved for inherently not codable concepts without codable children: Secondary | ICD-10-CM

## 2016-06-07 DIAGNOSIS — G5602 Carpal tunnel syndrome, left upper limb: Secondary | ICD-10-CM | POA: Diagnosis not present

## 2016-06-07 HISTORY — DX: Personal history of colonic polyps: Z86.010

## 2016-06-07 HISTORY — DX: Cardiomegaly: I51.7

## 2016-06-07 HISTORY — DX: Personal history of colon polyps, unspecified: Z86.0100

## 2016-06-07 LAB — TSH: TSH: 0.64 mIU/L

## 2016-06-07 LAB — CBC
HCT: 33.9 % — ABNORMAL LOW (ref 35.0–45.0)
Hemoglobin: 10 g/dL — ABNORMAL LOW (ref 11.7–15.5)
MCH: 23.8 pg — ABNORMAL LOW (ref 27.0–33.0)
MCHC: 29.5 g/dL — AB (ref 32.0–36.0)
MCV: 80.5 fL (ref 80.0–100.0)
MPV: 10.6 fL (ref 7.5–12.5)
Platelets: 360 10*3/uL (ref 140–400)
RBC: 4.21 MIL/uL (ref 3.80–5.10)
RDW: 16.6 % — AB (ref 11.0–15.0)
WBC: 5.3 10*3/uL (ref 3.8–10.8)

## 2016-06-07 NOTE — Assessment & Plan Note (Signed)
Intermittent palpitations. EKG showed sinus bradycardia. -continue BB, may need to decrease dose if patient becomes symptomatic from bradycardia -encouraged decreased caffeine intake

## 2016-06-07 NOTE — Assessment & Plan Note (Signed)
45 y/o female presents for preventative visit. - flu shot provided (otherwise up to date on immunizations) - up to date on Pap Smear, Due for Mammogram (patient to schedule) - Given history of colon polyps will refer back to GI - discussed lifestyle modifications

## 2016-06-07 NOTE — Patient Instructions (Addendum)
It was nice to see you today.  Dr. Ree Kida will call you with the lab results.   My office will call you to scheduled with GI.   Please call to schedule your mammogram.

## 2016-06-07 NOTE — Assessment & Plan Note (Signed)
LVH on EKG. No signs/sympoms of CHF.  -monitor clinically

## 2016-06-07 NOTE — Assessment & Plan Note (Signed)
BMI 37 -discussed lifestyle modifications including benefit of exercise and portion control

## 2016-06-07 NOTE — Assessment & Plan Note (Signed)
Patient has recurrent symptoms. Has not worn cock up splints. -prescription for cock up splints provided

## 2016-06-07 NOTE — Assessment & Plan Note (Signed)
Referred back to GI as previous polyps in 2010.

## 2016-06-08 LAB — COMPLETE METABOLIC PANEL WITH GFR
ALBUMIN: 4.3 g/dL (ref 3.6–5.1)
ALK PHOS: 76 U/L (ref 33–115)
ALT: 16 U/L (ref 6–29)
AST: 21 U/L (ref 10–30)
BILIRUBIN TOTAL: 0.3 mg/dL (ref 0.2–1.2)
BUN: 9 mg/dL (ref 7–25)
CALCIUM: 9.7 mg/dL (ref 8.6–10.2)
CO2: 23 mmol/L (ref 20–31)
CREATININE: 0.69 mg/dL (ref 0.50–1.10)
Chloride: 103 mmol/L (ref 98–110)
GFR, Est Non African American: >89 mL/min (ref >=60)
Glucose, Bld: 87 mg/dL (ref 65–99)
Potassium: 4.4 mmol/L (ref 3.5–5.3)
Sodium: 141 mmol/L (ref 135–146)
TOTAL PROTEIN: 7.5 g/dL (ref 6.1–8.1)

## 2016-06-08 LAB — LIPID PANEL
CHOLESTEROL: 170 mg/dL (ref 125–200)
HDL: 44 mg/dL — ABNORMAL LOW (ref >=46)
LDL Cholesterol: 102 mg/dL (ref <130)
TRIGLYCERIDES: 122 mg/dL (ref <150)
Total CHOL/HDL Ratio: 3.9 Ratio (ref <=5.0)
VLDL: 24 mg/dL (ref <30)

## 2016-06-08 LAB — HIV ANTIBODY (ROUTINE TESTING W REFLEX): HIV: NONREACTIVE

## 2016-06-10 ENCOUNTER — Telehealth: Payer: Self-pay | Admitting: Gastroenterology

## 2016-06-10 ENCOUNTER — Encounter: Payer: Self-pay | Admitting: Family Medicine

## 2016-06-10 ENCOUNTER — Telehealth: Payer: Self-pay | Admitting: Family Medicine

## 2016-06-10 DIAGNOSIS — Z86010 Personal history of colonic polyps: Secondary | ICD-10-CM

## 2016-06-10 NOTE — Telephone Encounter (Signed)
Patient has not been seen since 10/2008, Dr. Deatra Ina. Forwarding this to Doc of the day to review chart.

## 2016-06-10 NOTE — Assessment & Plan Note (Signed)
Patient received call from GI stating that she was not due for colonoscopy until 2025. Per records patient has colonoscopy in 2010 for GI bleeding. She has two polyps in colon, pathology showed inflammatory polyp without malignant changes. I informed patient that she should likely have repeat colonoscopy in 2020 per the 2010 multi-society task force on colorectal cancer.

## 2016-06-10 NOTE — Telephone Encounter (Signed)
Called patient to discuss lab results. HIV negative. Cholesterol well controlled (ASCVD 2.4%). Hemoglobin slightly low at 10.0. Not taking iron pills as prescribed, Still has monthly cycle, no blood in stool.   Patient received call from GI stating that she was not due for colonoscopy until 2025. Per records patient has colonoscopy in 2010 for GI bleeding. She has two polyps in colon, pathology showed inflammatory polyp without malignant changes. I informed patient that she should likely have repeat colonoscopy in 2020 per the 2010 multi-society task force on colorectal cancer.

## 2016-06-11 NOTE — Telephone Encounter (Signed)
Spoke to patient and she is fine with recall date, not having any GI sxs. Explained that it should be 2023 when she is 45 years old. Changed recall to new date.

## 2016-06-11 NOTE — Telephone Encounter (Signed)
The polyp in 2010 was an inflammatory polyp--not neoplastic and so she is still considered to be at routine risk for colon cancer.  Recall should be when she turns 51 (08/2021) unless she has concerning GI symptoms.

## 2016-06-13 ENCOUNTER — Encounter: Payer: Self-pay | Admitting: Family Medicine

## 2016-07-11 ENCOUNTER — Encounter: Payer: Self-pay | Admitting: Family Medicine

## 2016-07-21 ENCOUNTER — Other Ambulatory Visit: Payer: Self-pay | Admitting: Family Medicine

## 2016-10-19 ENCOUNTER — Other Ambulatory Visit: Payer: Self-pay | Admitting: Family Medicine

## 2016-12-18 NOTE — Progress Notes (Deleted)
   Subjective:    Patient ID: Adrienne Collins, female    DOB: 25-Sep-1970, 46 y.o.   MRN: [id number]  HPI 46 y/o female presents for evaluation of stomach pain.  Stomach Pain History of constipation, menorrhagia, and colonic polyps.   HM Up to date other than could consider repeat mammogram (last in 12/2015)   Review of Systems     Objective:   Physical Exam There were no vitals taken for this visit.   Colonoscopy 2010 for GI bleed - 2 polyps     Assessment & Plan:  No problem-specific Assessment & Plan notes found for this encounter.

## 2016-12-20 ENCOUNTER — Ambulatory Visit: Payer: Managed Care, Other (non HMO) | Admitting: Family Medicine

## 2016-12-25 NOTE — Progress Notes (Deleted)
   Subjective:    Patient ID: Adrienne Collins, female    DOB: 1971-07-04, 46 y.o.   MRN: [id number]  HPI 46 y/o female presents for evaluation of abdominal pain.   GI/GYN PMH includes constipation and menorrhagia   Review of Systems     Objective:   Physical Exam There were no vitals taken for this visit.        Assessment & Plan:  No problem-specific Assessment & Plan notes found for this encounter.

## 2016-12-27 ENCOUNTER — Ambulatory Visit: Payer: Managed Care, Other (non HMO) | Admitting: Family Medicine

## 2017-01-08 NOTE — Progress Notes (Signed)
   Subjective:    Patient ID: Adrienne Collins, female    DOB: 1971-03-28, 46 y.o.   MRN: [id number]  HPI 46 y/o female presents for evaluation of stomach pain.  Stomach Pain History of constipation, menorrhagia, and colonic polyps. Patient thinks may be IBS (previously diagnosed). Previously prescribed Miralax but does not take.  Present for 2 weeks, occurs intermittently throughout the day, pain is 4-5/10, aching sides/diffuse pain/mentsral cramping, worse with sods/carbonated beverages, improves with BM, taking NSAIDS's with no improvement, daily constipation (small stools, hard), no melena, no emesis, no nausea No vaginal irritation/discharge, LMP May 5, Periods last 7 day, to bleeding between periods, +cramping that is improved with NSAID's, Patient is absinent  Knee Pain Patient [facility] Xray normal. Diagnosed with meniscal tear. Given steroid injection.   Left knee pain over a week, pain now 10/10, worse with walking, pain behind knee and radiates up her thigh, no improvement with NSAID's, felt a "pop yesterday", no erythema or swelling   HM Up to date other than could consider repeat mammogram (last in 12/2015)  Social Nonsmoker   Review of Systems     Objective:   Physical Exam BP 122/70   Pulse (!) 54   Temp 98.6 F (37 C) (Oral)   Ht 5\' 2"  (1.575 m)   Wt 203 lb 3.2 oz (92.2 kg)   SpO2 98%   BMI 37.17 kg/m   Abd: soft, no tenderness, normal bowel sounds, no rebound, negative Percell Miller, no organomegally MSK: Right Knee - no tenderness, no crepitus with flexion/extension, no swelling, no warmth, good endpoints with lachman/anterior drawer/valgus/varus stress test, negative McMurray, could not tolerate Thessely due to pain, +Grind test (medial side)  Colonoscopy 2010 for GI bleed - 2 polyps No previous imaging to knee found in EPIC.     Assessment & Plan:  Constipation Abdominal discomfort most consistent with constipation. GYN exam not  performed as patient denies current GYN symptoms.  -trial of Mirlax -continue to eat a diet high in fiber  Left knee pain One week history of knee pain. Xray negative per patient (will get records from [place]). Exam consistent with meniscal etiology. -continue NSAID's -ice daily -given home exercises/PT -if not improved in 1-2 weeks consider MRI

## 2017-01-09 LAB — HM DIABETES EYE EXAM

## 2017-01-10 ENCOUNTER — Ambulatory Visit (INDEPENDENT_AMBULATORY_CARE_PROVIDER_SITE_OTHER): Payer: Managed Care, Other (non HMO) | Admitting: Family Medicine

## 2017-01-10 DIAGNOSIS — M25562 Pain in left knee: Secondary | ICD-10-CM | POA: Diagnosis not present

## 2017-01-10 DIAGNOSIS — K5900 Constipation, unspecified: Secondary | ICD-10-CM

## 2017-01-10 HISTORY — DX: Pain in left knee: M25.562

## 2017-01-10 MED ORDER — POLYETHYLENE GLYCOL 3350 17 GM/SCOOP PO POWD: ORAL | 1 refills | Status: DC

## 2017-01-10 NOTE — Assessment & Plan Note (Signed)
One week history of knee pain. Xray negative per patient (will get records from [place]). Exam consistent with meniscal etiology. -continue NSAID's -ice daily -given home exercises/PT -if not improved in 1-2 weeks consider MRI

## 2017-01-10 NOTE — Assessment & Plan Note (Signed)
Abdominal discomfort most consistent with constipation. GYN exam not performed as patient denies current GYN symptoms.  -trial of Mirlax -continue to eat a diet high in fiber

## 2017-01-10 NOTE — Patient Instructions (Signed)
Great to see you today. Lets start with icing the knee and trying the home exercises. You can use over the counter antiinflammatory medication like aleve for knee pain. If stomach issues persist for more than a week give [place] a call.

## 2017-01-19 ENCOUNTER — Other Ambulatory Visit: Payer: Self-pay | Admitting: Family Medicine

## 2017-01-30 ENCOUNTER — Encounter: Payer: Self-pay | Admitting: Family Medicine

## 2017-02-24 ENCOUNTER — Ambulatory Visit (INDEPENDENT_AMBULATORY_CARE_PROVIDER_SITE_OTHER): Payer: Managed Care, Other (non HMO) | Admitting: Internal Medicine

## 2017-02-24 VITALS — BP 122/70 | HR 79 | Temp 98.8°F | Wt 202.0 lb

## 2017-02-24 DIAGNOSIS — M2550 Pain in unspecified joint: Secondary | ICD-10-CM

## 2017-02-24 NOTE — Patient Instructions (Signed)
We are going to get labs to up your joint pain, please follow up in two weeks to discuss results and next steps

## 2017-02-24 NOTE — Progress Notes (Signed)
   [facility] Kerrin Mo, MD Phone: [phone]  Reason For Visit: SDA for Joint Pain   # Joint Pain  - Having joint pain for several years  - Pain in shoulders,neck,  hands, wrist joints, feet, back of the neck  - Patient has pain in joints all the time  - No swelling joints, no fevers, no nausea or vomiting  - No weight loss or night sweats - No rashes or skin changes  - No numbness and not associated with motion  - Has been feeling tired  - No depression,  - Patient takes tylenol, ibuprofen - takes away a little bit of pain  - No hx of previously having this generalized joint pain worked up  - Patient has a hx of anemia, does not taking iron regularly  - No smoking, no other drugs, no etoh   Past Medical History Reviewed problem list.  Medications- reviewed and updated No additions to family history Social history- patient is a non-smoker  Objective: BP 122/70   Pulse 79   Temp 98.8 F (37.1 C) (Oral)   Wt 202 lb (91.6 kg)   SpO2 99%   BMI 36.95 kg/m  Gen: NAD, alert, cooperative with exam Cardio: regular rate and rhythm, S1S2 heard, no murmurs appreciated Pulm: clear to auscultation bilaterally, no wheezes, rhonchi or rales MSK: Point tenderness in shoulder joints, wrist, ankles, plantar surface of feet, and elbows, no swelling on other abnormalities noted on inspection, normal ROM at joints, 5/5 strength in upper and lower extremities, 2+ reflexes, no changes in sensation  Skin: No rashes or other skin abnormalities noted   Assessment/Plan: See problem based a/p  Generalized joint pain Non-inflammatory arthritis - no swelling in joints or hx of swelling in joints; doubt pos-infectious given length of time; given non-inflammatory nature unlikely to be SLE, RA.Marland Kitchen Patient is a bit young for PMR, however given distrubution in neck and shoulder could be PMR. Other diagnosis of exclusion include fibromyalgia. Given patient's location could  consider testing for lyme in the future - thought this was not performed at this visit  - Sedimentation Rate - C-reactive protein - ANA - CBC - CMP14+EGFR - Rheumatoid factor - Could consider getting Lyme titer per review of uptodate in the future - Follow up in two weeks for next steps

## 2017-02-25 ENCOUNTER — Other Ambulatory Visit: Payer: Managed Care, Other (non HMO)

## 2017-02-26 ENCOUNTER — Telehealth: Payer: Self-pay

## 2017-02-27 LAB — CBC
Hematocrit: 33.6 % — ABNORMAL LOW (ref 34.0–46.6)
Hemoglobin: 10 g/dL — ABNORMAL LOW (ref 11.1–15.9)
MCH: 24.1 pg — ABNORMAL LOW (ref 26.6–33.0)
MCHC: 29.8 g/dL — ABNORMAL LOW (ref 31.5–35.7)
MCV: 81 fL (ref 79–97)
Platelets: 314 x10E3/uL (ref 150–379)
RBC: 4.15 x10E6/uL (ref 3.77–5.28)
RDW: 17.1 % — ABNORMAL HIGH (ref 12.3–15.4)
WBC: 6.1 x10E3/uL (ref 3.4–10.8)

## 2017-02-27 LAB — CMP14+EGFR
A/G RATIO: 1.7 (ref 1.2–2.2)
ALT: 18 IU/L (ref 0–32)
AST: 24 IU/L (ref 0–40)
Albumin: 4.3 g/dL (ref 3.5–5.5)
Alkaline Phosphatase: 89 IU/L (ref 39–117)
BUN/Creatinine Ratio: 14 (ref 9–23)
BUN: 8 mg/dL (ref 6–24)
CHLORIDE: 99 mmol/L (ref 96–106)
CO2: 19 mmol/L — ABNORMAL LOW (ref 20–29)
Calcium: 9.4 mg/dL (ref 8.7–10.2)
Creatinine, Ser: 0.59 mg/dL (ref 0.57–1.00)
GFR calc non Af Amer: 111 mL/min/{1.73_m2} (ref >59)
GFR, EST AFRICAN AMERICAN: 128 mL/min/{1.73_m2} (ref >59)
Globulin, Total: 2.5 g/dL (ref 1.5–4.5)
Glucose: 124 mg/dL — ABNORMAL HIGH (ref 65–99)
POTASSIUM: 4 mmol/L (ref 3.5–5.2)
SODIUM: 138 mmol/L (ref 134–144)
Total Protein: 6.8 g/dL (ref 6.0–8.5)

## 2017-02-27 LAB — SEDIMENTATION RATE: Sed Rate: 26 mm/hr (ref 0–32)

## 2017-02-27 LAB — C-REACTIVE PROTEIN: CRP: 14.7 mg/L — ABNORMAL HIGH (ref 0.0–4.9)

## 2017-02-27 LAB — ANA: Anti Nuclear Antibody(ANA): NEGATIVE

## 2017-02-27 LAB — RHEUMATOID FACTOR: Rheumatoid fact SerPl-aCnc: <10 [IU]/mL (ref 0.0–13.9)

## 2017-02-28 ENCOUNTER — Telehealth: Payer: Self-pay | Admitting: Family Medicine

## 2017-02-28 NOTE — Telephone Encounter (Signed)
Pt would like someone to call her with lab results. ep

## 2017-02-28 NOTE — Telephone Encounter (Signed)
Error

## 2017-02-28 NOTE — Assessment & Plan Note (Signed)
Non-inflammatory arthritis - no swelling in joints or hx of swelling in joints; doubt pos-infectious given length of time; given non-inflammatory nature unlikely to be SLE, RA.Marland Kitchen Patient is a bit young for PMR, however given distrubution in neck and shoulder could be PMR. Other diagnosis of exclusion include fibromyalgia. Given patient's location could consider testing for lyme in the future - thought this was not performed at this visit  - Sedimentation Rate - C-reactive protein - ANA - CBC - CMP14+EGFR - Rheumatoid factor - Could consider getting Lyme titer per review of uptodate in the future - Follow up in two weeks for next steps

## 2017-02-28 NOTE — Telephone Encounter (Signed)
Please let patient know that none of her labs showed anything of concern. Please make sure that she makes a follow up appointment to discuss in further detail and to determine next steps.  Prabhnoor Ellenberger

## 2017-03-03 ENCOUNTER — Telehealth: Payer: Self-pay | Admitting: Family Medicine

## 2017-03-03 NOTE — Telephone Encounter (Signed)
Pt would like [place] to release her labs to MyChart and also call her with a detailed explanation of what the labs mean/ jw

## 2017-03-10 NOTE — Telephone Encounter (Signed)
Routing to Dr. Emmaline Life who saw patient & ordered these labs Adrienne Rio, MD

## 2017-03-11 NOTE — Telephone Encounter (Signed)
Called patient. She stated she had already received a call from the nurse regarding test results and had no other questions at this point. Discussed with patient the importance of adding a additional iron supplement which she was already aware of and take multi-vitamin in general.

## 2017-03-17 ENCOUNTER — Ambulatory Visit: Payer: Managed Care, Other (non HMO) | Admitting: Internal Medicine

## 2017-04-18 ENCOUNTER — Other Ambulatory Visit: Payer: Self-pay | Admitting: Family Medicine

## 2017-04-18 MED ORDER — ENALAPRIL-HYDROCHLOROTHIAZIDE 5-12.5 MG PO TABS: 1.0000 | ORAL_TABLET | Freq: Every day | ORAL | 1 refills | Status: DC

## 2017-07-25 ENCOUNTER — Other Ambulatory Visit: Payer: Self-pay | Admitting: Family Medicine

## 2017-07-25 MED ORDER — METOPROLOL SUCCINATE ER 50 MG PO TB24: ORAL_TABLET | ORAL | 1 refills | Status: DC

## 2017-10-15 ENCOUNTER — Other Ambulatory Visit: Payer: Self-pay | Admitting: Family Medicine

## 2017-11-25 ENCOUNTER — Ambulatory Visit (HOSPITAL_COMMUNITY): Payer: Managed Care, Other (non HMO)

## 2017-11-25 ENCOUNTER — Ambulatory Visit (HOSPITAL_COMMUNITY)
Admission: RE | Admit: 2017-11-25 | Discharge: 2017-11-25 | Disposition: A | Discharge status: Home / Self Care | Payer: Managed Care, Other (non HMO) | Source: Ambulatory Visit | Attending: Family Medicine | Admitting: Family Medicine

## 2017-11-25 ENCOUNTER — Ambulatory Visit (INDEPENDENT_AMBULATORY_CARE_PROVIDER_SITE_OTHER): Payer: Managed Care, Other (non HMO) | Admitting: Family Medicine

## 2017-11-25 VITALS — BP 120/60 | HR 58 | Temp 98.9°F | Wt 206.2 lb

## 2017-11-25 DIAGNOSIS — G629 Polyneuropathy, unspecified: Secondary | ICD-10-CM | POA: Diagnosis not present

## 2017-11-25 DIAGNOSIS — R0602 Shortness of breath: Secondary | ICD-10-CM | POA: Insufficient documentation

## 2017-11-25 DIAGNOSIS — R001 Bradycardia, unspecified: Secondary | ICD-10-CM | POA: Insufficient documentation

## 2017-11-25 DIAGNOSIS — D509 Iron deficiency anemia, unspecified: Secondary | ICD-10-CM

## 2017-11-25 DIAGNOSIS — I10 Essential (primary) hypertension: Secondary | ICD-10-CM | POA: Diagnosis not present

## 2017-11-25 HISTORY — DX: Shortness of breath: R06.02

## 2017-11-25 HISTORY — DX: Polyneuropathy, unspecified: G62.9

## 2017-11-25 LAB — POCT GLYCOSYLATED HEMOGLOBIN (HGB A1C): Hemoglobin A1C: 6.2

## 2017-11-25 NOTE — Progress Notes (Signed)
Date of Visit: 11/25/2017   HPI:  Adrienne Collins is a 47 y.o. woman here today for the following issues:  Shortness of Breath: -Episodes generally occur with exertion; SOB resolves with rest -Has noticed similar episodes of SOB in the past, but seem to be increasing in frequency over past 1 month -Endorses associated chest tightness but no chest pain -Denies associated nausea, diaphoresis, jaw pain with episodes of SOB; denies lower leg swell -occasionally forgets to take metoprolol, notes some improvement in shortness of breath when she takes it regularly -no wheezing. Nonsmoker. -history of stress test years ago.  Leg Pain: -Patient has had 3-4 episodes of burning pain in bilateral lower medial leg lasting 20-30 minutes over the past 5 months -Pain severity is initially an 8/10 but gradually subsides -During episodes of pain, patient also notices tenderness of area that sometimes radiates to lateral part of leg -Pain usually occurs while patient is sitting at work -not brought on by exertion -Denies numbness or tingling -Denies recent changes in activity level or footwear -no need to move feet excessively at night  hypertension - taking enalapril-HCTZ 5-12.5mg  daily and metoprolol xl 50mg  daily.   Anemia - not taking iron consistently. Has heavy periods. No intermenstrual bleeding. Not sexually active.  ROS: See HPI.   PMHx: Recurrent palpitations, LVH, iron deficiency anemia, hypertension  PHYSICAL EXAM: BP 120/60 (BP Location: Left Arm, Patient Position: Sitting, Cuff Size: Large)   Pulse (!) 58   Temp 98.9 F (37.2 C) (Oral)   Wt 206 lb 3.2 oz (93.5 kg)   SpO2 99%   BMI 37.71 kg/m  Gen: well-appearing, sitting comfortable in chair HEENT: moist mucous membranes, no lympadenopathy Heart: RRR, no murmurs Lungs: CTAB, NWOB Neuro: grossly nonfocal, normal speech Ext: no edema, sensation intact over bilateral lower extremities   ASSESSMENT/PLAN:   Anemia, iron  deficiency -Rechecking CBC and iron studies today. Question whether IDA could be contributing to lower leg symptoms.  -Patient admits to not taking iron consistently; changed dosing to every other day to help improve compliance -Patient still having heavy periods, but she is not interested in birth control at this time  Shortness of breath Worsening exertional SOB and associated chest tightness without oxygen desaturation today in clinic. Greatest concern would be cardiac etiology. EKG today without signs of ischemia. No symptoms presently. -Referral to cardiology for further diagnostic work up, anticipate she may need stress test, or echo at minimum -Counseled on return precautions for SOB and/or chest tightness that does not resolve within 30 minutes or severe  HYPERTENSION, BENIGN SYSTEMIC Well-controlled on current medication regimen. -Checking lipids today & CMET  Neuropathy Burning quality of leg pain suggests neuropathic origin. May also be related to iron deficiency anemia. -Ordered B12, HbA1c, HIV, RPR tests to determine underlying cause of neuropathy -Check iron studies & replete if low   FOLLOW UP:  Follow up in 3 months. Referring to cardiology  Note written by Michael Litter, MS3.  Patient seen along with MS3 student Michael Litter. I personally evaluated this patient along with the student, and verified all aspects of the history, physical exam, and medical decision making as documented by the student. I agree with the student's documentation and have made all necessary edits.   Chrisandra Netters, MD [place]

## 2017-11-25 NOTE — Patient Instructions (Addendum)
It was great seeing you today! We are getting some labs to get a better idea of what is causing your leg pain. We are also checking your cholesterol, kidney function, electrolytes and iron and hemoglobin levels today.  I also put in a referral for you to see a cardiologist to follow up on the shortness of breath and chest tightness that you have been experiencing.  If you have any chest pain that does not go away within 30 minutes, is accompanied by nausea, sweating, shortness of breath, or made worse by activity, go to the emergency room immediately for evaluation.   Come back to check in with me in 3 months, sooner if needed.  Be well, Dr. Ardelia Mems

## 2017-11-25 NOTE — Assessment & Plan Note (Addendum)
Well-controlled on current medication regimen. -Checking lipids today & CMET

## 2017-11-25 NOTE — Assessment & Plan Note (Addendum)
-  Rechecking CBC and iron studies today. Question whether IDA could be contributing to lower leg symptoms.  -Patient admits to not taking iron consistently; changed dosing to every other day to help improve compliance -Patient still having heavy periods, but she is not interested in birth control at this time

## 2017-11-25 NOTE — Assessment & Plan Note (Addendum)
Burning quality of leg pain suggests neuropathic origin. May also be related to iron deficiency anemia. -Ordered B12, HbA1c, HIV, RPR tests to determine underlying cause of neuropathy -Check iron studies & replete if low

## 2017-11-25 NOTE — Assessment & Plan Note (Addendum)
Worsening exertional SOB and associated chest tightness without oxygen desaturation today in clinic. Greatest concern would be cardiac etiology. EKG today without signs of ischemia. No symptoms presently. -Referral to cardiology for further diagnostic work up, anticipate she may need stress test, or echo at minimum -Counseled on return precautions for SOB and/or chest tightness that does not resolve within 30 minutes or severe

## 2017-11-26 LAB — VITAMIN B12: Vitamin B-12: 415 pg/mL (ref 232–1245)

## 2017-11-26 LAB — CBC WITH DIFFERENTIAL/PLATELET
BASOS: 1 %
Basophils Absolute: 0.1 10*3/uL (ref 0.0–0.2)
EOS (ABSOLUTE): 0.2 10*3/uL (ref 0.0–0.4)
EOS: 4 %
HEMATOCRIT: 29.8 % — AB (ref 34.0–46.6)
Hemoglobin: 8.9 g/dL — ABNORMAL LOW (ref 11.1–15.9)
Immature Grans (Abs): 0 10*3/uL (ref 0.0–0.1)
Immature Granulocytes: 0 %
LYMPHS ABS: 1.6 10*3/uL (ref 0.7–3.1)
Lymphs: 29 %
MCH: 21.8 pg — AB (ref 26.6–33.0)
MCHC: 29.9 g/dL — AB (ref 31.5–35.7)
MCV: 73 fL — AB (ref 79–97)
MONOS ABS: 0.4 10*3/uL (ref 0.1–0.9)
Monocytes: 8 %
NEUTROS ABS: 3.1 10*3/uL (ref 1.4–7.0)
Neutrophils: 58 %
PLATELETS: 417 10*3/uL — AB (ref 150–379)
RBC: 4.08 x10E6/uL (ref 3.77–5.28)
RDW: 18.8 % — AB (ref 12.3–15.4)
WBC: 5.4 10*3/uL (ref 3.4–10.8)

## 2017-11-26 LAB — COMPREHENSIVE METABOLIC PANEL
ALBUMIN: 4.7 g/dL (ref 3.5–5.5)
ALK PHOS: 93 IU/L (ref 39–117)
ALT: 16 IU/L (ref 0–32)
AST: 19 IU/L (ref 0–40)
Albumin/Globulin Ratio: 1.8 (ref 1.2–2.2)
BUN / CREAT RATIO: 14 (ref 9–23)
BUN: 9 mg/dL (ref 6–24)
Bilirubin Total: <0.2 mg/dL (ref 0.0–1.2)
CO2: 21 mmol/L (ref 20–29)
CREATININE: 0.66 mg/dL (ref 0.57–1.00)
Calcium: 9.6 mg/dL (ref 8.7–10.2)
Chloride: 102 mmol/L (ref 96–106)
GFR calc non Af Amer: 106 mL/min/{1.73_m2} (ref >59)
GFR, EST AFRICAN AMERICAN: 123 mL/min/{1.73_m2} (ref >59)
GLOBULIN, TOTAL: 2.6 g/dL (ref 1.5–4.5)
GLUCOSE: 113 mg/dL — AB (ref 65–99)
Potassium: 4.3 mmol/L (ref 3.5–5.2)
SODIUM: 138 mmol/L (ref 134–144)
TOTAL PROTEIN: 7.3 g/dL (ref 6.0–8.5)

## 2017-11-26 LAB — LIPID PANEL
CHOLESTEROL TOTAL: 175 mg/dL (ref 100–199)
Chol/HDL Ratio: 3.6 ratio (ref 0.0–4.4)
HDL: 48 mg/dL (ref >39)
LDL Calculated: 110 mg/dL — ABNORMAL HIGH (ref 0–99)
Triglycerides: 86 mg/dL (ref 0–149)
VLDL CHOLESTEROL CAL: 17 mg/dL (ref 5–40)

## 2017-11-26 LAB — HIV ANTIBODY (ROUTINE TESTING W REFLEX): HIV SCREEN 4TH GENERATION: NONREACTIVE

## 2017-11-26 LAB — IRON,TIBC AND FERRITIN PANEL
FERRITIN: 10 ng/mL — AB (ref 15–150)
Iron Saturation: 4 % — CL (ref 15–55)
Iron: 19 ug/dL — ABNORMAL LOW (ref 27–159)
TIBC: 443 ug/dL (ref 250–450)
UIBC: 424 ug/dL (ref 131–425)

## 2017-11-26 LAB — RPR: RPR: NONREACTIVE

## 2017-12-03 ENCOUNTER — Ambulatory Visit (INDEPENDENT_AMBULATORY_CARE_PROVIDER_SITE_OTHER): Payer: Managed Care, Other (non HMO) | Admitting: Cardiology

## 2017-12-03 ENCOUNTER — Encounter: Payer: Self-pay | Admitting: Cardiology

## 2017-12-03 VITALS — BP 110/72 | HR 52 | Ht 62.0 in | Wt 204.8 lb

## 2017-12-03 DIAGNOSIS — R0789 Other chest pain: Secondary | ICD-10-CM | POA: Diagnosis not present

## 2017-12-03 DIAGNOSIS — R002 Palpitations: Secondary | ICD-10-CM

## 2017-12-03 DIAGNOSIS — D508 Other iron deficiency anemias: Secondary | ICD-10-CM

## 2017-12-03 HISTORY — DX: Palpitations: R00.2

## 2017-12-03 NOTE — Progress Notes (Signed)
Cardiology Office Note:    Date:  12/03/2017   ID:  Adrienne Collins, DOB [birth year], MRN [id number]  PCP:  Leeanne Rio, MD  Cardiologist:  Jenean Lindau, MD   Referring MD: Leeanne Rio, MD    ASSESSMENT:    1. Chest tightness   2. Other iron deficiency anemia   3. Palpitations    PLAN:    In order of problems listed above:  1. Primary prevention stressed with the patient.  Compliance with diet and medication stressed and she vocalized understanding.  Her blood pressure is stable.  I advised her to take metoprolol in the morning.  Currently she is taking it at night.  Diet was discussed for obesity and essential hypertension and risks of obesity explained to vocalized understanding.  Diet was also clarified for diabetes mellitus and this is followed by primary physician. 2. Patient will have an echocardiogram to assess murmur on auscultation.  She has complaints of palpitations and therefore we will obtain a 48-hour Holter monitor.  Patient denies chest tightness.  This is atypical however she has multiple risk factors for coronary disease as mentioned above and also sleep and obstructive sleep apnea symptoms.  In view of this he would do a exercise stress echo. 3. Patient will be seen in follow-up appointment in 3 months or earlier if the patient has any concerns    Medication Adjustments/Labs and Tests Ordered: Current medicines are reviewed at length with the patient today.  Concerns regarding medicines are outlined above.  No orders of the defined types were placed in this encounter.  No orders of the defined types were placed in this encounter.    History of Present Illness:    Adrienne Collins is a 47 y.o. female who is being seen today for the evaluation of chest tightness and palpitations at the request of Ardelia Mems, Delorse Limber, MD.  Patient is a pleasant 47 year old female.  She has a past medical history of essential hypertension, diet-controlled  diabetes mellitus, obesity.  She mentions to me that she leads a sedentary lifestyle.  She occasionally has chest tightness and this is concerning.  No orthopnea or PND.  She is feeling sedentary lifestyle.  Her chest tightness does not radiate to the neck or to the operative there is no consistent occurrence with exertion.  At the time of my evaluation, the patient is alert awake oriented and in no distress.  She also gives me a history of palpitations that occurred on a daily basis but do not trouble her.  Past Medical History:  Diagnosis Date  . Arthritis   . Flank pain 06/27/2014  . Glucose intolerance (impaired glucose tolerance) 12/08/2012   A1c of 6.2 with one random >200 and one fasting capilllary blood glucose of 129   . History of ETT 08/2006   no sustained tachycardia  . Hypertension   . Lumbar paraspinal muscle spasm 06/14/2013  . Prediabetes   . S/P colonoscopy 10/27/2008       Past Surgical History:  Procedure Laterality Date  . CESAREAN SECTION  1990  . CESAREAN SECTION  1991  . SALPINGECTOMY     right    Current Medications: Current Meds  Medication Sig  . acetaminophen (TYLENOL) 650 MG CR tablet Take 2 tablets (1,300 mg total) by mouth every 8 (eight) hours as needed for pain. take 2 tabs every 6 hours as needed for pain  . Enalapril-hydroCHLOROthiazide 5-12.5 MG tablet TAKE 1 TABLET DAILY  . ferrous  sulfate 325 (65 FE) MG tablet Take 325 mg by mouth daily with breakfast.  . ibuprofen (ADVIL,MOTRIN) 200 MG tablet Take 800 mg by mouth every 6 (six) hours as needed for pain.  . metoprolol succinate (TOPROL-XL) 50 MG 24 hr tablet TAKE 1 TABLET DAILY, TAKE WITH OR IMMEDIATELY FOLLOWING A MEAL  . polyethylene glycol powder (GLYCOLAX/MIRALAX) powder Use 17 g twice a day for 2 days, then 17 g or less, daily until stools are soft but not loose.     Allergies:   Patient has no known allergies.   Social History   Socioeconomic History  . Marital status: Single     Spouse name: Not on file  . Number of children: 2  . Years of education: 1 GED  . Highest education level: Not on file  Occupational History  . Occupation: NURSING ASSIST    Employer: [employer]  Social Needs  . Financial resource strain: Not on file  . Food insecurity:    Worry: Not on file    Inability: Not on file  . Transportation needs:    Medical: Not on file    Non-medical: Not on file  Tobacco Use  . Smoking status: Former Smoker    Last attempt to quit: 01/30/1994    Years since quitting: 23.8  . Smokeless tobacco: Never Used  Substance and Sexual Activity  . Alcohol use: No    Comment: quit 1995  . Drug use: No  . Sexual activity: Not Currently  Lifestyle  . Physical activity:    Days per week: Not on file    Minutes per session: Not on file  . Stress: Not on file  Relationships  . Social connections:    Talks on phone: Not on file    Gets together: Not on file    Attends religious service: Not on file    Active member of club or organization: Not on file    Attends meetings of clubs or organizations: Not on file    Relationship status: Not on file  Other Topics Concern  . Not on file  Social History Narrative   Never married, celebate since 2000   Son, Traer, odd jobs, living at home   Son, Regino Schultze, Ship broker at [employer], living at home   [place] since 01/2012, [place] and [employer]   Employed at [employer]   Attends [employer]                 Family History: The patient's family history includes Diabetes in her brother and mother; Hypertension in her mother; Stroke in her brother and sister.  ROS:   Please see the history of present illness.    All other systems reviewed and are negative.  EKGs/Labs/Other Studies Reviewed:    The following studies were reviewed today: EKG revealed sinus rhythm and nonspecific ST changes   Recent Labs: 11/25/2017: ALT 16; BUN 9; Creatinine, Ser 0.66; Hemoglobin 8.9;  Platelets 417; Potassium 4.3; Sodium 138  Recent Lipid Panel    Component Value Date/Time   CHOL 175 11/25/2017 1033   TRIG 86 11/25/2017 1033   HDL 48 11/25/2017 1033   CHOLHDL 3.6 11/25/2017 1033   CHOLHDL 3.9 06/07/2016 1708   VLDL 24 06/07/2016 1708   LDLCALC 110 (H) 11/25/2017 1033    Physical Exam:    VS:  BP 110/72 (BP Location: Left Arm, Patient Position: Sitting)   Pulse (!) 52   Ht 5\' 2"  (1.575 m)  Wt 204 lb 12.8 oz (92.9 kg)   SpO2 99%   BMI 37.46 kg/m     Wt Readings from Last 3 Encounters:  12/03/17 204 lb 12.8 oz (92.9 kg)  11/25/17 206 lb 3.2 oz (93.5 kg)  02/24/17 202 lb (91.6 kg)     GEN: Patient is in no acute distress HEENT: Normal NECK: No JVD; No carotid bruits LYMPHATICS: No lymphadenopathy CARDIAC: S1 S2 regular, 2/6 systolic murmur at the apex. RESPIRATORY:  Clear to auscultation without rales, wheezing or rhonchi  ABDOMEN: Soft, non-tender, non-distended MUSCULOSKELETAL:  No edema; No deformity  SKIN: Warm and dry NEUROLOGIC:  Alert and oriented x 3 PSYCHIATRIC:  Normal affect    Signed, Jenean Lindau, MD  12/03/2017 9:33 AM    [facility]

## 2017-12-03 NOTE — Patient Instructions (Signed)
Medication Instructions:  Your physician recommends that you continue on your current medications as directed. Please refer to the Current Medication list given to you today.  Labwork: Your physician recommends that you have the following labs drawn: TSH  Testing/Procedures: Your physician has requested that you have a stress echocardiogram. For further information please visit [email or web address]. Please follow instruction sheet as given.  Your physician has requested that you have an echocardiogram. Echocardiography is a painless test that uses sound waves to create images of your heart. It provides your doctor with information about the size and shape of your heart and how well your heart's chambers and valves are working. This procedure takes approximately one hour. There are no restrictions for this procedure.  Your physician has recommended that you wear a holter monitor. Holter monitors are medical devices that record the heart's electrical activity. Doctors most often use these monitors to diagnose arrhythmias. Arrhythmias are problems with the speed or rhythm of the heartbeat. The monitor is a small, portable device. You can wear one while you do your normal daily activities. This is usually used to diagnose what is causing palpitations/syncope (passing out).  Follow-Up: Your physician recommends that you schedule a follow-up appointment in: August, 2019  Any Other Special Instructions Will Be Listed Below (If Applicable).     If you need a refill on your cardiac medications before your next appointment, please call your pharmacy.   [place], RN, BSN    Holter Monitoring A Holter monitor is a small device that is used to detect abnormal heart rhythms. It clips to your clothing and is connected by wires to flat, sticky disks (electrodes) that attach to your chest. It is worn continuously for 24-48 hours. Follow these instructions at home:  Wear your Holter  monitor at all times, even while exercising and sleeping, for as long as directed by your health care provider.  Make sure that the Holter monitor is safely clipped to your clothing or close to your body as recommended by your health care provider.  Do not get the monitor or wires wet.  Do not put body lotion or moisturizer on your chest.  Keep your skin clean.  Keep a diary of your daily activities, such as walking and doing chores. If you feel that your heartbeat is abnormal or that your heart is fluttering or skipping a beat: ? Record what you are doing when it happens. ? Record what time of day the symptoms occur.  Return your Holter monitor as directed by your health care provider.  Keep all follow-up visits as directed by your health care provider. This is important. Get help right away if:  You feel lightheaded or you faint.  You have trouble breathing.  You feel pain in your chest, upper arm, or jaw.  You feel sick to your stomach and your skin is pale, cool, or damp.  You heartbeat feels unusual or abnormal. This information is not intended to replace advice given to you by your health care provider. Make sure you discuss any questions you have with your health care provider. Document Released: 04/26/2004 Document Revised: 01/04/2016 Document Reviewed: 03/07/2014 Elsevier Interactive Patient Education  2018 [employer]. Echocardiogram An echocardiogram, or echocardiography, uses sound waves (ultrasound) to produce an image of your heart. The echocardiogram is simple, painless, obtained within a short period of time, and offers valuable information to your health care provider. The images from an echocardiogram can provide information such as:  Evidence of coronary artery disease (CAD).  Heart size.  Heart muscle function.  Heart valve function.  Aneurysm detection.  Evidence of a past heart attack.  Fluid buildup around the heart.  Heart muscle  thickening.  Assess heart valve function.  Tell a health care provider about:  Any allergies you have.  All medicines you are taking, including vitamins, herbs, eye drops, creams, and over-the-counter medicines.  Any problems you or family members have had with anesthetic medicines.  Any blood disorders you have.  Any surgeries you have had.  Any medical conditions you have.  Whether you are pregnant or may be pregnant. What happens before the procedure? No special preparation is needed. Eat and drink normally. What happens during the procedure?  In order to produce an image of your heart, gel will be applied to your chest and a wand-like tool (transducer) will be moved over your chest. The gel will help transmit the sound waves from the transducer. The sound waves will harmlessly bounce off your heart to allow the heart images to be captured in real-time motion. These images will then be recorded.  You may need an IV to receive a medicine that improves the quality of the pictures. What happens after the procedure? You may return to your normal schedule including diet, activities, and medicines, unless your health care provider tells you otherwise. This information is not intended to replace advice given to you by your health care provider. Make sure you discuss any questions you have with your health care provider. Document Released: 07/26/2000 Document Revised: 03/16/2016 Document Reviewed: 04/05/2013 Elsevier Interactive Patient Education  2017 [place].  Exercise Stress Echocardiogram, Care After This sheet gives you information about how to care for yourself after your procedure. Your doctor may also give you more specific instructions. If you have problems or questions, call your doctor. Follow these instructions at home:  You may do these things as told by your doctor: ? Eat what you normally eat. ? Do your normal activities. ? Take your normal medicines.  Take  over-the-counter and prescription medicines only as told by your doctor.  Keep all follow-up visits as told by your doctor. This is important. Contact a doctor if:  You keep feeling dizzy or light-headed.  You feel like your heart is beating fast.  You keep feeling sick to your stomach (nauseous) or you throw up (vomit).  You have a headache.  You feel short of breath. Get help right away if:  You have pain or pressure in any of these areas: ? Your chest. ? Your jaw or neck. ? Between your shoulder blades. ? Down your left arm.  You pass out (faint).  You have trouble breathing. Summary  After your procedure, you may eat like normal, do your normal activities, and take your normal medicines as told by your doctor.  Contact your doctor if you have dizziness, a fast heartbeat, or a headache.  You should also contact your doctor if you feel sick to your stomach (nauseous), you throw up (vomit), or you feel short of breath.  Get help right away if you feel pain or pressure in any of these areas: your chest, jaw, neck, between your shoulder blades, or down your right arm.  You should also get help right away if you pass out (faint) or have trouble breathing. This information is not intended to replace advice given to you by your health care provider. Make sure you discuss any questions you have with  your health care provider. Document Released: 05/19/2013 Document Revised: 04/22/2016 Document Reviewed: 04/22/2016 Elsevier Interactive Patient Education  2017 [employer].

## 2017-12-04 ENCOUNTER — Telehealth: Payer: Self-pay | Admitting: Family Medicine

## 2017-12-04 DIAGNOSIS — D509 Iron deficiency anemia, unspecified: Secondary | ICD-10-CM

## 2017-12-04 LAB — TSH: TSH: 1.34 u[IU]/mL (ref 0.450–4.500)

## 2017-12-04 NOTE — Telephone Encounter (Signed)
Called patient to discuss labs. Quite iron deficient. Patient is committed to taking iron more consistently. Will refer to GI as it's been almost 10 years since her last colonoscopy, and I'm not sure that all of her IDA is explained by menorrhagia. Recheck Hgb in 6 weeks, patient instructed to schedule lab visit. Patient agreeable to this plan.  Also discussed prediabetic level of A1c at 6.2. Once hgb is improved she is encouraged to exercise to lose weight.   Leeanne Rio, MD

## 2017-12-05 ENCOUNTER — Encounter: Payer: Self-pay | Admitting: Gastroenterology

## 2017-12-16 ENCOUNTER — Other Ambulatory Visit: Payer: Self-pay | Admitting: Family Medicine

## 2017-12-26 ENCOUNTER — Ambulatory Visit (HOSPITAL_BASED_OUTPATIENT_CLINIC_OR_DEPARTMENT_OTHER)
Admission: RE | Admit: 2017-12-26 | Discharge: 2017-12-26 | Disposition: A | Discharge status: Home / Self Care | Payer: Managed Care, Other (non HMO) | Source: Ambulatory Visit | Attending: Family Medicine | Admitting: Family Medicine

## 2017-12-26 DIAGNOSIS — I517 Cardiomegaly: Secondary | ICD-10-CM | POA: Diagnosis not present

## 2017-12-26 DIAGNOSIS — I340 Nonrheumatic mitral (valve) insufficiency: Secondary | ICD-10-CM | POA: Diagnosis not present

## 2017-12-26 DIAGNOSIS — R0789 Other chest pain: Secondary | ICD-10-CM

## 2017-12-26 DIAGNOSIS — R002 Palpitations: Secondary | ICD-10-CM | POA: Diagnosis not present

## 2017-12-26 NOTE — Progress Notes (Signed)
Echocardiogram 2D Echocardiogram has been performed.  Adrienne Collins 12/26/2017, 12:31 PM

## 2017-12-29 ENCOUNTER — Ambulatory Visit (HOSPITAL_BASED_OUTPATIENT_CLINIC_OR_DEPARTMENT_OTHER)
Admission: RE | Admit: 2017-12-29 | Discharge: 2017-12-29 | Disposition: A | Discharge status: Home / Self Care | Payer: Managed Care, Other (non HMO) | Source: Ambulatory Visit | Attending: Cardiology | Admitting: Cardiology

## 2017-12-29 ENCOUNTER — Ambulatory Visit: Payer: Managed Care, Other (non HMO)

## 2017-12-29 DIAGNOSIS — R002 Palpitations: Secondary | ICD-10-CM | POA: Diagnosis not present

## 2017-12-29 DIAGNOSIS — R079 Chest pain, unspecified: Secondary | ICD-10-CM

## 2017-12-29 DIAGNOSIS — R0789 Other chest pain: Secondary | ICD-10-CM

## 2017-12-29 NOTE — Progress Notes (Signed)
  Echocardiogram Echocardiogram Stress Test has been performed.  Adrienne Collins Adrienne Collins 12/29/2017, 1:53 PM

## 2018-01-07 ENCOUNTER — Telehealth: Payer: Self-pay | Admitting: Family Medicine

## 2018-01-07 DIAGNOSIS — N644 Mastodynia: Secondary | ICD-10-CM

## 2018-01-07 NOTE — Telephone Encounter (Signed)
Pt is calling for a referral to have a mammogram. She didn't say why she wanted this just that she thought she should have one. jw

## 2018-01-08 NOTE — Telephone Encounter (Signed)
I gave verbal order for diagnostic mammogram yesterday AM at [facility] as patient was already there for an appointment and they needed the order right then.  Attempted to call patient to discuss why she needed a diagnostic mammogram. LVM asking patient to call back to discuss.  Leeanne Rio, MD

## 2018-01-09 NOTE — Telephone Encounter (Signed)
lmovm on nurse line, returning call.    I attempted to call back, but had to lm for callback. Sheryl Saintil, Salome Spotted, CMA

## 2018-01-09 NOTE — Telephone Encounter (Signed)
I attempted to call patient again just now, no answer. Did not LVM as Janett Billow already had earlier today. Leeanne Rio, MD

## 2018-01-13 NOTE — Telephone Encounter (Signed)
Not sure if it's needed but I put in order.

## 2018-01-13 NOTE — Addendum Note (Signed)
Addended byMingo Amber, Kayleen Memos on: 01/13/2018 04:28 PM   Modules accepted: Orders

## 2018-01-13 NOTE — Telephone Encounter (Signed)
Patient returned call and stated she has some pain/soreness in left breast and possible swelling. Verbal was given to [facility] on 01/07/18 but they had already rescheduled patient. Patient appt is tomorrow at [facility]. Please put in order if appropriate.  Call back is [phone].  Danley Danker, RN [facility] RN)

## 2018-01-15 ENCOUNTER — Other Ambulatory Visit: Payer: Self-pay | Admitting: Family Medicine

## 2018-01-16 NOTE — Telephone Encounter (Signed)
I received results for mammogram and ultrasound that were both normal. Attempted to call patient to discuss these results and specifically to ask her to come in for a visit if she is still having these symptoms.  Need to also inform patient that if she is having symptoms prompting need for diagnostic mammogram or ultrasound she needs to be seen in office before just contacting mammography office, as she did this time.  No answer. LVM asking her to call back so I can speak with her.  Leeanne Rio, MD

## 2018-01-24 ENCOUNTER — Other Ambulatory Visit: Payer: Self-pay | Admitting: Family Medicine

## 2018-01-24 DIAGNOSIS — N644 Mastodynia: Secondary | ICD-10-CM

## 2018-01-26 ENCOUNTER — Other Ambulatory Visit (INDEPENDENT_AMBULATORY_CARE_PROVIDER_SITE_OTHER): Payer: Managed Care, Other (non HMO)

## 2018-01-26 ENCOUNTER — Other Ambulatory Visit: Payer: Self-pay

## 2018-01-26 ENCOUNTER — Encounter: Payer: Self-pay | Admitting: Gastroenterology

## 2018-01-26 ENCOUNTER — Ambulatory Visit (INDEPENDENT_AMBULATORY_CARE_PROVIDER_SITE_OTHER): Payer: Managed Care, Other (non HMO) | Admitting: Gastroenterology

## 2018-01-26 VITALS — BP 108/74 | HR 76 | Ht 60.5 in | Wt 205.1 lb

## 2018-01-26 DIAGNOSIS — R195 Other fecal abnormalities: Secondary | ICD-10-CM | POA: Diagnosis not present

## 2018-01-26 DIAGNOSIS — D500 Iron deficiency anemia secondary to blood loss (chronic): Secondary | ICD-10-CM | POA: Diagnosis not present

## 2018-01-26 LAB — CBC WITH DIFFERENTIAL/PLATELET
BASOS ABS: 0 10*3/uL (ref 0.0–0.1)
BASOS PCT: 0.7 % (ref 0.0–3.0)
EOS ABS: 0.3 10*3/uL (ref 0.0–0.7)
Eosinophils Relative: 3.8 % (ref 0.0–5.0)
HEMATOCRIT: 31.8 % — AB (ref 36.0–46.0)
HEMOGLOBIN: 10 g/dL — AB (ref 12.0–15.0)
Lymphocytes Relative: 28.5 % (ref 12.0–46.0)
Lymphs Abs: 1.9 10*3/uL (ref 0.7–4.0)
MCHC: 31.5 g/dL (ref 30.0–36.0)
MCV: 73.3 fl — ABNORMAL LOW (ref 78.0–100.0)
Monocytes Absolute: 0.7 10*3/uL (ref 0.1–1.0)
Monocytes Relative: 10.4 % (ref 3.0–12.0)
Neutro Abs: 3.8 10*3/uL (ref 1.4–7.7)
Neutrophils Relative %: 56.6 % (ref 43.0–77.0)
Platelets: 332 10*3/uL (ref 150.0–400.0)
RBC: 4.34 Mil/uL (ref 3.87–5.11)
RDW: 20.7 % — ABNORMAL HIGH (ref 11.5–15.5)
WBC: 6.8 10*3/uL (ref 4.0–10.5)

## 2018-01-26 LAB — IBC PANEL
IRON: 47 ug/dL (ref 42–145)
Saturation Ratios: 10.1 % — ABNORMAL LOW (ref 20.0–50.0)
TRANSFERRIN: 333 mg/dL (ref 212.0–360.0)

## 2018-01-26 LAB — FERRITIN: Ferritin: 10 ng/mL (ref 10.0–291.0)

## 2018-01-26 NOTE — Patient Instructions (Addendum)
If you are age 46 or older, your body mass index should be between 23-30. Your Body mass index is 39.4 kg/m. If this is out of the aforementioned range listed, please consider follow up with your Primary Care Provider.  If you are age 21 or younger, your body mass index should be between 19-25. Your Body mass index is 39.4 kg/m. If this is out of the aformentioned range listed, please consider follow up with your Primary Care Provider.   You have been scheduled for an EGD / colon. Please follow written instructions given to you at your visit today. If you use inhalers (even only as needed), please bring them with you on the day of your procedure. Your physician has requested that you go to www.startemmi.com and enter the access code given to you at your visit today. This web site gives a general overview about your procedure. However, you should still follow specific instructions given to you by our office regarding your preparation for the procedure.  Your provider has requested that you go to the basement level for lab work before leaving today. Press "B" on the elevator. The lab is located at the first door on the left as you exit the elevator.  It was a pleasure to meet you today!  Dr. Loletha Carrow

## 2018-01-26 NOTE — Progress Notes (Signed)
[facility] Consult Note:  History: Adrienne Collins 01/26/2018  Referring physician: Leeanne Rio, MD  Reason for consult/chief complaint: Anemia and heme positive stool test   Subjective  HPI:  This is a very pleasant 47 year old woman referred by family medicine clinic for iron deficiency anemia. I reviewed those clinic notes from April 16 and phone note from April 25 related to anemia.  This patient has menorrhagia that has been felt to be the cause for her iron deficiency anemia, and she reportedly does not consistently take oral iron supplements.  Because of the degree and persistence of anemia, there was concern that perhaps it could be a source of GI blood loss as well.  She was therefore referred to [place] for consideration of colonoscopy.  Adrienne Collins last underwent colonoscopy by Dr. Deatra Ina in March 2010 for heme positive stool.  Diverticulosis and an inflammatory polyp were discovered.  Adrienne Collins has started taking iron tablets about every day.  If she takes more than that, it seems to upset her stomach.  She has not noticed black or bloody stools.  She denies dysphagia, odynophagia, nausea, vomiting, early satiety or weight loss.  She has heavy menses for a total of 7 days, more so in the first several days.  It has been a long time since she is seeing gynecology, does not recall having had a discussion with them about treatment of menorrhagia.  She also reports that a recent stool card test was positive, though I cannot find documentation of this in her chart.  ROS:  Review of Systems  Constitutional: Negative for appetite change and unexpected weight change.  HENT: Negative for mouth sores and voice change.   Eyes: Negative for pain and redness.  Respiratory: Negative for cough and shortness of breath.   Cardiovascular: Negative for chest pain and palpitations.  Genitourinary: Negative for dysuria and hematuria.  Musculoskeletal: Negative for arthralgias  and myalgias.  Skin: Negative for pallor and rash.  Neurological: Negative for weakness and headaches.  Hematological: Negative for adenopathy.     Past Medical History: Past Medical History:  Diagnosis Date  . Anemia   . Arthritis   . Colon polyps   . Flank pain 06/27/2014  . Glucose intolerance (impaired glucose tolerance) 12/08/2012   A1c of 6.2 with one random >200 and one fasting capilllary blood glucose of 129   . History of ETT 08/2006   no sustained tachycardia  . Hypertension   . Lumbar paraspinal muscle spasm 06/14/2013  . Prediabetes   . S/P colonoscopy 10/27/2008     . Sleep apnea      Past Surgical History: Past Surgical History:  Procedure Laterality Date  . CESAREAN SECTION  1990  . CESAREAN SECTION  1991  . SALPINGECTOMY Right      Family History: Family History  Problem Relation Age of Onset  . Stroke Sister        drug abuse  . Diabetes Brother   . Kidney failure Brother   . Diabetes Mother   . Hypertension Mother   . Heart failure Mother   . Kidney disease Mother   . Lung cancer Maternal Grandfather        smoker    Social History: Social History   Socioeconomic History  . Marital status: Single    Spouse name: Not on file  . Number of children: 2  . Years of education: 51 GED  . Highest education level: Not on file  Occupational History  .  Occupation: Activity Engineer, materials: [employer]  Social Needs  . Financial resource strain: Not on file  . Food insecurity:    Worry: Not on file    Inability: Not on file  . Transportation needs:    Medical: Not on file    Non-medical: Not on file  Tobacco Use  . Smoking status: Former Smoker    Last attempt to quit: 01/30/1994    Years since quitting: 24.0  . Smokeless tobacco: Never Used  Substance and Sexual Activity  . Alcohol use: No    Comment: quit 1995  . Drug use: No  . Sexual activity: Not Currently  Lifestyle  . Physical activity:    Days per  week: Not on file    Minutes per session: Not on file  . Stress: Not on file  Relationships  . Social connections:    Talks on phone: Not on file    Gets together: Not on file    Attends religious service: Not on file    Active member of club or organization: Not on file    Attends meetings of clubs or organizations: Not on file    Relationship status: Not on file  Other Topics Concern  . Not on file  Social History Narrative   Never married, celebate since 2000   Son, Adrienne Collins, odd jobs, living at home   Son, Adrienne Collins, Ship broker at [employer], living at home   [place] [facility] since 01/2012, [place] [facility] [employer]   Employed at [employer]   Attends [employer]               She is an Doctor, hospital for an assisted living facility.  Allergies: No Known Allergies  Outpatient Meds: Current Outpatient Medications  Medication Sig Dispense Refill  . acetaminophen (TYLENOL) 650 MG CR tablet Take 2 tablets (1,300 mg total) by mouth every 8 (eight) hours as needed for pain. take 2 tabs every 6 hours as needed for pain 30 tablet prn  . Enalapril-hydroCHLOROthiazide 5-12.5 MG tablet Take 1 tablet by mouth daily. 90 tablet 0  . ferrous sulfate 325 (65 FE) MG tablet Take 325 mg by mouth daily with breakfast.    . ibuprofen (ADVIL,MOTRIN) 200 MG tablet Take 800 mg by mouth every 6 (six) hours as needed for pain.    . metoprolol succinate (TOPROL-XL) 50 MG 24 hr tablet TAKE 1 TABLET DAILY WITH OR IMMEDIATELY FOLLOWING A MEAL 90 tablet 0  . polyethylene glycol powder (GLYCOLAX/MIRALAX) powder Use 17 g twice a day for 2 days, then 17 g or less, daily until stools are soft but not loose. 3350 g 1   No current facility-administered medications for this visit.       ___________________________________________________________________ Objective   Exam:  BP 108/74 (BP Location: Left Arm, Patient Position: Sitting, Cuff Size: Large)   Pulse 76   Ht 5' 0.5" (1.537 m) Comment:  height measured without shoes  Wt 205 lb 2 oz (93 kg)   LMP 01/06/2018   BMI 39.40 kg/m    General: Well-appearing  Eyes: sclera anicteric, no redness  ENT: oral mucosa moist without lesions, no cervical or supraclavicular lymphadenopathy, good dentition  CV: RRR without murmur, S1/S2, no JVD, no peripheral edema  Resp: clear to auscultation bilaterally, normal RR and effort noted  GI: soft, no tenderness, with active bowel sounds. No guarding or palpable organomegaly noted.  Skin; warm and dry, no rash or jaundice noted  Neuro:  awake, alert and oriented x 3. Normal gross motor function and fluent speech  Labs:  CBC Latest Ref Rng & Units 11/25/2017 02/25/2017 06/07/2016  WBC 3.4 - 10.8 x10E3/uL 5.4 6.1 5.3  Hemoglobin 11.1 - 15.9 g/dL 8.9(L) 10.0(L) 10.0(L)  Hematocrit 34.0 - 46.6 % 29.8(L) 33.6(L) 33.9(L)  Platelets 150 - 379 x10E3/uL 417(H) 314 360   11/25/17:  Iron 19, TIBC 443, Ferritin 10  Ferritin 70 in 2010; 34 in 2008   Assessment: Encounter Diagnoses  Name Primary?  . Iron deficiency anemia due to chronic blood loss Yes  . Heme positive stool     Difficult to know if the reported heme positive stool is related to the anemia, since it seems to have been a false positive result during this work-up in 2010.  Nevertheless, I explained to her how he must assume it could be related, indicating a source of GI blood loss that could be anywhere in the GI tract.  As such, I advised her to have upper endoscopy and colonoscopy.  She is agreeable after discussion of procedure and risks.  Regardless of findings, I strongly encouraged her to consult with her gynecologist for treatment options of menorrhagia.  Plan:  EGD and colonoscopy scheduled.  The benefits and risks of the planned procedure were described in detail with the patient or (when appropriate) their health care proxy.  Risks were outlined as including, but not limited to, bleeding, infection, perforation, adverse  medication reaction leading to cardiac or pulmonary decompensation, or pancreatitis (if ERCP).  The limitation of incomplete mucosal visualization was also discussed.  No guarantees or warranties were given.  CBC and iron studies today.  Thank you for the courtesy of this consult.  Please call me with any questions or concerns.  Nelida Meuse III  CC: Leeanne Rio, MD

## 2018-01-28 ENCOUNTER — Encounter: Payer: Self-pay | Admitting: Family Medicine

## 2018-02-11 ENCOUNTER — Encounter (HOSPITAL_COMMUNITY): Payer: Managed Care, Other (non HMO)

## 2018-02-20 ENCOUNTER — Ambulatory Visit: Payer: Managed Care, Other (non HMO) | Admitting: Family Medicine

## 2018-03-03 ENCOUNTER — Telehealth: Payer: Self-pay | Admitting: Gastroenterology

## 2018-03-03 NOTE — Telephone Encounter (Signed)
Adrienne Collins, can you help this patient with her request for new instructions, please. I appreciate it very much.

## 2018-03-03 NOTE — Telephone Encounter (Signed)
Printed new instructions. Left in file at front desk. Left a voicemail to inform pt.

## 2018-03-04 ENCOUNTER — Encounter: Payer: Self-pay | Admitting: Gastroenterology

## 2018-03-05 ENCOUNTER — Encounter: Payer: Managed Care, Other (non HMO) | Admitting: Gastroenterology

## 2018-03-11 ENCOUNTER — Ambulatory Visit (AMBULATORY_SURGERY_CENTER): Payer: Managed Care, Other (non HMO) | Admitting: Gastroenterology

## 2018-03-11 ENCOUNTER — Encounter: Payer: Self-pay | Admitting: Gastroenterology

## 2018-03-11 VITALS — BP 101/66 | HR 75 | Temp 98.7°F | Resp 16 | Ht 60.0 in | Wt 205.0 lb

## 2018-03-11 DIAGNOSIS — R195 Other fecal abnormalities: Secondary | ICD-10-CM | POA: Diagnosis not present

## 2018-03-11 DIAGNOSIS — D122 Benign neoplasm of ascending colon: Secondary | ICD-10-CM

## 2018-03-11 DIAGNOSIS — K5730 Diverticulosis of large intestine without perforation or abscess without bleeding: Secondary | ICD-10-CM | POA: Diagnosis not present

## 2018-03-11 DIAGNOSIS — D500 Iron deficiency anemia secondary to blood loss (chronic): Secondary | ICD-10-CM | POA: Diagnosis not present

## 2018-03-11 DIAGNOSIS — K5140 Inflammatory polyps of colon without complications: Secondary | ICD-10-CM | POA: Diagnosis not present

## 2018-03-11 MED ORDER — SODIUM CHLORIDE 0.9 % IV SOLN: 500.0000 mL | Freq: Once | INTRAVENOUS | Status: DC

## 2018-03-11 NOTE — Progress Notes (Signed)
A/ox3, pleased with MAC, report to RN 

## 2018-03-11 NOTE — Progress Notes (Signed)
Pt's states no medical or surgical changes since previsit or office visit. 

## 2018-03-11 NOTE — Progress Notes (Signed)
Called to room to assist during endoscopic procedure.  Patient ID and intended procedure confirmed with present staff. Received instructions for my participation in the procedure from the performing physician.  

## 2018-03-11 NOTE — Op Note (Signed)
[name] Patient Name: Adrienne Collins Procedure Date: 03/11/2018 2:19 PM MRN: [id number] Endoscopist: Mallie Mussel L. Loletha Carrow , MD Age: 47 Referring MD:  Date of Birth: 10-15-1970 Gender: Female Account #: [id number] Procedure:                Upper GI endoscopy Indications:              Iron deficiency anemia secondary to chronic blood                            loss, Heme positive stool Medicines:                Monitored Anesthesia Care Procedure:                Pre-Anesthesia Assessment:                           - Prior to the procedure, a History and Physical                            was performed, and patient medications and                            allergies were reviewed. The patient's tolerance of                            previous anesthesia was also reviewed. The risks                            and benefits of the procedure and the sedation                            options and risks were discussed with the patient.                            All questions were answered, and informed consent                            was obtained. Prior Anticoagulants: The patient has                            taken no previous anticoagulant or antiplatelet                            agents. ASA Grade Assessment: II - A patient with                            mild systemic disease. After reviewing the risks                            and benefits, the patient was deemed in                            satisfactory condition to undergo the procedure.  After obtaining informed consent, the endoscope was                            passed under direct vision. Throughout the                            procedure, the patient's blood pressure, pulse, and                            oxygen saturations were monitored continuously. The                            Endoscope was introduced through the mouth, and                            advanced to the second part of  duodenum. The upper                            GI endoscopy was accomplished without difficulty.                            The patient tolerated the procedure well. Scope In: Scope Out: Findings:                 The esophagus was normal.                           The stomach was normal.                           The cardia and gastric fundus were normal on                            retroflexion.                           The examined duodenum was normal. Complications:            No immediate complications. Estimated Blood Loss:     Estimated blood loss: none. Impression:               - Normal esophagus.                           - Normal stomach.                           - Normal examined duodenum.                           - No specimens collected. Recommendation:           - Patient has a contact number available for                            emergencies. The signs and symptoms of potential                              delayed complications were discussed with the                            patient. Return to normal activities tomorrow.                            Written discharge instructions were provided to the                            patient.                           - Resume previous diet.                           - Continue present medications.                           - See the other procedure note for documentation of                            additional recommendations. Ashleigh Arya L. Loletha Carrow, MD 03/11/2018 3:08:22 PM This report has been signed electronically.

## 2018-03-11 NOTE — Patient Instructions (Signed)
Handouts given on polyps and diverticulosis.   YOU HAD AN ENDOSCOPIC PROCEDURE TODAY AT THE [facility]:   Refer to the procedure report that was given to you for any specific questions about what was found during the examination.  If the procedure report does not answer your questions, please call your gastroenterologist to clarify.  If you requested that your care partner not be given the details of your procedure findings, then the procedure report has been included in a sealed envelope for you to review at your convenience later.  YOU SHOULD EXPECT: Some feelings of bloating in the abdomen. Passage of more gas than usual.  Walking can help get rid of the air that was put into your GI tract during the procedure and reduce the bloating. If you had a lower endoscopy (such as a colonoscopy or flexible sigmoidoscopy) you may notice spotting of blood in your stool or on the toilet paper. If you underwent a bowel prep for your procedure, you may not have a normal bowel movement for a few days.  Please Note:  You might notice some irritation and congestion in your nose or some drainage.  This is from the oxygen used during your procedure.  There is no need for concern and it should clear up in a day or so.  SYMPTOMS TO REPORT IMMEDIATELY:   Following lower endoscopy (colonoscopy or flexible sigmoidoscopy):  Excessive amounts of blood in the stool  Significant tenderness or worsening of abdominal pains  Swelling of the abdomen that is new, acute  Fever of 100F or higher   Following upper endoscopy (EGD)  Vomiting of blood or coffee ground material  New chest pain or pain under the shoulder blades  Painful or persistently difficult swallowing  New shortness of breath  Fever of 100F or higher  Black, tarry-looking stools  For urgent or emergent issues, a gastroenterologist can be reached at any hour by calling [phone].   DIET:  We do recommend a small meal at first, but  then you may proceed to your regular diet.  Drink plenty of fluids but you should avoid alcoholic beverages for 24 hours.  ACTIVITY:  You should plan to take it easy for the rest of today and you should NOT DRIVE or use heavy machinery until tomorrow (because of the sedation medicines used during the test).    FOLLOW UP: Our staff will call the number listed on your records the next business day following your procedure to check on you and address any questions or concerns that you may have regarding the information given to you following your procedure. If we do not reach you, we will leave a message.  However, if you are feeling well and you are not experiencing any problems, there is no need to return our call.  We will assume that you have returned to your regular daily activities without incident.  If any biopsies were taken you will be contacted by phone or by letter within the next 1-3 weeks.  Please call us at [phone] if you have not heard about the biopsies in 3 weeks.    SIGNATURES/CONFIDENTIALITY: You and/or your care partner have signed paperwork which will be entered into your electronic medical record.  These signatures attest to the fact that that the information above on your After Visit Summary has been reviewed and is understood.  Full responsibility of the confidentiality of this discharge information lies with you and/or your care-partner. 

## 2018-03-11 NOTE — Op Note (Signed)
[facility] Patient Name: Adrienne Collins Procedure Date: 03/11/2018 2:19 PM MRN: [id number] Endoscopist: Mallie Mussel L. Loletha Carrow , MD Age: 47 Referring MD:  Date of Birth: 12-22-1970 Gender: Female Account #: [id number] Procedure:                Colonoscopy Indications:              Heme positive stool, Iron deficiency anemia                            secondary to chronic blood loss Medicines:                Monitored Anesthesia Care Procedure:                Pre-Anesthesia Assessment:                           - Prior to the procedure, a History and Physical                            was performed, and patient medications and                            allergies were reviewed. The patient's tolerance of                            previous anesthesia was also reviewed. The risks                            and benefits of the procedure and the sedation                            options and risks were discussed with the patient.                            All questions were answered, and informed consent                            was obtained. Prior Anticoagulants: The patient has                            taken no previous anticoagulant or antiplatelet                            agents. ASA Grade Assessment: II - A patient with                            mild systemic disease. After reviewing the risks                            and benefits, the patient was deemed in                            satisfactory condition to undergo the procedure.  After obtaining informed consent, the colonoscope                            was passed under direct vision. Throughout the                            procedure, the patient's blood pressure, pulse, and                            oxygen saturations were monitored continuously. The                            Colonoscope was introduced through the anus and                            advanced to the the terminal ileum,  with                            identification of the appendiceal orifice and IC                            valve. The colonoscopy was performed without                            difficulty. The patient tolerated the procedure                            well. The quality of the bowel preparation was                            good. The ileocecal valve, appendiceal orifice, and                            rectum were photographed. The quality of the bowel                            preparation was evaluated using the BBPS [facility]                            Bowel Preparation Scale) with scores of: Right                            Colon = 2, Transverse Colon = 2 and Left Colon = 2.                            The total BBPS score equals 6. Scope In: 2:48:20 PM Scope Out: 3:04:54 PM Scope Withdrawal Time: 0 hours 13 minutes 54 seconds  Total Procedure Duration: 0 hours 16 minutes 34 seconds  Findings:                 The perianal and digital rectal examinations were                            normal.  The terminal ileum appeared normal.                           A 4 mm polyp was found in the proximal ascending                            colon. The polyp was semi-pedunculated. The polyp                            was removed with a hot snare. Resection and                            retrieval were complete.                           A few diverticula were found in the left colon and                            right colon.                           The exam was otherwise without abnormality on                            direct and retroflexion views. Complications:            No immediate complications. Estimated Blood Loss:     Estimated blood loss: none. Impression:               - The examined portion of the ileum was normal.                           - One 4 mm polyp in the proximal ascending colon,                            removed with a hot snare. Resected and  retrieved.                           - Diverticulosis in the left colon and in the right                            colon.                           - The examination was otherwise normal on direct                            and retroflexion views.                           No cause for heme positive stool or iron deficiency                            anemia was seen. The patient's long-standing  menorrhagia appears to be the most likely                            explanation for anemia. Recommendation:           - Patient has a contact number available for                            emergencies. The signs and symptoms of potential                            delayed complications were discussed with the                            patient. Return to normal activities tomorrow.                            Written discharge instructions were provided to the                            patient.                           - Resume previous diet.                           - Continue present medications.                           - Await pathology results.                           - Repeat colonoscopy is recommended for                            surveillance. The colonoscopy date will be                            determined after pathology results from today's                            exam become available for review.                           - The patient was advised to seek gynecologic                            evalaution for bleeding. If iron deficiency                            persists despite that treatment and adequate iron                            replacement, refer back to GI. Sahid Borba L. Loletha Carrow, MD 03/11/2018 3:13:38 PM This report has been signed electronically.

## 2018-03-12 ENCOUNTER — Telehealth: Payer: Self-pay

## 2018-03-12 NOTE — Telephone Encounter (Signed)
  Follow up Call-  Call back number 03/11/2018  Post procedure Call Back phone  # [phone]  Permission to leave phone message Yes  Some recent data might be hidden     Patient questions:  Do you have a fever, pain , or abdominal swelling? No. Pain Score  0 *  Have you tolerated food without any problems? Yes.    Have you been able to return to your normal activities? Yes.    Do you have any questions about your discharge instructions: Diet   No. Medications  No. Follow up visit  No.  Do you have questions or concerns about your Care? No.  Actions: * If pain score is 4 or above: No action needed, pain <4.

## 2018-03-17 ENCOUNTER — Other Ambulatory Visit: Payer: Self-pay | Admitting: Family Medicine

## 2018-03-19 ENCOUNTER — Encounter: Payer: Self-pay | Admitting: Gastroenterology

## 2018-04-20 ENCOUNTER — Other Ambulatory Visit: Payer: Self-pay | Admitting: Family Medicine

## 2018-07-19 ENCOUNTER — Other Ambulatory Visit: Payer: Self-pay | Admitting: Family Medicine

## 2018-07-23 NOTE — Telephone Encounter (Signed)
Please ask patient to schedule a follow up visit with me, thanks! Brittany J McIntyre, MD  

## 2018-07-24 NOTE — Telephone Encounter (Signed)
Pt informed and scheduled for an appt. Jace Fermin, CMA  

## 2018-08-17 ENCOUNTER — Ambulatory Visit (INDEPENDENT_AMBULATORY_CARE_PROVIDER_SITE_OTHER): Payer: Managed Care, Other (non HMO) | Admitting: Family Medicine

## 2018-08-17 VITALS — BP 111/65 | HR 65 | Temp 98.9°F | Wt 208.8 lb

## 2018-08-17 DIAGNOSIS — G5603 Carpal tunnel syndrome, bilateral upper limbs: Secondary | ICD-10-CM | POA: Diagnosis not present

## 2018-08-17 DIAGNOSIS — D509 Iron deficiency anemia, unspecified: Secondary | ICD-10-CM | POA: Diagnosis not present

## 2018-08-17 DIAGNOSIS — M7540 Impingement syndrome of unspecified shoulder: Secondary | ICD-10-CM

## 2018-08-17 DIAGNOSIS — Z23 Encounter for immunization: Secondary | ICD-10-CM | POA: Diagnosis not present

## 2018-08-17 DIAGNOSIS — R7303 Prediabetes: Secondary | ICD-10-CM

## 2018-08-17 DIAGNOSIS — I10 Essential (primary) hypertension: Secondary | ICD-10-CM | POA: Insufficient documentation

## 2018-08-17 HISTORY — DX: Impingement syndrome of unspecified shoulder: M75.40

## 2018-08-17 LAB — POCT GLYCOSYLATED HEMOGLOBIN (HGB A1C): Hemoglobin A1C: 6.3 % — AB (ref 4.0–5.6)

## 2018-08-17 MED ORDER — WRIST SPLINT/COCK-UP/LEFT M MISC: 0 refills | Status: DC

## 2018-08-17 MED ORDER — WRIST SPLINT/COCK-UP/RIGHT M MISC: 0 refills | Status: DC

## 2018-08-17 NOTE — Assessment & Plan Note (Signed)
Encouraged compliance with iron Check ferritin & CBC today Pelvic ultrasound to assess pelvic anatomy/menorrhagia as cause for IDA

## 2018-08-17 NOTE — Assessment & Plan Note (Signed)
Well-controlled.  Continue current regimen. 

## 2018-08-17 NOTE — Assessment & Plan Note (Addendum)
rx for bilateral cockup splints Ibuprofen as needed  Follow up in 6 wks to eval progress

## 2018-08-17 NOTE — Patient Instructions (Addendum)
Follow up in 6 weeks  Get pelvic ultrasound Wear splints on wrists Flu shot today Checking labs today Do exercises below  Shoulder Impingement Syndrome Rehab Ask your health care provider which exercises are safe for you. Do exercises exactly as told by your health care provider and adjust them as directed. It is normal to feel mild stretching, pulling, tightness, or discomfort as you do these exercises, but you should stop right away if you feel sudden pain or your pain gets worse.Do not begin these exercises until told by your health care provider. Stretching and range of motion exercise This exercise warms up your muscles and joints and improves the movement and flexibility of your shoulder. This exercise also helps to relieve pain and stiffness. Exercise A: Passive horizontal adduction  1. Sit or stand and pull your left / right elbow across your chest, toward your other shoulder. Stop when you feel a gentle stretch in the back of your shoulder and upper arm. ? Keep your arm at shoulder height. ? Keep your arm as close to your body as you comfortably can. 2. Hold for __________ seconds. 3. Slowly return to the starting position. Repeat __________ times. Complete this exercise __________ times a day. Strengthening exercises These exercises build strength and endurance in your shoulder. Endurance is the ability to use your muscles for a long time, even after they get tired. Exercise B: External rotation, isometric 1. Stand or sit in a doorway, facing the door frame. 2. Bend your left / right elbow and place the back of your wrist against the door frame. Only your wrist should be touching the frame. Keep your upper arm at your side. 3. Gently press your wrist against the door frame, as if you are trying to push your arm away from your abdomen. ? Avoid shrugging your shoulder while you press your hand against the door frame. Keep your shoulder blade tucked down toward the middle of your  back. 4. Hold for __________ seconds. 5. Slowly release the tension, and relax your muscles completely before you do the exercise again. Repeat __________ times. Complete this exercise __________ times a day. Exercise C: Internal rotation, isometric  1. Stand or sit in a doorway, facing the door frame. 2. Bend your left / right elbow and place the inside of your wrist against the door frame. Only your wrist should be touching the frame. Keep your upper arm at your side. 3. Gently press your wrist against the door frame, as if you are trying to push your arm toward your abdomen. ? Avoid shrugging your shoulder while you press your hand against the door frame. Keep your shoulder blade tucked down toward the middle of your back. 4. Hold for __________ seconds. 5. Slowly release the tension, and relax your muscles completely before you do the exercise again. Repeat __________ times. Complete this exercise __________ times a day. Exercise D: Scapular protraction, supine  1. Lie on your back on a firm surface. Hold a __________ weight in your left / right hand. 2. Raise your left / right arm straight into the air so your hand is directly above your shoulder joint. 3. Push the weight into the air so your shoulder lifts off of the surface that you are lying on. Do not move your head, neck, or back. 4. Hold for __________ seconds. 5. Slowly return to the starting position. Let your muscles relax completely before you repeat this exercise. Repeat __________ times. Complete this exercise __________ times a day.  Exercise E: Scapular retraction  1. Sit in a stable chair without armrests, or stand. 2. Secure an exercise band to a stable object in front of you so the band is at shoulder height. 3. Hold one end of the exercise band in each hand. Your palms should face down. 4. Squeeze your shoulder blades together and move your elbows slightly behind you. Do not shrug your shoulders while you do  this. 5. Hold for __________ seconds. 6. Slowly return to the starting position. Repeat __________ times. Complete this exercise __________ times a day. Exercise F: Shoulder extension  1. Sit in a stable chair without armrests, or stand. 2. Secure an exercise band to a stable object in front of you where the band is above shoulder height. 3. Hold one end of the exercise band in each hand. 4. Straighten your elbows and lift your hands up to shoulder height. 5. Squeeze your shoulder blades together and pull your hands down to the sides of your thighs. Stop when your hands are straight down by your sides. Do not let your hands go behind your body. 6. Hold for __________ seconds. 7. Slowly return to the starting position. Repeat __________ times. Complete this exercise __________ times a day. This information is not intended to replace advice given to you by your health care provider. Make sure you discuss any questions you have with your health care provider. Document Released: 07/29/2005 Document Revised: 04/04/2016 Document Reviewed: 07/01/2015 Elsevier Interactive Patient Education  2019 [employer].

## 2018-08-17 NOTE — Progress Notes (Signed)
Date of Visit: 08/17/2018   HPI:  Patient presents for follow up, also to discuss the following:  Anemia - not taking iron regularly. Forgets to take it. Has 7-8 day periods. 4 pads per day while on period. No chest pain or shortness of breath. No intermenstrual bleeding. Had EGD & colonoscopy without source of bleeding.  Hypertension - currently taking enalapril-HCTZ 5-12.5mg  daily. Tolerating well.   Bilateral shoulder pain - for >2 years. Never had xrays. Primarily issue when reaching over her head. No specific injuries  Fingers tingling - third-fifth fingers numb/tingling on both hands for last few weeks, especially on L side.  ROS: See HPI.  [place]: history of anemia, hypertension, LVH, OSA, prediabetes  PHYSICAL EXAM: BP 111/65   Pulse 65   Temp 98.9 F (37.2 C) (Oral)   Wt 208 lb 12.8 oz (94.7 kg)   SpO2 98%   BMI 40.78 kg/m  Gen: no acute distress, pleasant, cooperative, well appearing HEENT: normocephalic, atraumatic, moist mucous membranes  Heart: regular rate and rhythm, no murmur Lungs: clear to auscultation bilaterally, normal work of breathing  Neuro: alert, grossly nonfocal, speech normal Ext: grip 5/5 bilaterally. No thenar atrophy. Neg tinel & phalens. Pain with active shoulder abduction bilaterally, especially on L. Full ROM of bilateral shoulders. Full strength with shoulder abduction & adduction.  ASSESSMENT/PLAN:  Health maintenance:  -flu shot given today  Anemia, iron deficiency Encouraged compliance with iron Check ferritin & CBC today Pelvic ultrasound to assess pelvic anatomy/menorrhagia as cause for IDA  Carpal tunnel syndrome rx for bilateral cockup splints Ibuprofen as needed  Follow up in 6 wks to eval progress  Rotator cuff impingement syndrome Symptoms consistent with rotator cuff impingement. Given handout with exercises. Ok to use as needed ibuprofen. Follow up 6 wks to eval progress.  Prediabetes Update A1c  today  Hypertension Well controlled. Continue current regimen.   FOLLOW UP: Follow up in 6 wks for wrist & shoulder pain  [place] J. Ardelia Mems, [place]

## 2018-08-17 NOTE — Assessment & Plan Note (Signed)
Update A1c today 

## 2018-08-17 NOTE — Assessment & Plan Note (Addendum)
Symptoms consistent with rotator cuff impingement. Given handout with exercises. Ok to use as needed ibuprofen. Follow up 6 wks to eval progress.

## 2018-08-18 LAB — CBC
Hematocrit: 29.6 % — ABNORMAL LOW (ref 34.0–46.6)
Hemoglobin: 9 g/dL — ABNORMAL LOW (ref 11.1–15.9)
MCH: 21.8 pg — ABNORMAL LOW (ref 26.6–33.0)
MCHC: 30.4 g/dL — AB (ref 31.5–35.7)
MCV: 72 fL — ABNORMAL LOW (ref 79–97)
Platelets: 394 10*3/uL (ref 150–450)
RBC: 4.12 x10E6/uL (ref 3.77–5.28)
RDW: 16.6 % — ABNORMAL HIGH (ref 11.7–15.4)
WBC: 5.9 10*3/uL (ref 3.4–10.8)

## 2018-08-18 LAB — FERRITIN: Ferritin: 10 ng/mL — ABNORMAL LOW (ref 15–150)

## 2018-08-27 ENCOUNTER — Telehealth: Payer: Self-pay | Admitting: Family Medicine

## 2018-08-27 NOTE — Telephone Encounter (Signed)
Called patient to discuss labs. She is still iron deficient, recommended compliance with iron. She had already reviewed the labs and knows to take iron.  Also discussed A1c of 6.3. She is working on losing weight & has already cut back on carbs.  She has not yet heard about an ultrasound appointment. Red team, can you schedule pelvic ultrasound & let her know of the appointment time?  Thanks! Leeanne Rio, MD

## 2018-08-31 NOTE — Telephone Encounter (Signed)
LMOVM with appt time [facility] Pt must arrive at 9:15 with a full bladder. If she needs to reschedule she can call [phone]. Deseree Kennon Holter, CMA

## 2018-09-04 ENCOUNTER — Ambulatory Visit (HOSPITAL_COMMUNITY)
Admission: RE | Admit: 2018-09-04 | Discharge: 2018-09-04 | Disposition: A | Discharge status: Home / Self Care | Payer: Managed Care, Other (non HMO) | Source: Ambulatory Visit | Attending: Family Medicine | Admitting: Family Medicine

## 2018-09-04 DIAGNOSIS — D509 Iron deficiency anemia, unspecified: Secondary | ICD-10-CM | POA: Diagnosis not present

## 2018-09-04 IMAGING — US US PELVIS COMPLETE WITH TRANSVAGINAL
1 series · 15 of 25 positions shown · non-contrast
Comparison: None

CLINICAL DATA: Menorrhagia.  Iron deficiency anemia.

EXAM:
TRANSABDOMINAL AND TRANSVAGINAL ULTRASOUND OF PELVIS
TECHNIQUE: Both transabdominal and transvaginal ultrasound examinations of the
pelvis were performed. Transabdominal technique was performed for
global imaging of the pelvis including uterus, ovaries, adnexal
regions, and pelvic cul-de-sac. It was necessary to proceed with
endovaginal exam following the transabdominal exam to visualize the
endometrium and ovaries.

[Series 1: us pelvis complete with transvaginal · 15 of 98 slices shown]
[im 1/98]
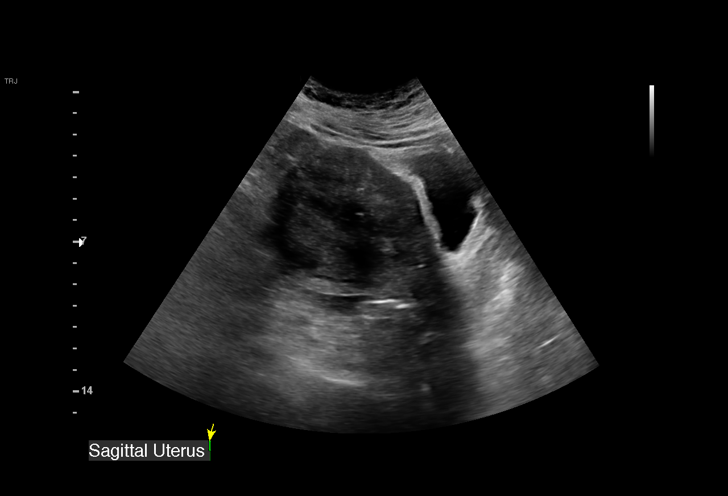
[im 9/98]
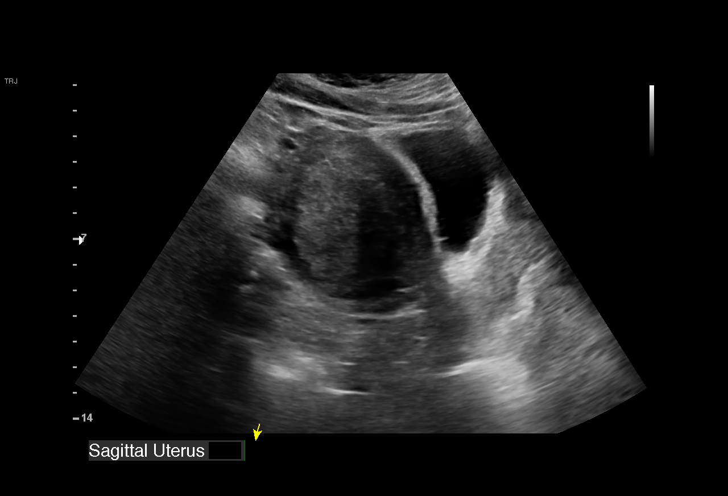
[im 17/98]
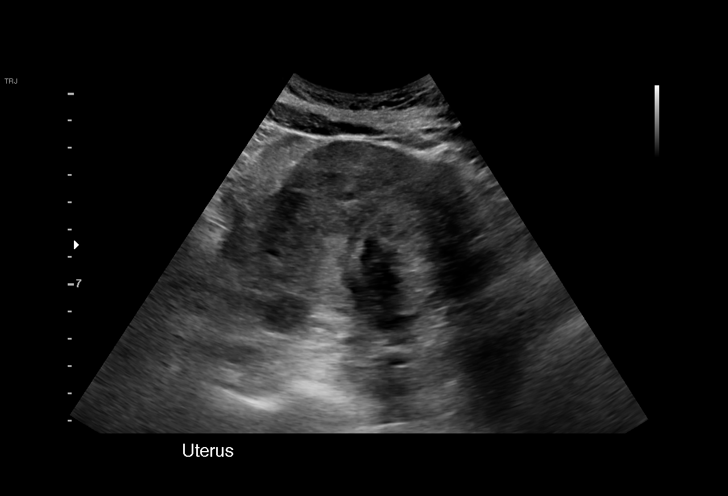
[im 21/98]
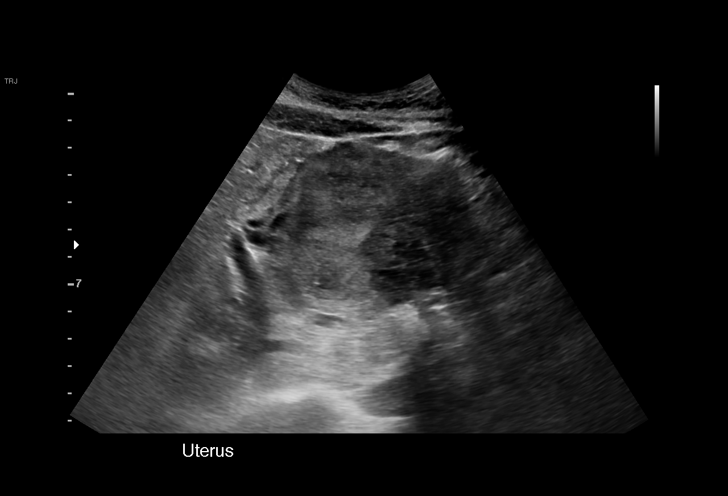
[im 29/98]
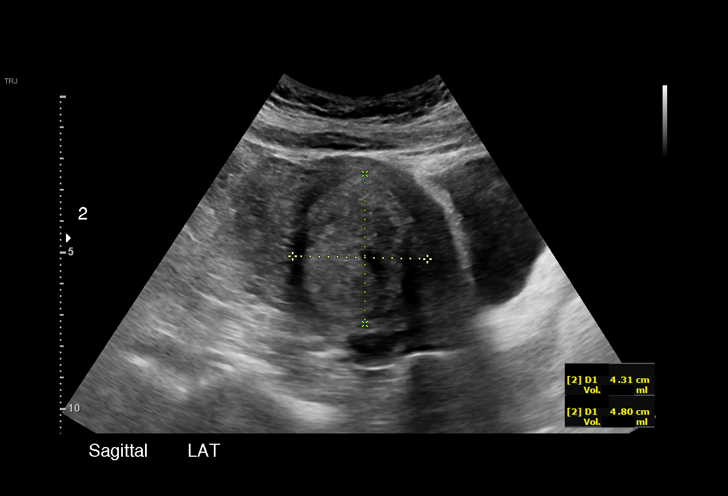
[im 37/98]
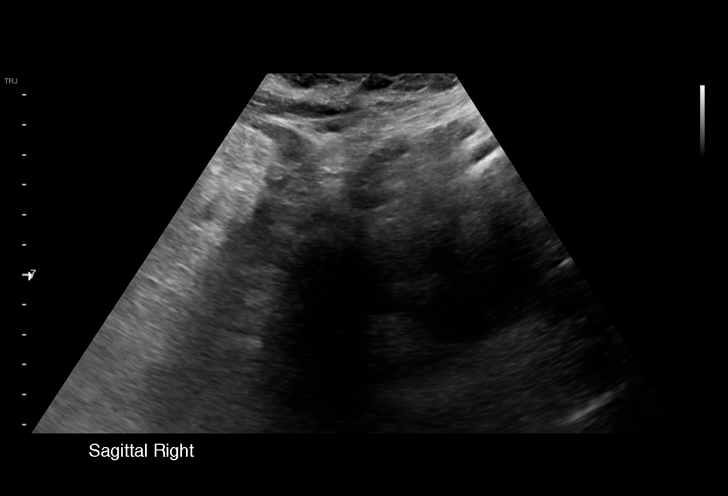
[im 41/98]
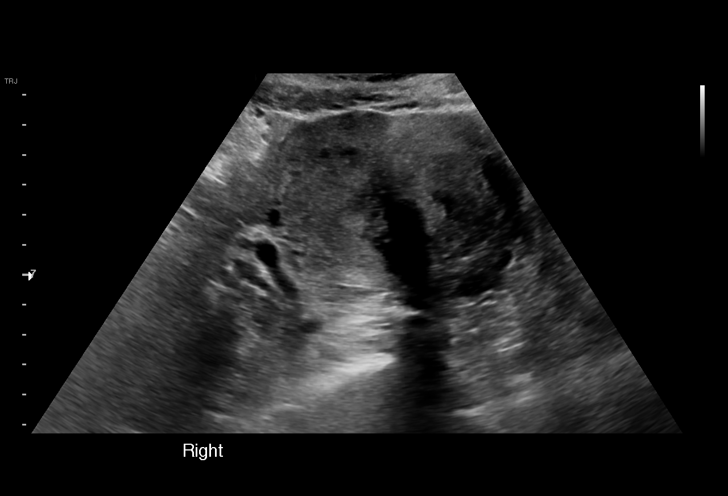
[im 49/98]
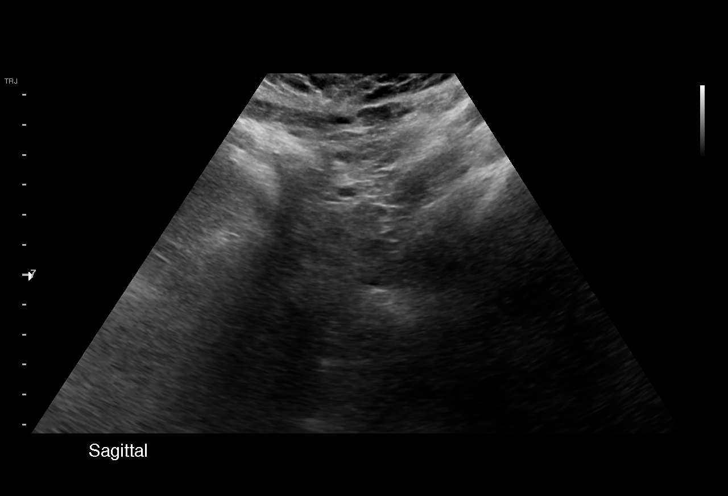
[im 57/98]
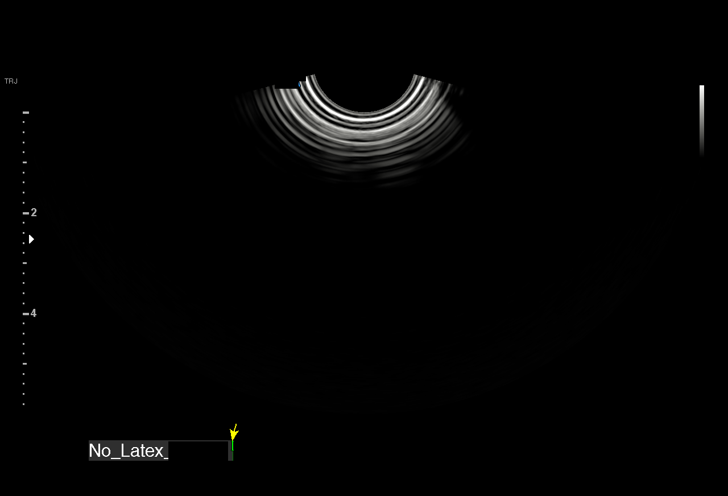
[im 61/98]
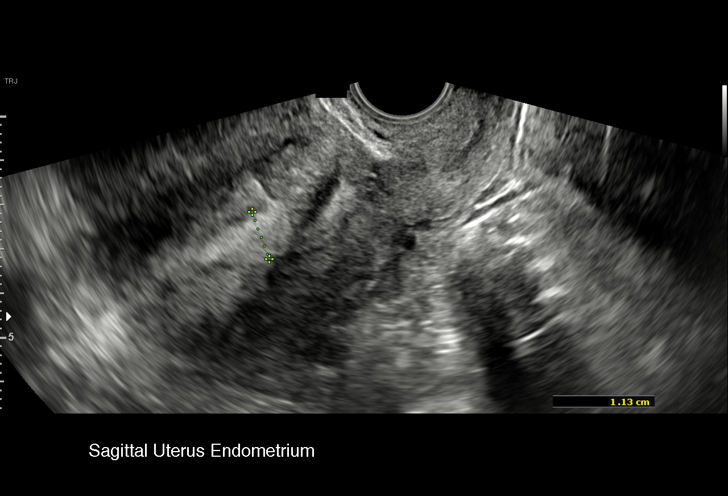
[im 69/98]
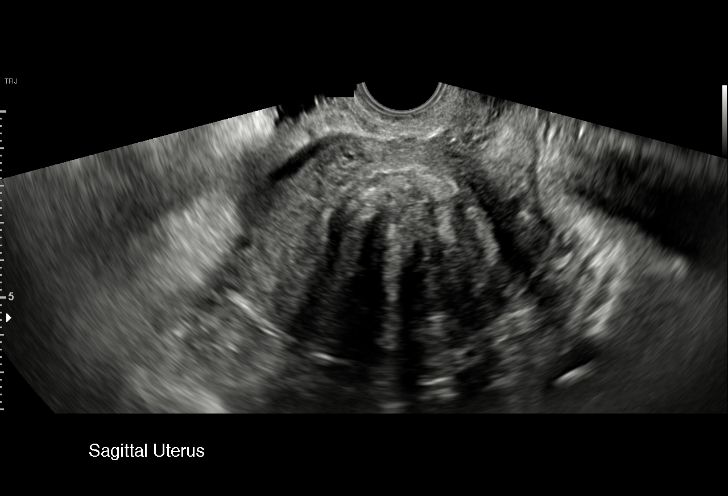
[im 77/98]
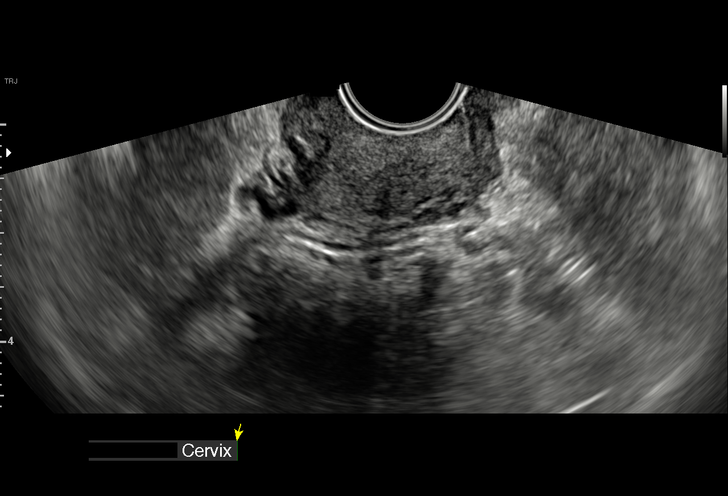
[im 81/98]
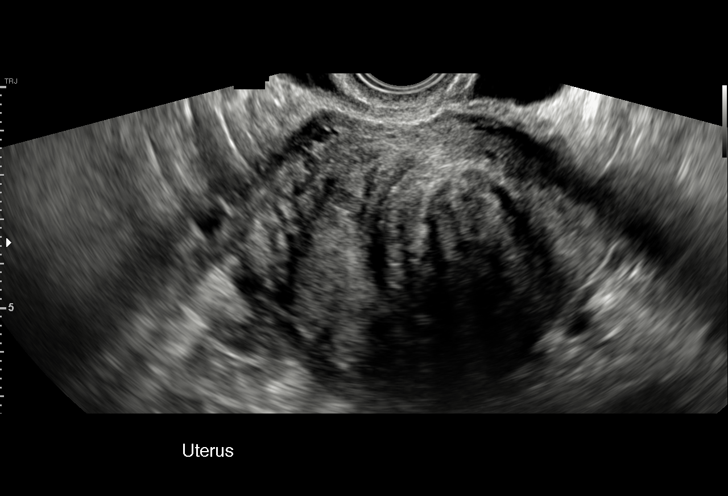
[im 89/98]
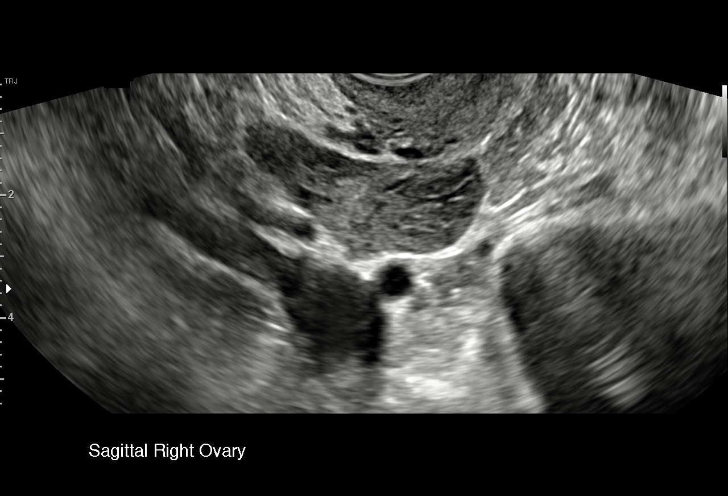
[im 98/98]
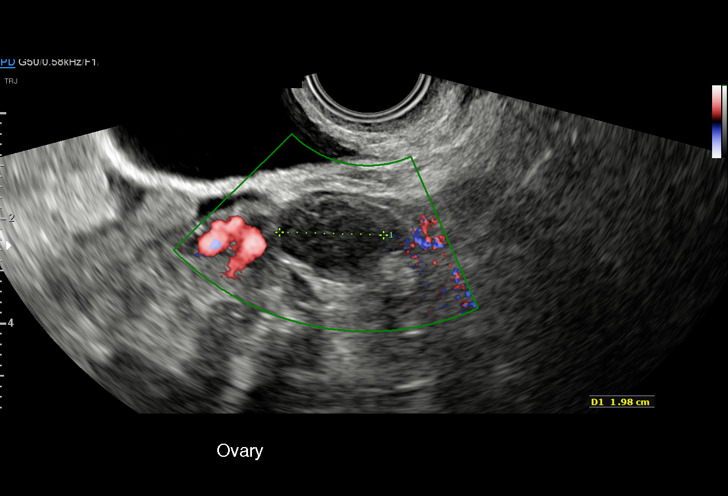

[15 of 25 positions shown; findings below may reference images not displayed]

FINDINGS: Uterus

Measurements: 11.4 x 6.7 x 7.3 cm = volume: 294 mL. At least 3
separate uterine fibroids are seen. A subserosal fibroid is located
in the right anterior corpus measuring 5.1 cm. Another fibroid in
the left lateral corpus measures 5.1 cm, with submucosal component
displacing the endometrium to the right.

Endometrium

Thickness: 11 mm.  No focal abnormality visualized.

Right ovary

Measurements: 3.0 x 1.6 x 2.6 cm = volume: 6.5 mL. Normal
appearance/no adnexal mass.

Left ovary

Measurements: 2.3 x 1.6 x 2.3 cm = volume: 4.3 mL. Normal
appearance/no adnexal mass.

Other findings

No abnormal free fluid.
IMPRESSION: Several uterine fibroids, largest measuring 5.1 cm which is
submucosal in location.

Normal appearance of both ovaries.  No adnexal mass identified.

## 2018-09-10 ENCOUNTER — Encounter: Payer: Self-pay | Admitting: Family Medicine

## 2018-09-13 ENCOUNTER — Other Ambulatory Visit: Payer: Self-pay | Admitting: Family Medicine

## 2018-10-23 ENCOUNTER — Other Ambulatory Visit: Payer: Self-pay | Admitting: Family Medicine

## 2019-05-03 ENCOUNTER — Ambulatory Visit (INDEPENDENT_AMBULATORY_CARE_PROVIDER_SITE_OTHER): Payer: Managed Care, Other (non HMO) | Admitting: Family Medicine

## 2019-05-03 ENCOUNTER — Other Ambulatory Visit: Payer: Self-pay

## 2019-05-03 VITALS — BP 122/75 | HR 72 | Temp 97.6°F | Wt 207.6 lb

## 2019-05-03 DIAGNOSIS — D259 Leiomyoma of uterus, unspecified: Secondary | ICD-10-CM | POA: Diagnosis not present

## 2019-05-03 DIAGNOSIS — Z23 Encounter for immunization: Secondary | ICD-10-CM

## 2019-05-03 DIAGNOSIS — D509 Iron deficiency anemia, unspecified: Secondary | ICD-10-CM | POA: Diagnosis not present

## 2019-05-03 MED ORDER — CYCLOBENZAPRINE HCL 5 MG PO TABS: 5.0000 mg | ORAL_TABLET | Freq: Three times a day (TID) | ORAL | 0 refills | Status: DC | PRN

## 2019-05-03 NOTE — Assessment & Plan Note (Addendum)
Update CBC and iron studies today to assess control. Anticipate will be uncontrolled given noncompliance with oral iron. Refer to GYN for management of fibroids/menorrhagia.

## 2019-05-03 NOTE — Progress Notes (Signed)
Date of Visit: 05/03/2019   HPI:  Patient presents for a same day appointment to discuss back spasms.  Back spasms - began on Thursday of last week, worse on SAturday/ Sunday. No preceding injuries or falls. Pain in R upper back. Feels spasm in muscle. Worse with any movement. Denies having fever, saddle anesthesia, lower extremity weakness, or problems with stooling or urination. Tried advil and aleve. Ibuprofen helped take the edge off some, aleve did not help at all. Tried icy hot also without improvement. Stayed home from work today because pain was so severe with movement. Wants to stay out of work for another couple days.  Anemia - has not been taking iron consistently at home. Had u/s done which showed fibroids. Agreeable to seeing GYN to discuss treatment options. She is not interested in birth control pills. Thinks she is done childbearing. Does admit to some restless leg type symptoms at night and thinks it could be related to anemia. No shortness of breath.   ROS: See HPI  [place]: history of iron deficiency anemia, menorrhagia, morbid obesity  PHYSICAL EXAM: BP 122/75   Pulse 72   Temp 97.6 F (36.4 C)   Wt 207 lb 9.6 oz (94.2 kg)   SpO2 99%   BMI 40.54 kg/m  Gen: no acute distress, pleasant, cooperative HEENT: normocephalic, atraumatic  Back: tender to palpation in paraspinal musculature on R upper back Neuro: Gait normal  ASSESSMENT/PLAN:  Back muscle spasm No red flags. Seems muscle spasm Add flexeril, continue ibuprofen Heating pad Follow up if not improving  Anemia, iron deficiency Update CBC and iron studies today to assess control. Anticipate will be uncontrolled given noncompliance with oral iron. Refer to GYN for management of fibroids/menorrhagia.     FOLLOW UP: Follow up as needed if symptoms worsen or do not improve.  Referring to [place] Ardelia Mems, [place]

## 2019-05-03 NOTE — Patient Instructions (Signed)
Take iron daily Checking iron levels today  Out of work for the next 2 days Stay active at home Gentle stretching Heating pad Flexeril - use caution as it may make you sleepy  Come back if not better in a week  Be well, Dr. Ardelia Mems

## 2019-05-04 LAB — IRON AND TIBC
Iron Saturation: 5 % — CL (ref 15–55)
Iron: 24 ug/dL — ABNORMAL LOW (ref 27–159)
Total Iron Binding Capacity: 451 ug/dL — ABNORMAL HIGH (ref 250–450)
UIBC: 427 ug/dL — ABNORMAL HIGH (ref 131–425)

## 2019-05-04 LAB — CBC
Hematocrit: 28.5 % — ABNORMAL LOW (ref 34.0–46.6)
Hemoglobin: 8.2 g/dL — ABNORMAL LOW (ref 11.1–15.9)
MCH: 19.8 pg — ABNORMAL LOW (ref 26.6–33.0)
MCHC: 28.8 g/dL — ABNORMAL LOW (ref 31.5–35.7)
MCV: 69 fL — ABNORMAL LOW (ref 79–97)
Platelets: 428 10*3/uL (ref 150–450)
RBC: 4.15 x10E6/uL (ref 3.77–5.28)
RDW: 16.6 % — ABNORMAL HIGH (ref 11.7–15.4)
WBC: 6.9 10*3/uL (ref 3.4–10.8)

## 2019-05-04 LAB — FERRITIN: Ferritin: 7 ng/mL — ABNORMAL LOW (ref 15–150)

## 2019-05-13 ENCOUNTER — Telehealth: Payer: Self-pay | Admitting: Family Medicine

## 2019-05-13 NOTE — Telephone Encounter (Signed)
Attempted to reach patient to discuss recent labs. No answer. LVM asking for her to call back  Leeanne Rio, MD

## 2019-05-19 NOTE — Telephone Encounter (Signed)
Pt lm on nurse line regarding returned call.  Christen Bame, CMA

## 2019-05-20 ENCOUNTER — Encounter: Payer: Self-pay | Admitting: Family Medicine

## 2019-05-21 NOTE — Telephone Encounter (Signed)
Called patient back and reached her - discussed anemia, need for iron. She has not yet started taking it but says she will commit to it. Scheduled appointment to see me in 1 month to recheck CBC, also to update A1c Patient appreciative Leeanne Rio, MD

## 2019-06-11 ENCOUNTER — Encounter: Payer: Managed Care, Other (non HMO) | Admitting: Obstetrics and Gynecology

## 2019-06-22 ENCOUNTER — Ambulatory Visit: Payer: Managed Care, Other (non HMO) | Admitting: Family Medicine

## 2019-07-15 ENCOUNTER — Encounter: Payer: Managed Care, Other (non HMO) | Admitting: Obstetrics and Gynecology

## 2019-07-15 ENCOUNTER — Encounter: Payer: Self-pay | Admitting: Obstetrics and Gynecology

## 2019-10-16 ENCOUNTER — Other Ambulatory Visit: Payer: Self-pay | Admitting: Family Medicine

## 2019-10-18 NOTE — Telephone Encounter (Signed)
Attempted to call pt but no answer and VM box is full. Will try again later. Qamar Aughenbaugh Kennon Holter, CMA

## 2019-10-18 NOTE — Telephone Encounter (Signed)
Please ask patient to schedule a follow up visit with me.  Thanks! Altair Appenzeller J Eldra Word, MD  

## 2019-11-08 ENCOUNTER — Ambulatory Visit: Payer: Managed Care, Other (non HMO)

## 2019-11-09 ENCOUNTER — Ambulatory Visit: Payer: Managed Care, Other (non HMO) | Admitting: Family Medicine

## 2019-11-15 ENCOUNTER — Ambulatory Visit: Payer: Managed Care, Other (non HMO)

## 2019-11-16 ENCOUNTER — Ambulatory Visit: Payer: Managed Care, Other (non HMO) | Attending: Family

## 2019-11-16 DIAGNOSIS — Z23 Encounter for immunization: Secondary | ICD-10-CM

## 2019-11-16 NOTE — Progress Notes (Signed)
   Covid-19 Vaccination Clinic  Name:  Adrienne Collins    MRN: [id number] DOB: [birth year]  11/16/2019  [name] was observed post Covid-19 immunization for 15 minutes without incident. She was provided with Vaccine Information Sheet and instruction to access the V-Safe system.   [name] was instructed to call 911 with any severe reactions post vaccine: Marland Kitchen Difficulty breathing  . Swelling of face and throat  . A fast heartbeat  . A bad rash all over body  . Dizziness and weakness   Immunizations Administered    Name Date Dose VIS Date Route   Moderna COVID-19 Vaccine 11/16/2019  4:06 PM 0.5 mL 07/13/2019 Intramuscular   Manufacturer: Moderna   Lot: [id number]   [id number]

## 2019-12-12 ENCOUNTER — Other Ambulatory Visit: Payer: Self-pay | Admitting: Family Medicine

## 2019-12-13 ENCOUNTER — Ambulatory Visit: Payer: Managed Care, Other (non HMO) | Admitting: Family Medicine

## 2020-01-03 ENCOUNTER — Ambulatory Visit: Payer: Managed Care, Other (non HMO) | Admitting: Family Medicine

## 2020-01-14 ENCOUNTER — Other Ambulatory Visit: Payer: Self-pay | Admitting: Family Medicine

## 2020-01-31 ENCOUNTER — Ambulatory Visit: Payer: Managed Care, Other (non HMO) | Admitting: Family Medicine

## 2020-03-13 ENCOUNTER — Other Ambulatory Visit: Payer: Self-pay | Admitting: Family Medicine

## 2020-03-13 NOTE — Telephone Encounter (Signed)
Please ask patient to schedule a visit with me. Will not be able to refill again until she comes in for an appointment  Thanks Leeanne Rio, MD

## 2020-03-14 NOTE — Telephone Encounter (Signed)
Sent pt a my chart message. Joandy Burget, CMA  

## 2020-03-28 ENCOUNTER — Encounter: Payer: Self-pay | Admitting: Family Medicine

## 2020-04-13 ENCOUNTER — Other Ambulatory Visit: Payer: Self-pay | Admitting: Family Medicine

## 2020-04-27 ENCOUNTER — Ambulatory Visit: Payer: Managed Care, Other (non HMO) | Admitting: Family Medicine

## 2020-05-08 ENCOUNTER — Ambulatory Visit: Payer: Managed Care, Other (non HMO) | Admitting: Family Medicine

## 2020-05-09 ENCOUNTER — Ambulatory Visit: Payer: Managed Care, Other (non HMO)

## 2020-06-06 ENCOUNTER — Encounter: Payer: Self-pay | Admitting: Family Medicine

## 2020-06-06 ENCOUNTER — Ambulatory Visit: Payer: Managed Care, Other (non HMO) | Admitting: Family Medicine

## 2020-06-06 ENCOUNTER — Other Ambulatory Visit: Payer: Self-pay

## 2020-06-06 VITALS — BP 115/75 | HR 78 | Ht 61.0 in | Wt 203.0 lb

## 2020-06-06 DIAGNOSIS — M546 Pain in thoracic spine: Secondary | ICD-10-CM

## 2020-06-06 DIAGNOSIS — I10 Essential (primary) hypertension: Secondary | ICD-10-CM

## 2020-06-06 DIAGNOSIS — Z1159 Encounter for screening for other viral diseases: Secondary | ICD-10-CM

## 2020-06-06 DIAGNOSIS — R7309 Other abnormal glucose: Secondary | ICD-10-CM | POA: Diagnosis not present

## 2020-06-06 DIAGNOSIS — D509 Iron deficiency anemia, unspecified: Secondary | ICD-10-CM | POA: Diagnosis not present

## 2020-06-06 DIAGNOSIS — Z0000 Encounter for general adult medical examination without abnormal findings: Secondary | ICD-10-CM | POA: Diagnosis not present

## 2020-06-06 DIAGNOSIS — N920 Excessive and frequent menstruation with regular cycle: Secondary | ICD-10-CM

## 2020-06-06 LAB — POCT URINALYSIS DIP (MANUAL ENTRY)
Bilirubin, UA: NEGATIVE
Glucose, UA: NEGATIVE mg/dL
Ketones, POC UA: NEGATIVE mg/dL
Leukocytes, UA: NEGATIVE
Nitrite, UA: NEGATIVE
Protein Ur, POC: 30 mg/dL — AB
Spec Grav, UA: 1.025 (ref 1.010–1.025)
Urobilinogen, UA: 0.2 E.U./dL
pH, UA: 7 (ref 5.0–8.0)

## 2020-06-06 LAB — POCT GLYCOSYLATED HEMOGLOBIN (HGB A1C): Hemoglobin A1C: 5.9 % — AB (ref 4.0–5.6)

## 2020-06-06 LAB — POCT UA - MICROSCOPIC ONLY: RBC, urine, microscopic: >20

## 2020-06-06 NOTE — Assessment & Plan Note (Signed)
Discussed portion control techniques including mindful eating

## 2020-06-06 NOTE — Assessment & Plan Note (Signed)
Well controlled. Continue current medication regimen.  

## 2020-06-06 NOTE — Patient Instructions (Signed)
It was great to see you again today!  Checking labs today including urine  Referring back to GYN  Reschedule your pap smear  Be well, Dr. Ardelia Mems    Health Maintenance, Female Adopting a healthy lifestyle and getting preventive care are important in promoting health and wellness. Ask your health care provider about:  The right schedule for you to have regular tests and exams.  Things you can do on your own to prevent diseases and keep yourself healthy. What should I know about diet, weight, and exercise? Eat a healthy diet   Eat a diet that includes plenty of vegetables, fruits, low-fat dairy products, and lean protein.  Do not eat a lot of foods that are high in solid fats, added sugars, or sodium. Maintain a healthy weight Body mass index (BMI) is used to identify weight problems. It estimates body fat based on height and weight. Your health care provider can help determine your BMI and help you achieve or maintain a healthy weight. Get regular exercise Get regular exercise. This is one of the most important things you can do for your health. Most adults should:  Exercise for at least 150 minutes each week. The exercise should increase your heart rate and make you sweat (moderate-intensity exercise).  Do strengthening exercises at least twice a week. This is in addition to the moderate-intensity exercise.  Spend less time sitting. Even light physical activity can be beneficial. Watch cholesterol and blood lipids Have your blood tested for lipids and cholesterol at 48 years of age, then have this test every 5 years. Have your cholesterol levels checked more often if:  Your lipid or cholesterol levels are high.  You are older than 48 years of age.  You are at high risk for heart disease. What should I know about cancer screening? Depending on your health history and family history, you may need to have cancer screening at various ages. This may include screening  for:  Breast cancer.  Cervical cancer.  Colorectal cancer.  Skin cancer.  Lung cancer. What should I know about heart disease, diabetes, and high blood pressure? Blood pressure and heart disease  High blood pressure causes heart disease and increases the risk of stroke. This is more likely to develop in people who have high blood pressure readings, are of African descent, or are overweight.  Have your blood pressure checked: ? Every 3-5 years if you are 48-63 years of age. ? Every year if you are 48 years old or older. Diabetes Have regular diabetes screenings. This checks your fasting blood sugar level. Have the screening done:  Once every three years after age 35 if you are at a normal weight and have a low risk for diabetes.  More often and at a younger age if you are overweight or have a high risk for diabetes. What should I know about preventing infection? Hepatitis B If you have a higher risk for hepatitis B, you should be screened for this virus. Talk with your health care provider to find out if you are at risk for hepatitis B infection. Hepatitis C Testing is recommended for:  Everyone born from [birth year] through 1964.  Anyone with known risk factors for hepatitis C. Sexually transmitted infections (STIs)  Get screened for STIs, including gonorrhea and chlamydia, if: ? You are sexually active and are younger than 48 years of age. ? You are older than 48 years of age and your health care provider tells you that you are at risk  for this type of infection. ? Your sexual activity has changed since you were last screened, and you are at increased risk for chlamydia or gonorrhea. Ask your health care provider if you are at risk.  Ask your health care provider about whether you are at high risk for HIV. Your health care provider may recommend a prescription medicine to help prevent HIV infection. If you choose to take medicine to prevent HIV, you should first get tested for HIV.  You should then be tested every 3 months for as long as you are taking the medicine. Pregnancy  If you are about to stop having your period (premenopausal) and you may become pregnant, seek counseling before you get pregnant.  Take 400 to 800 micrograms (mcg) of folic acid every day if you become pregnant.  Ask for birth control (contraception) if you want to prevent pregnancy. Osteoporosis and menopause Osteoporosis is a disease in which the bones lose minerals and strength with aging. This can result in bone fractures. If you are 48 years old or older, or if you are at risk for osteoporosis and fractures, ask your health care provider if you should:  Be screened for bone loss.  Take a calcium or vitamin D supplement to lower your risk of fractures.  Be given hormone replacement therapy (HRT) to treat symptoms of menopause. Follow these instructions at home: Lifestyle  Do not use any products that contain nicotine or tobacco, such as cigarettes, e-cigarettes, and chewing tobacco. If you need help quitting, ask your health care provider.  Do not use street drugs.  Do not share needles.  Ask your health care provider for help if you need support or information about quitting drugs. Alcohol use  Do not drink alcohol if: ? Your health care provider tells you not to drink. ? You are pregnant, may be pregnant, or are planning to become pregnant.  If you drink alcohol: ? Limit how much you use to 0-1 drink a day. ? Limit intake if you are breastfeeding.  Be aware of how much alcohol is in your drink. In the [place] one drink equals one 12 oz bottle of beer (355 mL), one 5 oz glass of wine (148 mL), or one 1 oz glass of hard liquor (44 mL). General instructions  Schedule regular health, dental, and eye exams.  Stay current with your vaccines.  Tell your health care provider if: ? You often feel depressed. ? You have ever been abused or do not feel safe at  home. Summary  Adopting a healthy lifestyle and getting preventive care are important in promoting health and wellness.  Follow your health care provider's instructions about healthy diet, exercising, and getting tested or screened for diseases.  Follow your health care provider's instructions on monitoring your cholesterol and blood pressure. This information is not intended to replace advice given to you by your health care provider. Make sure you discuss any questions you have with your health care provider. Document Revised: 07/22/2018 Document Reviewed: 07/22/2018 Elsevier Patient Education  2020 [employer].

## 2020-06-06 NOTE — Assessment & Plan Note (Signed)
Refer back to GYN to discuss treatment options for menorrhagia related to fibroids

## 2020-06-06 NOTE — Assessment & Plan Note (Signed)
Update CBC and iron studies today Encourage compliance with iron Referring back to GYN to discuss treatment options for fibroids

## 2020-06-06 NOTE — Progress Notes (Signed)
  Date of Visit: 06/06/2020   SUBJECTIVE:   HPI:  Adrienne Collins presents today for a well woman visit.  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 Has occurred multiple times since then, lasting about 15-20 mins at a time. Wonders if she could have UTI. Wraps around toward the front. Denies n/v or fevers. Denies having fever, saddle anesthesia, lower extremity weakness, or problems with stooling or urination.   Iron deficiency anemia - taking iron twice daily, but often forgets to take it, has to take with food or it bothers her stomach. Previously referred to GYN but no showed x2, agreeable to going back there again.  Hypertension - taking enalapril-HCTZ 5-12.5 and metoprolol XR 50mg  daily. Tolerating these well.  OBJECTIVE:   BP 115/75   Pulse 78   Ht 5\' 1"  (1.549 m)   Wt 203 lb (92.1 kg)   SpO2 100%   BMI 38.36 kg/m  Gen: no acute distress, pleasant, cooperative HEENT: normocephalic, atraumatic. No anterior cervical or supraclavicular lymphadenopathy.  Heart: regular rate and rhythm, no murmur Lungs: clear to auscultation bilaterally, normal work of breathing  Neuro: alert speech normal Ext: No appreciable lower extremity edema bilaterally  Abdomen: soft, nontender to palpation, no masses or organomegaly, negative murphy's sign Back: mild paraspinal muscle tenderness of R side in thoracic area. No deeper CVA tenderness.  ASSESSMENT/PLAN:   Health maintenance:  -STD screening: declines -pap smear: patient to reschedule for pap as wants  to defer this today due to menses -hep C antibody obtained today -mammogram: current on mammo -lipid screening: check lipids today -colon cancer screening: current on colonoscopy -immunizations:  . Flu: got already . Tdap: current . COVID: current -handout given on health maintenance topics   Morbid obesity ([place]) Discussed portion control techniques including mindful eating  Menorrhagia Refer back to GYN to discuss treatment options for menorrhagia related to fibroids  Hypertension Well controlled. Continue current medication regimen.   Anemia, iron deficiency Update CBC and iron studies today Encourage compliance with iron Referring back to GYN to discuss treatment options for fibroids  Back pain No red flags Suspect muscle strain Tylenol/ibuprofen - patient declined rx for muscle relaxer Check UA, lower suspicion for urinary pathology though  Adrienne Collins, [place]

## 2020-06-07 ENCOUNTER — Ambulatory Visit: Payer: Managed Care, Other (non HMO) | Admitting: Family Medicine

## 2020-06-07 LAB — CBC
Hematocrit: 29.5 % — ABNORMAL LOW (ref 34.0–46.6)
Hemoglobin: 8.5 g/dL — ABNORMAL LOW (ref 11.1–15.9)
MCH: 19.9 pg — ABNORMAL LOW (ref 26.6–33.0)
MCHC: 28.8 g/dL — ABNORMAL LOW (ref 31.5–35.7)
MCV: 69 fL — ABNORMAL LOW (ref 79–97)
Platelets: 350 10*3/uL (ref 150–450)
RBC: 4.27 x10E6/uL (ref 3.77–5.28)
RDW: 22.3 % — ABNORMAL HIGH (ref 11.7–15.4)
WBC: 5.4 10*3/uL (ref 3.4–10.8)

## 2020-06-07 LAB — CMP14+EGFR
ALT: 19 IU/L (ref 0–32)
AST: 19 IU/L (ref 0–40)
Albumin/Globulin Ratio: 1.5 (ref 1.2–2.2)
Albumin: 4.5 g/dL (ref 3.8–4.8)
Alkaline Phosphatase: 98 IU/L (ref 44–121)
BUN/Creatinine Ratio: 17 (ref 9–23)
BUN: 10 mg/dL (ref 6–24)
Bilirubin Total: <0.2 mg/dL (ref 0.0–1.2)
CO2: 20 mmol/L (ref 20–29)
Calcium: 9.1 mg/dL (ref 8.7–10.2)
Chloride: 103 mmol/L (ref 96–106)
Creatinine, Ser: 0.6 mg/dL (ref 0.57–1.00)
GFR calc Af Amer: 125 mL/min/{1.73_m2} (ref >59)
GFR calc non Af Amer: 108 mL/min/{1.73_m2} (ref >59)
Globulin, Total: 3 g/dL (ref 1.5–4.5)
Glucose: 120 mg/dL — ABNORMAL HIGH (ref 65–99)
Potassium: 4.5 mmol/L (ref 3.5–5.2)
Sodium: 137 mmol/L (ref 134–144)
Total Protein: 7.5 g/dL (ref 6.0–8.5)

## 2020-06-07 LAB — IRON AND TIBC
Iron Saturation: 4 % — CL (ref 15–55)
Iron: 19 ug/dL — ABNORMAL LOW (ref 27–159)
Total Iron Binding Capacity: 427 ug/dL (ref 250–450)
UIBC: 408 ug/dL (ref 131–425)

## 2020-06-07 LAB — HCV AB W REFLEX TO QUANT PCR: HCV Ab: <0.1 s/co ratio (ref 0.0–0.9)

## 2020-06-07 LAB — LIPID PANEL
Chol/HDL Ratio: 3.7 ratio (ref 0.0–4.4)
Cholesterol, Total: 161 mg/dL (ref 100–199)
HDL: 44 mg/dL (ref >39)
LDL Chol Calc (NIH): 102 mg/dL — ABNORMAL HIGH (ref 0–99)
Triglycerides: 79 mg/dL (ref 0–149)
VLDL Cholesterol Cal: 15 mg/dL (ref 5–40)

## 2020-06-07 LAB — FERRITIN: Ferritin: 13 ng/mL — ABNORMAL LOW (ref 15–150)

## 2020-06-07 LAB — HCV INTERPRETATION

## 2020-06-08 ENCOUNTER — Other Ambulatory Visit: Payer: Self-pay | Admitting: Family Medicine

## 2020-06-08 DIAGNOSIS — D509 Iron deficiency anemia, unspecified: Secondary | ICD-10-CM

## 2020-06-08 NOTE — Telephone Encounter (Signed)
Red team,  I have ordered an IV iron infusion for patient. Can you contact [facility] at [phone] to schedule the appointment and let patient know about the appointment info?  Thanks! Leeanne Rio, MD

## 2020-06-14 ENCOUNTER — Ambulatory Visit (INDEPENDENT_AMBULATORY_CARE_PROVIDER_SITE_OTHER): Payer: Managed Care, Other (non HMO) | Admitting: Student in an Organized Health Care Education/Training Program

## 2020-06-14 ENCOUNTER — Other Ambulatory Visit: Payer: Self-pay

## 2020-06-14 ENCOUNTER — Other Ambulatory Visit (HOSPITAL_COMMUNITY)
Admission: RE | Admit: 2020-06-14 | Discharge: 2020-06-14 | Disposition: A | Discharge status: Home / Self Care | Payer: Managed Care, Other (non HMO) | Source: Ambulatory Visit | Attending: Family Medicine | Admitting: Family Medicine

## 2020-06-14 ENCOUNTER — Encounter: Payer: Self-pay | Admitting: Student in an Organized Health Care Education/Training Program

## 2020-06-14 VITALS — BP 122/78 | HR 76 | Ht 61.0 in | Wt 205.0 lb

## 2020-06-14 DIAGNOSIS — Z124 Encounter for screening for malignant neoplasm of cervix: Secondary | ICD-10-CM

## 2020-06-14 HISTORY — DX: Encounter for screening for malignant neoplasm of cervix: Z12.4

## 2020-06-14 NOTE — Progress Notes (Signed)
    SUBJECTIVE:   CHIEF COMPLAINT / HPI: cervical cancer screening  Last pap 2016 neg for malignancy or HPV. Presents today for repeat pap without any concerns.  Denies pain, discharge, concern for STDs or abnormal bleeding. Endorses history of uterine fibroids and heavy periods. LMP was about 1 week ago.  OBJECTIVE:   BP 122/78   Pulse 76   Ht 5\' 1"  (1.549 m)   Wt 205 lb (93 kg)   LMP 06/06/2020 (Approximate)   SpO2 98%   BMI 38.73 kg/m   General: NAD, pleasant, able to participate in exam Pelvic: negative external genitalia lesions. Negative discharge. Cervix midline and without lesion. Mild bleeding with cervical os brushing. Extremities: no edema. WWP. Skin: warm and dry, no rashes noted Neuro: alert and oriented, no focal deficits Psych: Normal affect and mood  ASSESSMENT/PLAN:   Cervical cancer screening Pap smear collected today Last pap 2016 was normal.  Will notify patient via mychart with results. Follow up with ob/gyn as directed by PCP for follow up of uterine fibroids.     [place]

## 2020-06-14 NOTE — Assessment & Plan Note (Signed)
Pap smear collected today Last pap 2016 was normal.  Will notify patient via mychart with results. Follow up with ob/gyn as directed by PCP for follow up of uterine fibroids.

## 2020-06-14 NOTE — Patient Instructions (Signed)
It was a pleasure to see you today!  To summarize our discussion for this visit:  We collected a sample for cervical cancer screening. I will message you with your results within a few days.   Some additional health maintenance measures we should update are: Health Maintenance Due  Topic Date Due  . PAP SMEAR-Modifier  05/16/2020  .   Call the clinic at [phone] if your symptoms worsen or you have any concerns.   Thank you for allowing me to take part in your care,  Dr. Doristine Mango   Pap Test Why am I having this test? A Pap test, also called a Pap smear, is a screening test to check for signs of:  Cancer of the vagina, cervix, and uterus. The cervix is the lower part of the uterus that opens into the vagina.  Infection.  Changes that may be a sign that cancer is developing (precancerous changes). Women need this test on a regular basis. In general, you should have a Pap test every 3 years until you reach menopause or age 19. Women aged 30-60 may choose to have their Pap test done at the same time as an HPV (human papillomavirus) test every 5 years (instead of every 3 years). Your health care provider may recommend having Pap tests more or less often depending on your medical conditions and past Pap test results. What kind of sample is taken?  Your health care provider will collect a sample of cells from the surface of your cervix. This will be done using a small cotton swab, plastic spatula, or brush. This sample is often collected during a pelvic exam, when you are lying on your back on an exam table with feet in footrests (stirrups). In some cases, fluids (secretions) from the cervix or vagina may also be collected. How do I prepare for this test?  Be aware of where you are in your menstrual cycle. If you are menstruating on the day of the test, you may be asked to reschedule.  You may need to reschedule if you have a known vaginal infection on the day of the  test.  Follow instructions from your health care provider about: ? Changing or stopping your regular medicines. Some medicines can cause abnormal test results, such as digitalis and tetracycline. ? Avoiding douching or taking a bath the day before or the day of the test. Tell a health care provider about:  Any allergies you have.  All medicines you are taking, including vitamins, herbs, eye drops, creams, and over-the-counter medicines.  Any blood disorders you have.  Any surgeries you have had.  Any medical conditions you have.  Whether you are pregnant or may be pregnant. How are the results reported? Your test results will be reported as either abnormal or normal. A false-positive result can occur. A false positive is incorrect because it means that a condition is present when it is not. A false-negative result can occur. A false negative is incorrect because it means that a condition is not present when it is. What do the results mean? A normal test result means that you do not have signs of cancer of the vagina, cervix, or uterus. An abnormal result may mean that you have:  Cancer. A Pap test by itself is not enough to diagnose cancer. You will have more tests done in this case.  Precancerous changes in your vagina, cervix, or uterus.  Inflammation of the cervix.  An STD (sexually transmitted disease).  A fungal  infection.  A parasite infection. Talk with your health care provider about what your results mean. Questions to ask your health care provider Ask your health care provider, or the department that is doing the test:  When will my results be ready?  How will I get my results?  What are my treatment options?  What other tests do I need?  What are my next steps? Summary  In general, women should have a Pap test every 3 years until they reach menopause or age 35.  Your health care provider will collect a sample of cells from the surface of your cervix.  This will be done using a small cotton swab, plastic spatula, or brush.  In some cases, fluids (secretions) from the cervix or vagina may also be collected. This information is not intended to replace advice given to you by your health care provider. Make sure you discuss any questions you have with your health care provider. Document Revised: 04/07/2017 Document Reviewed: 04/07/2017 Elsevier Patient Education  [place].

## 2020-06-15 ENCOUNTER — Telehealth: Payer: Self-pay | Admitting: Family Medicine

## 2020-06-15 LAB — CYTOLOGY - PAP
Comment: NEGATIVE
Diagnosis: NEGATIVE
High risk HPV: NEGATIVE

## 2020-06-15 NOTE — Telephone Encounter (Signed)
Patient is calling to check on the status of her iron infusion being scheduled. She would like for someone to call her when this has been done.   The best call back number is [phone]

## 2020-06-16 NOTE — Telephone Encounter (Signed)
Called [facility] at [phone] to schedule IV  Iron infusion for patient.  The type and dosage is needed before it can be scheduled.  Adrienne Collins, [name]

## 2020-06-16 NOTE — Telephone Encounter (Signed)
I previously placed orders in Epic already - they are visible under the "Orders Only" encounter dated 06/08/20.  Attempted to call infusion center to see if they could see these orders on their end, but no answer, got VM. Red team, can you try to contact them on Monday about this? I placed the orders as they had instructed me to, so I think they should be able to see them.  The order is for feraheme 510mg  IV once.  Thanks, Leeanne Rio, MD

## 2020-06-19 ENCOUNTER — Other Ambulatory Visit: Payer: Self-pay

## 2020-06-19 NOTE — Telephone Encounter (Signed)
Scheduled patient for:  06/22/2020 0900 [facility]  Called patient and LVM  informing her of appointment.  Ozella Almond, [place]

## 2020-06-20 ENCOUNTER — Telehealth: Payer: Self-pay

## 2020-06-20 NOTE — Telephone Encounter (Signed)
Patient calls nurse line requesting to reschedule iron infusion. Patient reports that she needs an afternoon appointment if possible. Attempted to call infusion center, no answer, LVM to return call to reschedule patient.   Talbot Grumbling, RN

## 2020-06-20 NOTE — Telephone Encounter (Signed)
Opened in error.   Crucita Lacorte C Laquinta Hazell, RN  

## 2020-06-21 NOTE — Discharge Instructions (Signed)

## 2020-06-22 ENCOUNTER — Other Ambulatory Visit: Payer: Self-pay

## 2020-06-22 ENCOUNTER — Ambulatory Visit (HOSPITAL_COMMUNITY)
Admission: RE | Admit: 2020-06-22 | Discharge: 2020-06-22 | Disposition: A | Discharge status: Home / Self Care | Payer: Managed Care, Other (non HMO) | Source: Ambulatory Visit | Attending: Family Medicine | Admitting: Family Medicine

## 2020-06-22 DIAGNOSIS — D509 Iron deficiency anemia, unspecified: Secondary | ICD-10-CM | POA: Insufficient documentation

## 2020-06-22 MED ORDER — SODIUM CHLORIDE 0.9 % IV SOLN
510.0000 mg | Freq: Once | INTRAVENOUS | Status: AC
  Administered 2020-06-22: 510 mg via INTRAVENOUS
  Filled 2020-06-22: qty 510

## 2020-07-12 ENCOUNTER — Other Ambulatory Visit: Payer: Self-pay | Admitting: Family Medicine

## 2020-08-08 ENCOUNTER — Other Ambulatory Visit: Payer: Self-pay

## 2020-08-08 ENCOUNTER — Emergency Department (HOSPITAL_COMMUNITY)
Admission: EM | Admit: 2020-08-08 | Discharge: 2020-08-08 | Disposition: A | Discharge status: Home / Self Care | Payer: Managed Care, Other (non HMO) | Attending: Emergency Medicine | Admitting: Emergency Medicine

## 2020-08-08 ENCOUNTER — Emergency Department (HOSPITAL_COMMUNITY): Payer: Managed Care, Other (non HMO)

## 2020-08-08 DIAGNOSIS — Z8541 Personal history of malignant neoplasm of cervix uteri: Secondary | ICD-10-CM | POA: Insufficient documentation

## 2020-08-08 DIAGNOSIS — Z87891 Personal history of nicotine dependence: Secondary | ICD-10-CM | POA: Diagnosis not present

## 2020-08-08 DIAGNOSIS — I10 Essential (primary) hypertension: Secondary | ICD-10-CM | POA: Insufficient documentation

## 2020-08-08 DIAGNOSIS — R079 Chest pain, unspecified: Secondary | ICD-10-CM | POA: Insufficient documentation

## 2020-08-08 DIAGNOSIS — M25511 Pain in right shoulder: Secondary | ICD-10-CM | POA: Insufficient documentation

## 2020-08-08 DIAGNOSIS — R102 Pelvic and perineal pain: Secondary | ICD-10-CM | POA: Diagnosis not present

## 2020-08-08 DIAGNOSIS — Z79899 Other long term (current) drug therapy: Secondary | ICD-10-CM | POA: Diagnosis not present

## 2020-08-08 DIAGNOSIS — G5602 Carpal tunnel syndrome, left upper limb: Secondary | ICD-10-CM
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
## 2020-08-08 MED ORDER — IBUPROFEN 800 MG PO TABS: 800.0000 mg | ORAL_TABLET | Freq: Three times a day (TID) | ORAL | 0 refills | Status: DC

## 2020-08-08 MED ORDER — ACETAMINOPHEN 325 MG PO TABS
650.0000 mg | ORAL_TABLET | Freq: Once | ORAL | Status: AC
  Administered 2020-08-08: 20:00:00 650 mg via ORAL
  Filled 2020-08-08: qty 2

## 2020-08-08 NOTE — ED Notes (Signed)
DC instructions reviewed with pt. PT verbalized understanding. Pt DC °

## 2020-08-08 NOTE — Discharge Instructions (Addendum)
Take NSAIDs or Tylenol as needed for the next week. Take this medicine with food. Use a heating pad for sore muscles - use for 20 minutes several times a day Try gentle range of motion exercises Return for worsening symptoms
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
## 2020-08-15 ENCOUNTER — Ambulatory Visit: Payer: Managed Care, Other (non HMO) | Admitting: Family Medicine

## 2020-08-23 ENCOUNTER — Other Ambulatory Visit: Payer: Self-pay

## 2020-08-23 ENCOUNTER — Encounter: Payer: Self-pay | Admitting: Family Medicine

## 2020-08-23 ENCOUNTER — Ambulatory Visit (INDEPENDENT_AMBULATORY_CARE_PROVIDER_SITE_OTHER): Payer: Managed Care, Other (non HMO) | Admitting: Family Medicine

## 2020-08-23 VITALS — BP 120/70 | HR 84 | Ht 61.0 in | Wt 209.0 lb

## 2020-08-23 DIAGNOSIS — M25511 Pain in right shoulder: Secondary | ICD-10-CM | POA: Insufficient documentation

## 2020-08-23 DIAGNOSIS — M7501 Adhesive capsulitis of right shoulder: Secondary | ICD-10-CM | POA: Diagnosis not present

## 2020-08-23 HISTORY — DX: Pain in right shoulder: M25.511

## 2020-08-23 MED ORDER — MELOXICAM 15 MG PO TABS: 15.0000 mg | ORAL_TABLET | Freq: Every day | ORAL | 0 refills | Status: AC
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
## 2020-08-23 NOTE — Patient Instructions (Addendum)
You have a frozen shoulder (adhesive capsulitis), a buildup of scar tissue that limits motion of the shoulder joint. Limit lifting and overhead activities as much as possible. Heat 15 minutes at a time 3-4 times a day may help with movement and stiffness. Treat with Meloxicam 15mg  daily for 14 days. Treat break through pain with Tylenol 325-650mg  q4-6 hours.  After the 2 weeks, you can do Ibuprofen 600 mg every 6 hours for pain. Codman exercises (pendulum, wall walking or table slides, arm circles) - do 3 sets of 10 once or twice a day. Physical therapy for rotator cuff strengthening is a consideration once you are out of the painful phase Follow up in 6 weeks       Adhesive Capsulitis  Adhesive capsulitis, also called frozen shoulder, causes the shoulder to become stiff and painful to move. This condition happens when there is inflammation of the tendons and ligaments that surround the shoulder joint (shoulder capsule). What are the causes? This condition may be caused by:  An injury to your shoulder joint.  Straining your shoulder.  Not moving your shoulder for a period of time. This can happen if your arm was injured or in a sling.  Long-standing conditions, such as: ? Diabetes. ? Thyroid problems. ? Heart disease. ? Stroke. ? Rheumatoid arthritis. ? Lung disease. In some cases, the cause is not known. What increases the risk? You are more likely to develop this condition if you are:  A woman.  Older than 49 years of age. What are the signs or symptoms? Symptoms of this condition include:  Pain in your shoulder when you move your arm. There may also be pain when parts of your shoulder are touched. The pain may be worse at night or when you are resting.  A sore or aching shoulder.  The inability to move your shoulder normally.  Muscle spasms. How is this diagnosed? This condition is diagnosed with a physical exam and imaging tests, such as an X-ray or MRI. How is  this treated? This condition may be treated with:  Treatment of the underlying cause or condition.  Medicine. Medicine may be given to relieve pain, inflammation, or muscle spasms.  Steroid injections into the shoulder joint.  Physical therapy. This involves performing exercises to get the shoulder moving again.  Acupuncture. This is a type of treatment that involves stimulating specific points on your body by inserting thin needles through your skin.  Shoulder manipulation. This is a procedure to move the shoulder into another position. It is done after you are given a medicine to make you fall asleep (general anesthetic). The joint may also be injected with salt water at high pressure to break down scarring.  Surgery. This may be done in severe cases when other treatments have failed. Although most people recover completely from adhesive capsulitis, some may not regain full shoulder movement. Follow these instructions at home: Managing pain, stiffness, and swelling  If directed, put ice on the injured area: ? Put ice in a plastic bag. ? Place a towel between your skin and the bag. ? Leave the ice on for 20 minutes, 2-3 times per day.  If directed, apply heat to the affected area before you exercise. Use the heat source that your health care provider recommends, such as a moist heat pack or a heating pad. ? Place a towel between your skin and the heat source. ? Leave the heat on for 20-30 minutes. ? Remove the heat if your skin  turns bright red. This is especially important if you are unable to feel pain, heat, or cold. You may have a greater risk of getting burned.      General instructions  Take over-the-counter and prescription medicines only as told by your health care provider.  If you are being treated with physical therapy, follow instructions from your physical therapist.  Avoid exercises that put a lot of demand on your shoulder, such as throwing. These exercises can  make pain worse.  Keep all follow-up visits as told by your health care provider. This is important. Contact a health care provider if:  You develop new symptoms.  Your symptoms get worse. Summary  Adhesive capsulitis, also called frozen shoulder, causes the shoulder to become stiff and painful to move.  You are more likely to have this condition if you are a woman and over age 82.  It is treated with physical therapy, medicines, and sometimes surgery. This information is not intended to replace advice given to you by your health care provider. Make sure you discuss any questions you have with your health care provider. Document Revised: 01/02/2018 Document Reviewed: 01/02/2018 Elsevier Patient Education  2021 [employer].

## 2020-08-23 NOTE — Assessment & Plan Note (Addendum)
Appears most consistent with adhesive capsulitis. Possible rotator cuff strain however difficult to assess given limited ROM and pain. - home exercises - Meloxicam 15mg  QD x 2 weeks with OTC tylenol for break through pain  - heat / ice - topical analgesics - One pain improves and if home exercises inadequate, can consider referral to PT - Encouraged to avoid NSAIDs while taking Meloxicam. Can start Ibuprofen 600mg  q6 hours PRn for pain relief once completes Mobic course - follow up 4-6 weeks if no improvement, sooner if worsening. Consider referral to sports medicine for ultrasound

## 2020-08-30 ENCOUNTER — Encounter: Payer: Managed Care, Other (non HMO) | Admitting: Obstetrics & Gynecology

## 2020-09-25 NOTE — Progress Notes (Deleted)
  Date of Visit: 09/26/2020   SUBJECTIVE:   HPI:  Adrienne Collins presents today for ***   OBJECTIVE:   There were no vitals taken for this visit. Gen: *** HEENT: *** Heart: *** Lungs: *** Neuro: *** Ext: ***  ASSESSMENT/PLAN:   Health maintenance:  -***  No problem-specific Assessment & Plan notes found for this encounter.  FOLLOW UP: Follow up in *** for ***  [place] J. Ardelia Mems, [place]

## 2020-09-26 ENCOUNTER — Ambulatory Visit: Payer: Managed Care, Other (non HMO) | Admitting: Family Medicine

## 2020-10-02 ENCOUNTER — Other Ambulatory Visit: Payer: Self-pay

## 2020-10-02 MED ORDER — ENALAPRIL-HYDROCHLOROTHIAZIDE 5-12.5 MG PO TABS: 1.0000 | ORAL_TABLET | Freq: Every day | ORAL | 0 refills | Status: DC

## 2020-10-02 NOTE — Telephone Encounter (Signed)
Please ask patient to schedule a follow up visit with me. She has no showed to her 2 most recent appts. Thanks Leeanne Rio, MD

## 2020-10-03 NOTE — Telephone Encounter (Signed)
Looks like she has an appt with you Thursday. Floreen Teegarden Kennon Holter, CMA

## 2020-10-05 ENCOUNTER — Ambulatory Visit: Payer: Managed Care, Other (non HMO) | Admitting: Family Medicine

## 2020-10-09 ENCOUNTER — Encounter: Payer: Managed Care, Other (non HMO) | Admitting: Obstetrics and Gynecology

## 2020-10-09 ENCOUNTER — Encounter: Payer: Managed Care, Other (non HMO) | Admitting: Student

## 2020-10-10 ENCOUNTER — Other Ambulatory Visit: Payer: Self-pay

## 2020-10-10 ENCOUNTER — Ambulatory Visit: Payer: Managed Care, Other (non HMO) | Admitting: Family Medicine

## 2020-10-10 ENCOUNTER — Encounter: Payer: Self-pay | Admitting: Family Medicine

## 2020-10-10 VITALS — BP 117/65 | HR 65 | Ht 60.0 in | Wt 205.4 lb

## 2020-10-10 DIAGNOSIS — M25511 Pain in right shoulder: Secondary | ICD-10-CM | POA: Diagnosis not present

## 2020-10-10 DIAGNOSIS — D509 Iron deficiency anemia, unspecified: Secondary | ICD-10-CM | POA: Diagnosis not present

## 2020-10-10 DIAGNOSIS — R350 Frequency of micturition: Secondary | ICD-10-CM | POA: Diagnosis not present

## 2020-10-10 DIAGNOSIS — I10 Essential (primary) hypertension: Secondary | ICD-10-CM

## 2020-10-10 DIAGNOSIS — G8929 Other chronic pain: Secondary | ICD-10-CM

## 2020-10-10 LAB — POCT URINALYSIS DIP (MANUAL ENTRY)
Bilirubin, UA: NEGATIVE
Blood, UA: NEGATIVE
Glucose, UA: NEGATIVE mg/dL
Ketones, POC UA: NEGATIVE mg/dL
Leukocytes, UA: NEGATIVE
Nitrite, UA: NEGATIVE
Protein Ur, POC: NEGATIVE mg/dL
Spec Grav, UA: 1.025 (ref 1.010–1.025)
Urobilinogen, UA: 0.2 E.U./dL
pH, UA: 6 (ref 5.0–8.0)

## 2020-10-10 MED ORDER — METOPROLOL SUCCINATE ER 50 MG PO TB24: ORAL_TABLET | ORAL | 3 refills | Status: DC

## 2020-10-10 MED ORDER — ENALAPRIL-HYDROCHLOROTHIAZIDE 5-12.5 MG PO TABS: 1.0000 | ORAL_TABLET | Freq: Every day | ORAL | 3 refills | Status: DC

## 2020-10-10 NOTE — Assessment & Plan Note (Signed)
Well controlled. Continue current medication regimen. Sent in refills of medications.

## 2020-10-10 NOTE — Assessment & Plan Note (Signed)
Currently no signs of adhesive capsulitis - full ROM of shoulder Concern for rotator cuff pathology Encouraged follow up with orthopedics as she is already established there for this problem Reviewed that if she does not want to get MRI, musculoskeletal POCUS may be an option if they offer it

## 2020-10-10 NOTE — Assessment & Plan Note (Signed)
S/p Feraheme infusion in November Update iron studies and CBC today
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
## 2020-10-10 NOTE — Patient Instructions (Signed)
Follow up with Dr. Percell Miller  Updating iron labs today  Let me know if you want to try medication for overactive bladder  Follow up with me in 6 months for your blood pressure  Be well, Dr. Ardelia Mems

## 2020-10-11 LAB — CBC
Hematocrit: 32.3 % — ABNORMAL LOW (ref 34.0–46.6)
Hemoglobin: 9.8 g/dL — ABNORMAL LOW (ref 11.1–15.9)
MCH: 23 pg — ABNORMAL LOW (ref 26.6–33.0)
MCHC: 30.3 g/dL — ABNORMAL LOW (ref 31.5–35.7)
MCV: 76 fL — ABNORMAL LOW (ref 79–97)
Platelets: 340 10*3/uL (ref 150–450)
RBC: 4.27 x10E6/uL (ref 3.77–5.28)
RDW: 15.6 % — ABNORMAL HIGH (ref 11.7–15.4)
WBC: 5.6 10*3/uL (ref 3.4–10.8)

## 2020-10-11 LAB — IRON AND TIBC
Iron Saturation: 6 % — CL (ref 15–55)
Iron: 22 ug/dL — ABNORMAL LOW (ref 27–159)
Total Iron Binding Capacity: 399 ug/dL (ref 250–450)
UIBC: 377 ug/dL (ref 131–425)

## 2020-10-11 LAB — FERRITIN: Ferritin: 8 ng/mL — ABNORMAL LOW (ref 15–150)

## 2020-12-04 ENCOUNTER — Encounter: Payer: Managed Care, Other (non HMO) | Admitting: Obstetrics & Gynecology

## 2021-02-05 ENCOUNTER — Other Ambulatory Visit: Payer: Self-pay | Admitting: Orthopedic Surgery

## 2021-02-05 DIAGNOSIS — M25511 Pain in right shoulder: Secondary | ICD-10-CM

## 2021-02-10 ENCOUNTER — Other Ambulatory Visit: Payer: Managed Care, Other (non HMO)

## 2021-03-16 ENCOUNTER — Ambulatory Visit: Payer: Managed Care, Other (non HMO)

## 2021-04-05 ENCOUNTER — Other Ambulatory Visit: Payer: Self-pay

## 2021-04-05 ENCOUNTER — Encounter: Payer: Self-pay | Admitting: Family Medicine

## 2021-04-05 ENCOUNTER — Ambulatory Visit (INDEPENDENT_AMBULATORY_CARE_PROVIDER_SITE_OTHER): Payer: Managed Care, Other (non HMO) | Admitting: Family Medicine

## 2021-04-05 VITALS — BP 123/78 | HR 83 | Wt 206.0 lb

## 2021-04-05 DIAGNOSIS — Z0000 Encounter for general adult medical examination without abnormal findings: Secondary | ICD-10-CM | POA: Diagnosis not present

## 2021-04-05 DIAGNOSIS — D509 Iron deficiency anemia, unspecified: Secondary | ICD-10-CM

## 2021-04-05 DIAGNOSIS — M25519 Pain in unspecified shoulder: Secondary | ICD-10-CM | POA: Diagnosis not present

## 2021-04-05 NOTE — Patient Instructions (Addendum)
It was great to see you again today!  Checking blood counts and iron levels today  Referring to sports medicine for your shoulder  See advanced directives packet  Be well, Dr. Ardelia Mems   Health Maintenance, Female Adopting a healthy lifestyle and getting preventive care are important in promoting health and wellness. Ask your health care provider about: The right schedule for you to have regular tests and exams. Things you can do on your own to prevent diseases and keep yourself healthy. What should I know about diet, weight, and exercise? Eat a healthy diet  Eat a diet that includes plenty of vegetables, fruits, low-fat dairy products, and lean protein. Do not eat a lot of foods that are high in solid fats, added sugars, or sodium.  Maintain a healthy weight Body mass index (BMI) is used to identify weight problems. It estimates body fat based on height and weight. Your health care provider can help determineyour BMI and help you achieve or maintain a healthy weight. Get regular exercise Get regular exercise. This is one of the most important things you can do for your health. Most adults should: Exercise for at least 150 minutes each week. The exercise should increase your heart rate and make you sweat (moderate-intensity exercise). Do strengthening exercises at least twice a week. This is in addition to the moderate-intensity exercise. Spend less time sitting. Even light physical activity can be beneficial. Watch cholesterol and blood lipids Have your blood tested for lipids and cholesterol at 49 years of age, then havethis test every 5 years. Have your cholesterol levels checked more often if: Your lipid or cholesterol levels are high. You are older than 49 years of age. You are at high risk for heart disease. What should I know about cancer screening? Depending on your health history and family history, you may need to have cancer screening at various ages. This may include  screening for: Breast cancer. Cervical cancer. Colorectal cancer. Skin cancer. Lung cancer. What should I know about heart disease, diabetes, and high blood pressure? Blood pressure and heart disease High blood pressure causes heart disease and increases the risk of stroke. This is more likely to develop in people who have high blood pressure readings, are of African descent, or are overweight. Have your blood pressure checked: Every 3-5 years if you are 44-68 years of age. Every year if you are 45 years old or older. Diabetes Have regular diabetes screenings. This checks your fasting blood sugar level. Have the screening done: Once every three years after age 56 if you are at a normal weight and have a low risk for diabetes. More often and at a younger age if you are overweight or have a high risk for diabetes. What should I know about preventing infection? Hepatitis B If you have a higher risk for hepatitis B, you should be screened for this virus. Talk with your health care provider to find out if you are at risk forhepatitis B infection. Hepatitis C Testing is recommended for: Everyone born from [birth year] through 1964. Anyone with known risk factors for hepatitis C. Sexually transmitted infections (STIs) Get screened for STIs, including gonorrhea and chlamydia, if: You are sexually active and are younger than 49 years of age. You are older than 49 years of age and your health care provider tells you that you are at risk for this type of infection. Your sexual activity has changed since you were last screened, and you are at increased risk for chlamydia or  gonorrhea. Ask your health care provider if you are at risk. Ask your health care provider about whether you are at high risk for HIV. Your health care provider may recommend a prescription medicine to help prevent HIV infection. If you choose to take medicine to prevent HIV, you should first get tested for HIV. You should then be tested  every 3 months for as long as you are taking the medicine. Pregnancy If you are about to stop having your period (premenopausal) and you may become pregnant, seek counseling before you get pregnant. Take 400 to 800 micrograms (mcg) of folic acid every day if you become pregnant. Ask for birth control (contraception) if you want to prevent pregnancy. Osteoporosis and menopause Osteoporosis is a disease in which the bones lose minerals and strength with aging. This can result in bone fractures. If you are 13 years old or older, or if you are at risk for osteoporosis and fractures, ask your health care provider if you should: Be screened for bone loss. Take a calcium or vitamin D supplement to lower your risk of fractures. Be given hormone replacement therapy (HRT) to treat symptoms of menopause. Follow these instructions at home: Lifestyle Do not use any products that contain nicotine or tobacco, such as cigarettes, e-cigarettes, and chewing tobacco. If you need help quitting, ask your health care provider. Do not use street drugs. Do not share needles. Ask your health care provider for help if you need support or information about quitting drugs. Alcohol use Do not drink alcohol if: Your health care provider tells you not to drink. You are pregnant, may be pregnant, or are planning to become pregnant. If you drink alcohol: Limit how much you use to 0-1 drink a day. Limit intake if you are breastfeeding. Be aware of how much alcohol is in your drink. In the [place] one drink equals one 12 oz bottle of beer (355 mL), one 5 oz glass of wine (148 mL), or one 1 oz glass of hard liquor (44 mL). General instructions Schedule regular health, dental, and eye exams. Stay current with your vaccines. Tell your health care provider if: You often feel depressed. You have ever been abused or do not feel safe at home. Summary Adopting a healthy lifestyle and getting preventive care are important in  promoting health and wellness. Follow your health care provider's instructions about healthy diet, exercising, and getting tested or screened for diseases. Follow your health care provider's instructions on monitoring your cholesterol and blood pressure. This information is not intended to replace advice given to you by your health care provider. Make sure you discuss any questions you have with your healthcare provider. Document Revised: 07/22/2018 Document Reviewed: 07/22/2018 Elsevier Patient Education  2022 [employer].

## 2021-04-05 NOTE — Progress Notes (Signed)
  Date of Visit: 04/05/2021   SUBJECTIVE:   HPI:  Adrienne Collins presents today for a well woman exam.   Concerns today: requests referral to sports medicine for shoulder pain Periods: irregular, has appointment with GYN next month Contraception: abstinent Pelvic symptoms: bulk symptoms possibly from fibroids, pressure/not urinating super well, planning to discuss w/ GYN next month  Sexual activity: abstinent Pap smear status: UTD Exercise: walks, does chair exercises daily Smoking: no, quit 1995 Alcohol: no Drugs: no Advance directives: does not have  Mood: doing well overall Dentist: goes regularly Cancers in family: mom and aunt with breast cancer  She is coping well after the recent death of her mother.  Anemia: not taking iron consistently, not able to identify specific reason for not taking, just doesn't  OBJECTIVE:   BP 123/78   Pulse 83   Wt 206 lb (93.4 kg)   SpO2 100%   BMI 40.23 kg/m  Gen: NAD, pleasant, cooperative HEENT: NCAT, PERRL, no palpable thyromegaly or anterior cervical lymphadenopathy Heart: RRR, no murmurs Lungs: CTAB, NWOB Abdomen: soft, nontender to palpation Neuro: grossly nonfocal, speech normal  ASSESSMENT/PLAN:   Health maintenance:  -STD screening: declines -pap smear: UTD -mammogram: recently had done through [facility], will obtain -lipid screening: had within last year -colon cancer screening: UTD -immunizations:  Flu: advised to get once available Tdap: UTD COVID: UTD -advanced directives: given packet with info  -handout given on health maintenance topics  Anemia Update CBC and ferritin today, anticipate will be low due to nonadherence with iron  Refer to sports medicine for shoulder pain at patient's request (did not discuss in detail today)  FOLLOW UP: Follow up in 6 mos for chronic medical problems Referring to sports medicine   Adrienne Collins, [place]

## 2021-04-06 ENCOUNTER — Telehealth: Payer: Self-pay | Admitting: Family Medicine

## 2021-04-06 DIAGNOSIS — D649 Anemia, unspecified: Secondary | ICD-10-CM

## 2021-04-06 DIAGNOSIS — R7303 Prediabetes: Secondary | ICD-10-CM

## 2021-04-06 LAB — FERRITIN: Ferritin: 7 ng/mL — ABNORMAL LOW (ref 15–150)

## 2021-04-06 LAB — CBC
Hematocrit: 24 % — ABNORMAL LOW (ref 34.0–46.6)
Hemoglobin: 6.6 g/dL — CL (ref 11.1–15.9)
MCH: 18 pg — ABNORMAL LOW (ref 26.6–33.0)
MCHC: 27.5 g/dL — ABNORMAL LOW (ref 31.5–35.7)
MCV: 66 fL — ABNORMAL LOW (ref 79–97)
Platelets: 421 10*3/uL (ref 150–450)
RBC: 3.66 x10E6/uL — ABNORMAL LOW (ref 3.77–5.28)
RDW: 19.2 % — ABNORMAL HIGH (ref 11.7–15.4)
WBC: 5.8 10*3/uL (ref 3.4–10.8)

## 2021-04-06 NOTE — Telephone Encounter (Signed)
Called patient to discuss lab results - specifically, Hgb of 6.6 No answer. LVM asking her to call back. I will also send her a mychart message.  Leeanne Rio, MD

## 2021-04-06 NOTE — Telephone Encounter (Signed)
Spoke with patient.  She is feeling tired but no trouble breathing. Currently on day 7 of her period. She would like to proceed with transfusion but prefers to avoid the ED and is okay with waiting until next week for an appointment for outpatient transfusion.  Was able to get her an appointment for outpatient transfusion of 2u of PRBC next Thursday at [place] at [facility]. Orders entered in to Epic.  Patient informed of appointment. Reviewed risks/benefits of transfusion, she is agreeable.  Recommend taking oral iron pills 2-3 times a day if she can tolerate. She says she will do this.  Has upcoming appointment with GYN, hopefully can discuss management of menorrhagia at that visit.  She will schedule a lab visit here in 2-3 weeks after the transfusion. Will recheck CBC, ferritin, A1c, and BMET at that visit.  Leeanne Rio, MD

## 2021-04-11 ENCOUNTER — Other Ambulatory Visit: Payer: Self-pay

## 2021-04-11 ENCOUNTER — Non-Acute Institutional Stay (HOSPITAL_COMMUNITY)
Admission: RE | Admit: 2021-04-11 | Discharge: 2021-04-11 | Disposition: A | Discharge status: Home / Self Care | Payer: Managed Care, Other (non HMO) | Source: Ambulatory Visit | Attending: Internal Medicine | Admitting: Internal Medicine

## 2021-04-11 DIAGNOSIS — D649 Anemia, unspecified: Secondary | ICD-10-CM | POA: Diagnosis present

## 2021-04-11 LAB — PREPARE RBC (CROSSMATCH)

## 2021-04-11 LAB — ABO/RH: ABO/RH(D): O POS

## 2021-04-11 MED ORDER — SODIUM CHLORIDE 0.9% IV SOLUTION: Freq: Once | INTRAVENOUS | Status: AC

## 2021-04-11 NOTE — Progress Notes (Signed)
PATIENT CARE CENTER NOTE    Diagnosis: Anemia    Provider: Chrisandra Netters, MD   Procedure: Blood transfusion 2 units    Note:  Patient arrived for blood transfusion. Type & Screen and H&H drawn. Both labs sent down to [facility] lab but H&H never resulted.  Patient received 2 units PRBCs via PIV. Tolerated well with no adverse reaction. Vital signs remained stable throughout transfusions. Discharge instructions given. Patient to follow up with Dr. Ardelia Mems next month.  Patient alert, oriented and ambulatory at discharge.

## 2021-04-12 ENCOUNTER — Encounter (HOSPITAL_COMMUNITY): Payer: Managed Care, Other (non HMO)

## 2021-04-12 ENCOUNTER — Encounter: Payer: Self-pay | Admitting: Family Medicine

## 2021-04-12 LAB — TYPE AND SCREEN
ABO/RH(D): O POS
Antibody Screen: NEGATIVE
Unit division: 0
Unit division: 0

## 2021-04-12 LAB — BPAM RBC
Blood Product Expiration Date: 202210012359
Blood Product Expiration Date: 202210012359
ISSUE DATE / TIME: 202208311058
ISSUE DATE / TIME: 202208311058
Unit Type and Rh: 5100
Unit Type and Rh: 5100

## 2021-04-18 ENCOUNTER — Ambulatory Visit: Payer: Self-pay | Admitting: Sports Medicine

## 2021-04-25 ENCOUNTER — Ambulatory Visit: Payer: Managed Care, Other (non HMO) | Admitting: Family Medicine

## 2021-04-26 ENCOUNTER — Encounter: Payer: Self-pay | Admitting: Nurse Practitioner

## 2021-04-26 ENCOUNTER — Ambulatory Visit: Payer: Managed Care, Other (non HMO) | Admitting: Nurse Practitioner

## 2021-04-26 ENCOUNTER — Other Ambulatory Visit: Payer: Self-pay

## 2021-04-26 ENCOUNTER — Other Ambulatory Visit (HOSPITAL_COMMUNITY)
Admission: RE | Admit: 2021-04-26 | Discharge: 2021-04-26 | Disposition: A | Discharge status: Home / Self Care | Payer: Managed Care, Other (non HMO) | Source: Ambulatory Visit | Attending: Nurse Practitioner | Admitting: Nurse Practitioner

## 2021-04-26 VITALS — BP 129/68 | HR 57 | Wt 202.9 lb

## 2021-04-26 DIAGNOSIS — D219 Benign neoplasm of connective and other soft tissue, unspecified: Secondary | ICD-10-CM

## 2021-04-26 DIAGNOSIS — R102 Pelvic and perineal pain: Secondary | ICD-10-CM

## 2021-04-26 DIAGNOSIS — N898 Other specified noninflammatory disorders of vagina: Secondary | ICD-10-CM | POA: Diagnosis not present

## 2021-04-26 DIAGNOSIS — N920 Excessive and frequent menstruation with regular cycle: Secondary | ICD-10-CM

## 2021-04-26 DIAGNOSIS — D259 Leiomyoma of uterus, unspecified: Secondary | ICD-10-CM

## 2021-04-26 HISTORY — DX: Leiomyoma of uterus, unspecified: D25.9

## 2021-04-26 LAB — POCT URINALYSIS DIP (DEVICE)
Bilirubin Urine: NEGATIVE
Glucose, UA: NEGATIVE mg/dL
Hgb urine dipstick: NEGATIVE
Ketones, ur: NEGATIVE mg/dL
Leukocytes,Ua: NEGATIVE
Nitrite: NEGATIVE
Protein, ur: NEGATIVE mg/dL
Specific Gravity, Urine: 1.025 (ref 1.005–1.030)
Urobilinogen, UA: 1 mg/dL (ref 0.0–1.0)
pH: 6 (ref 5.0–8.0)

## 2021-04-26 NOTE — Progress Notes (Signed)
GYNECOLOGY OFFICE VISIT NOTE   History:  50 y.o. G2P0 here today for follow up for heavy bleeding and uterine fibroids.  Has had heavy vaginal bleeding with monthly menses for about 10 years.  The last 2 years have been even heavier.  She has had chronic anemia and takes and oral iron supplement most days.  Recently her anemia was even worse with HGB down to 6 and she received 2 units of blood.  She knows she has fibroids and feels more internal pressure when she is on the toilet to urinate. She denies any abnormal vaginal discharge,  pelvic pain or other concerns.  She had an annual visit at her PCP in August 2022.  Does not need pap smear today and has had mammogram.  Past Medical History:  Diagnosis Date   Anemia    Arthritis    Colon polyps    Flank pain 06/27/2014   Glucose intolerance (impaired glucose tolerance) 12/08/2012   A1c of 6.2 with one random >200 and one fasting capilllary blood glucose of 129    History of ETT 08/2006   no sustained tachycardia   Hypertension    Lumbar paraspinal muscle spasm 06/14/2013   Prediabetes    S/P colonoscopy 10/27/2008   [place]   Sleep apnea     Past Surgical History:  Procedure Laterality Date   CESAREAN SECTION  1990   CESAREAN SECTION  1991   SALPINGECTOMY Right     The following portions of the patient's history were reviewed and updated as appropriate: allergies, current medications, past family history, past medical history, past social history, past surgical history and problem list.   Health Maintenance:  Normal pap and negative HRHPV on 06-14-20.  Normal mammogram on 04-03-21.   Review of Systems:  Pertinent items noted in HPI and remainder of comprehensive ROS otherwise negative.  Objective:  Physical Exam BP 129/68   Pulse (!) 57   Wt 202 lb 14.4 oz (92 kg)   LMP 03/21/2021 (Approximate)   BMI 39.63 kg/m  CONSTITUTIONAL: Well-developed, well-nourished female in no acute distress.  HENT:  Normocephalic, atraumatic.  External right and left ear normal.  EYES: Conjunctivae and EOM are normal. Pupils are equal, round.  No scleral icterus.  NECK: Normal range of motion, supple, no masses SKIN: Skin is warm and dry. No rash noted. Not diaphoretic. No erythema. No pallor. NEUROLOGIC: Alert and oriented to person, place, and time. Normal muscle tone coordination. No cranial nerve deficit noted. PSYCHIATRIC: Normal mood and affect. Normal behavior. Normal judgment and thought content. CARDIOVASCULAR: Normal heart rate noted RESPIRATORY: Effort and breath sounds normal, no problems with respiration noted ABDOMEN: Soft, no distention noted.   PELVIC:  Normal genitalia, lots of yellow vaginal discharge on speculum exam.   Bimanual done but limited exam due to habitus. MUSCULOSKELETAL: Normal range of motion. No edema noted.  Labs and Imaging No results found.  Assessment & Plan:  1. Menorrhagia with regular cycle Will repeat US last done 2 years ago and will follow up with MD afterwards for plan as vaginal bleeding has been so severe she has needed a blood transfusion in August 2022.  - US PELVIC COMPLETE WITH TRANSVAGINAL; Future  2. Vaginal discharge Labs pending  - Cervicovaginal ancillary only( [place])  3. Fibroids Ultrasound from 09-04-18 reviewed - 3 fibroids with one 5 cm in size displacing the uterus to the right.   Routine preventative health maintenance measures emphasized. Please refer to After Visit Summary for  other counseling recommendations.   Return in about 2 weeks (around 05/10/2021) for MD follow up after ultrasound.   Total face-to-face time with patient: 25 minutes.  Over 50% of encounter was spent on counseling and coordination of care.  Earlie Server, RN, MSN, NP-BC Nurse Practitioner, [facility] for [employer], [facility] 04/26/2021 6:20 PM

## 2021-04-27 ENCOUNTER — Other Ambulatory Visit: Payer: Managed Care, Other (non HMO)

## 2021-04-27 LAB — CERVICOVAGINAL ANCILLARY ONLY
Bacterial Vaginitis (gardnerella): NEGATIVE
Candida Glabrata: NEGATIVE
Candida Vaginitis: NEGATIVE
Chlamydia: NEGATIVE
Comment: NEGATIVE
Comment: NEGATIVE
Comment: NEGATIVE
Comment: NEGATIVE
Comment: NEGATIVE
Comment: NORMAL
Neisseria Gonorrhea: NEGATIVE
Trichomonas: NEGATIVE

## 2021-04-30 ENCOUNTER — Other Ambulatory Visit: Payer: Self-pay

## 2021-04-30 ENCOUNTER — Other Ambulatory Visit (INDEPENDENT_AMBULATORY_CARE_PROVIDER_SITE_OTHER): Payer: Managed Care, Other (non HMO)

## 2021-04-30 ENCOUNTER — Encounter: Payer: Self-pay | Admitting: Family Medicine

## 2021-04-30 ENCOUNTER — Ambulatory Visit (INDEPENDENT_AMBULATORY_CARE_PROVIDER_SITE_OTHER): Payer: Managed Care, Other (non HMO) | Admitting: Family Medicine

## 2021-04-30 ENCOUNTER — Ambulatory Visit: Payer: Self-pay

## 2021-04-30 VITALS — Ht 61.0 in | Wt 202.0 lb

## 2021-04-30 DIAGNOSIS — R7303 Prediabetes: Secondary | ICD-10-CM | POA: Diagnosis not present

## 2021-04-30 DIAGNOSIS — M25511 Pain in right shoulder: Secondary | ICD-10-CM

## 2021-04-30 DIAGNOSIS — D649 Anemia, unspecified: Secondary | ICD-10-CM

## 2021-04-30 DIAGNOSIS — G8929 Other chronic pain: Secondary | ICD-10-CM

## 2021-04-30 LAB — POCT GLYCOSYLATED HEMOGLOBIN (HGB A1C): Hemoglobin A1C: 5.4 % (ref 4.0–5.6)
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
## 2021-04-30 NOTE — Patient Instructions (Signed)
I'm worried your tore one of your rotator cuff muscles. We will go ahead with an MRI to assess for this. You've also not improved with over 6 weeks of physical therapy, injections, and x-rays were negative. I will call you with results and next steps.

## 2021-04-30 NOTE — Assessment & Plan Note (Signed)
Adrienne Collins concerning for supraspinatus tear Patient has been able to complete formal PT for 6 week duration as well as corticosteroid injection with minimal improvement of pain Patient initially denied for right shoulder MRI but given findings on Adrienne Collins and need for advanced imaging to fully evaluate shoulder, recommended that patient proceed with MRI of right shoulder as she continues to have difficulty with lifting right upper extremity overhead

## 2021-05-03 ENCOUNTER — Other Ambulatory Visit: Payer: Self-pay

## 2021-05-03 ENCOUNTER — Ambulatory Visit
Admission: RE | Admit: 2021-05-03 | Discharge: 2021-05-03 | Disposition: A | Discharge status: Home / Self Care | Payer: Managed Care, Other (non HMO) | Source: Ambulatory Visit | Attending: Family Medicine | Admitting: Family Medicine

## 2021-05-03 DIAGNOSIS — M25511 Pain in right shoulder: Secondary | ICD-10-CM

## 2021-05-03 DIAGNOSIS — G8929 Other chronic pain: Secondary | ICD-10-CM

## 2021-05-06 ENCOUNTER — Other Ambulatory Visit: Payer: Self-pay

## 2021-05-06 ENCOUNTER — Ambulatory Visit: Admission: RE | Admit: 2021-05-06 | Payer: Managed Care, Other (non HMO) | Source: Ambulatory Visit

## 2021-05-07 ENCOUNTER — Ambulatory Visit
Admission: RE | Admit: 2021-05-07 | Discharge: 2021-05-07 | Disposition: A | Discharge status: Home / Self Care | Payer: Managed Care, Other (non HMO) | Source: Ambulatory Visit | Attending: Nurse Practitioner | Admitting: Nurse Practitioner

## 2021-05-07 DIAGNOSIS — N920 Excessive and frequent menstruation with regular cycle: Secondary | ICD-10-CM | POA: Diagnosis present

## 2021-05-07 IMAGING — US US PELVIS COMPLETE WITH TRANSVAGINAL
1 series · 15 of 25 positions shown · non-contrast
Comparison: [DATE]

CLINICAL DATA: Abdominal pain, menorrhagia, G2P2, with LMP of
[DATE]. Past history of Caesarean section and RIGHT
salpingectomy

EXAM:
TRANSABDOMINAL AND TRANSVAGINAL ULTRASOUND OF PELVIS
TECHNIQUE: Both transabdominal and transvaginal ultrasound examinations of the
pelvis were performed. Transabdominal technique was performed for
global imaging of the pelvis including uterus, ovaries, adnexal
regions, and pelvic cul-de-sac. It was necessary to proceed with
endovaginal exam following the transabdominal exam to visualize the
endometrium and ovaries.

[Series 1: us pelvis complete with transvaginal · 15 of 120 slices shown]
[im 1/120]
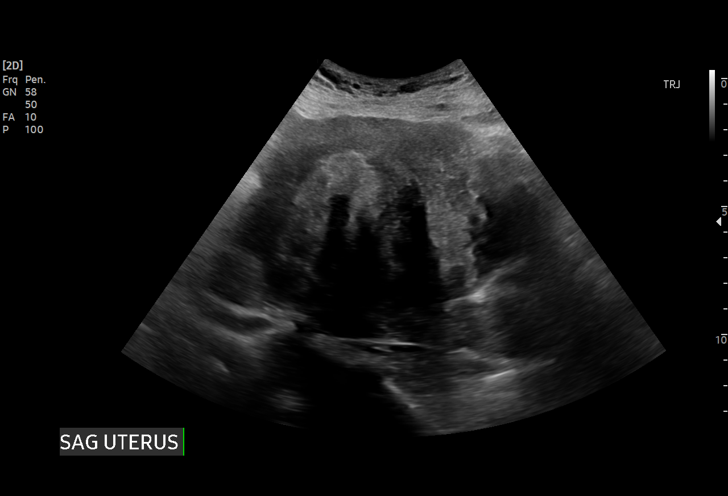
[im 10/120]
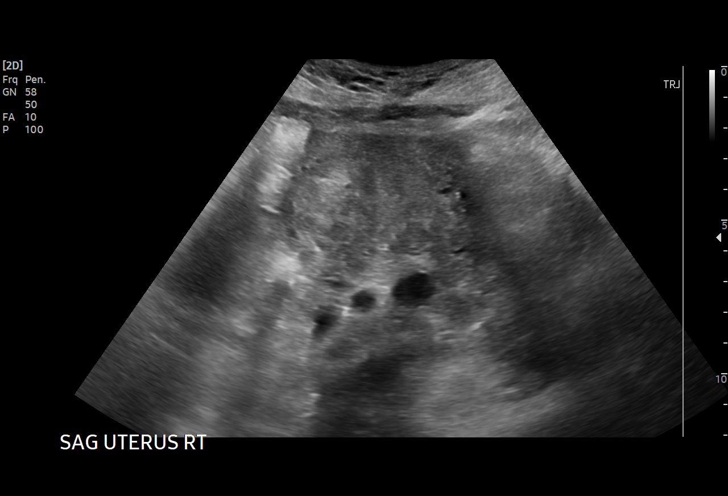
[im 20/120]
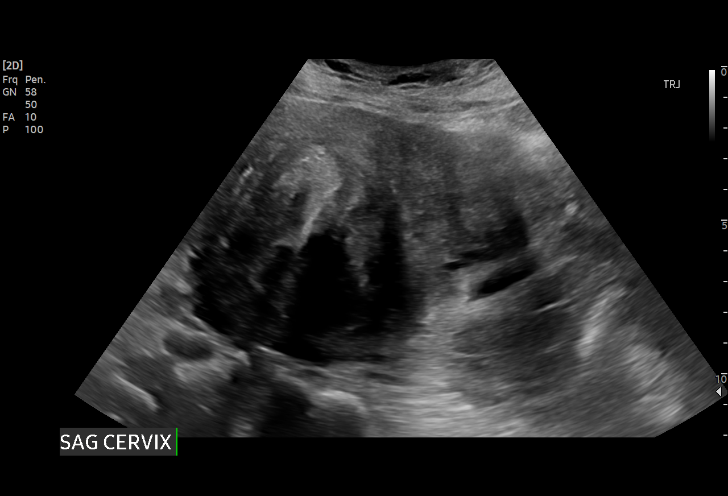
[im 25/120]
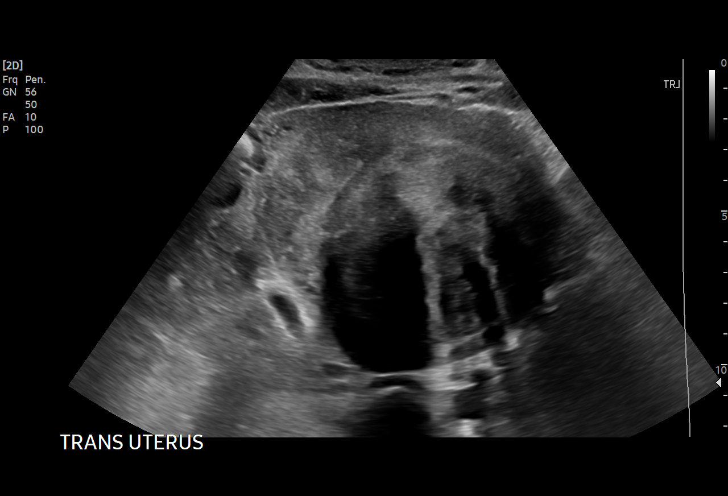
[im 35/120]
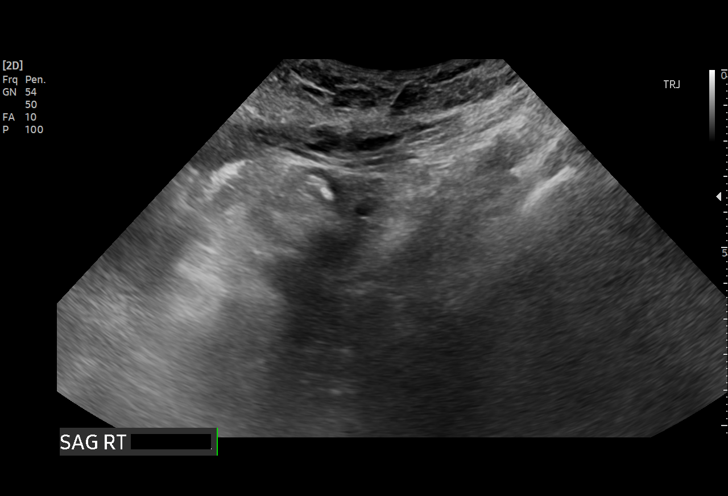
[im 45/120]
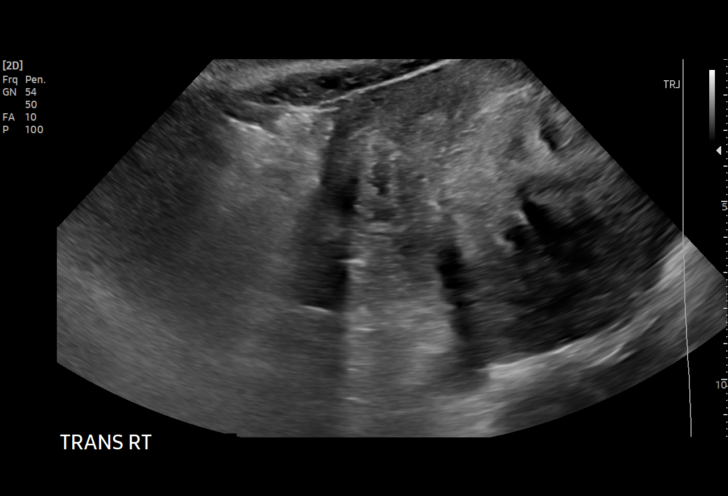
[im 50/120]
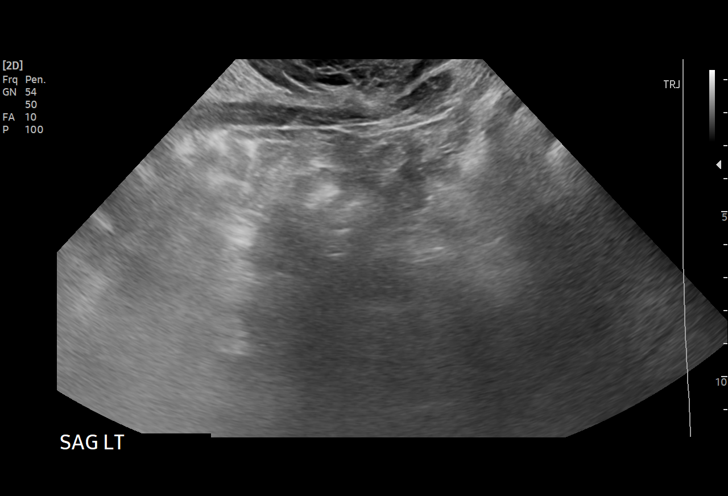
[im 60/120]
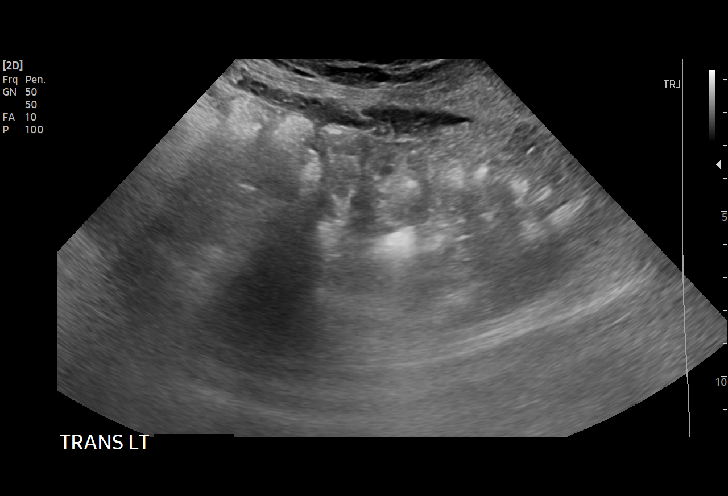
[im 70/120]
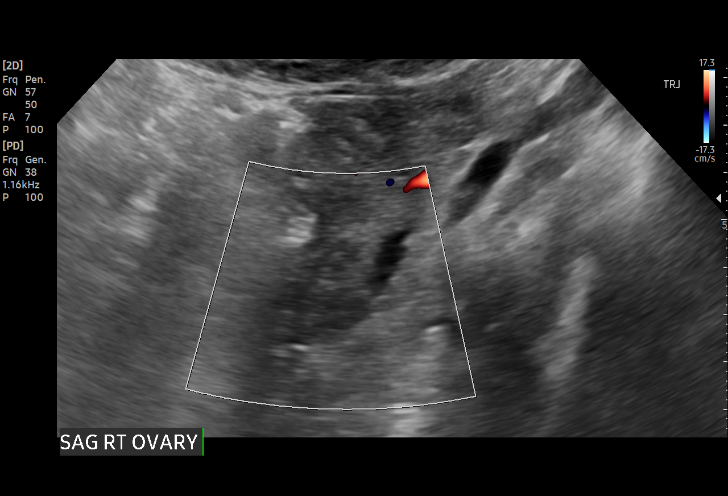
[im 75/120]
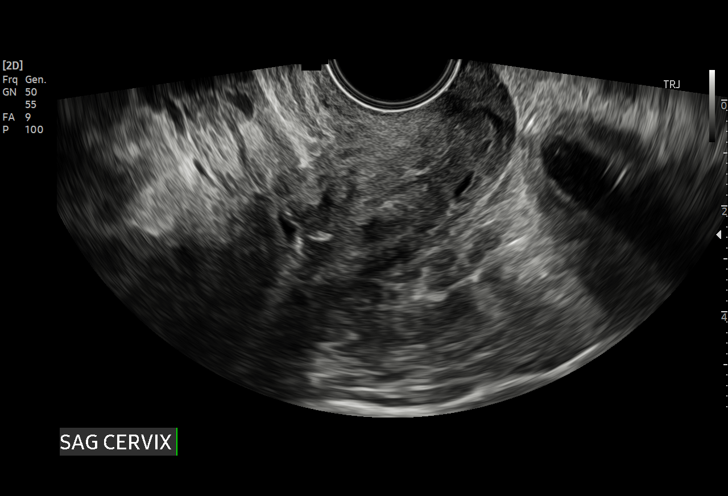
[im 85/120]
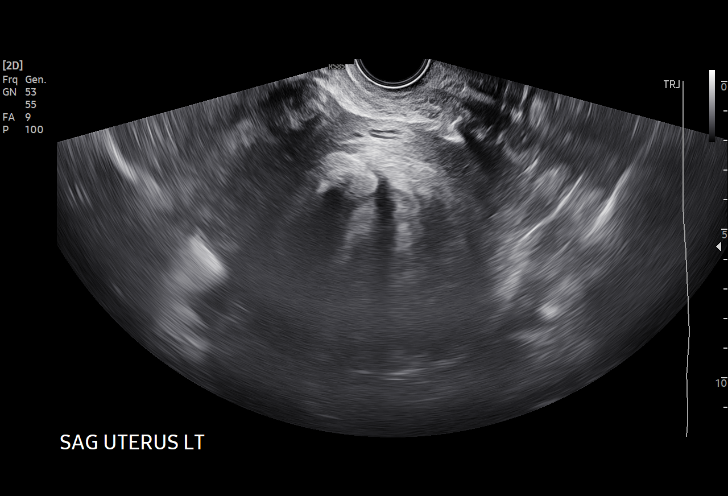
[im 95/120]
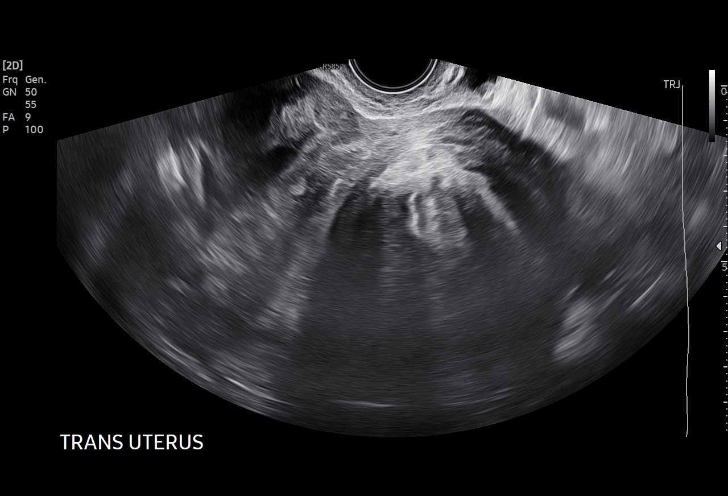
[im 100/120]
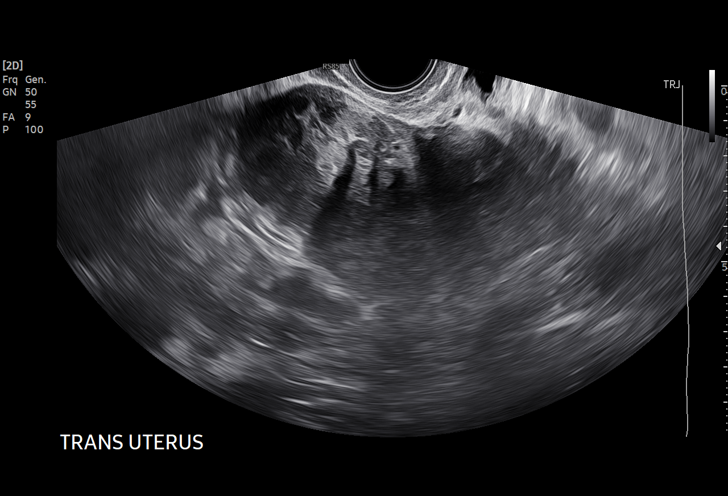
[im 110/120]
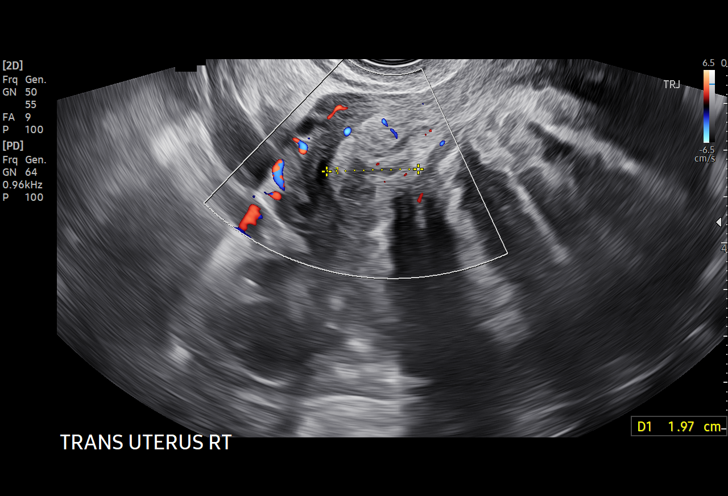
[im 120/120]
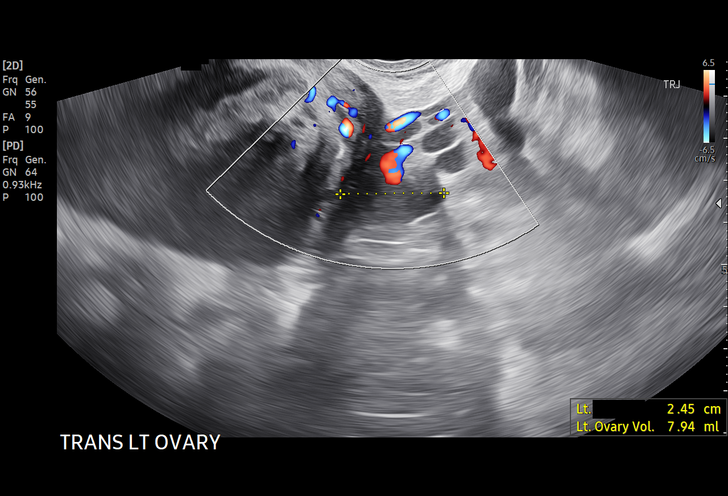

[15 of 25 positions shown; findings below may reference images not displayed]

FINDINGS: Uterus

Measurements: 13.4 x 9.9 x 10.0 cm = volume: 694 mL. Anteverted.
Enlarged and nodular containing multiple leiomyomata. Measured
leiomyomata include anterior wall 3.3 cm leiomyoma, large anterior
transmural leiomyoma 7.2 cm extending to LEFT, RIGHT fundal
leiomyoma anteriorly 5.4 cm, and a posterior LEFT upper leiomyoma
3.7 cm. The smaller 2 of these leiomyoma appear to extend
submucosal.

Endometrium

Thickness: 13 mm. No endometrial fluid or mass identified, though
portions of the endometrium are partially obscured by leiomyomata.

Right ovary

Measurements: 4.2 x 2.2 x 2.2 cm = volume: 10.8 mL. Normal
morphology without mass

Left ovary

Measurements: 3.3 x 1.9 x 2.5 cm = volume: 7.9 mL. Normal morphology
without mass

Other findings

Trace free pelvic fluid.  No adnexal masses.
IMPRESSION: Enlarged uterus containing multiple leiomyomata, some of which
extend submucosal.

## 2021-05-08 ENCOUNTER — Telehealth: Payer: Self-pay | Admitting: Family Medicine

## 2021-05-08 MED ORDER — DIAZEPAM 5 MG PO TABS: ORAL_TABLET | ORAL | 0 refills | Status: DC

## 2021-05-08 NOTE — Telephone Encounter (Signed)
-----   Message from [facility], LAT sent at 05/07/2021  9:02 AM EDT ----- Regarding: FW: phone message  ----- Message ----- From: Carolyne Littles Sent: 05/07/2021   8:53 AM EDT To: Jolinda Croak, LAT Subject: phone message                                  Pt was unable to complete her MRI, she is asking for medication to help for next appt. Please send to [facility] on [email or web address].

## 2021-05-10 ENCOUNTER — Other Ambulatory Visit: Payer: Self-pay

## 2021-05-10 ENCOUNTER — Encounter: Payer: Self-pay | Admitting: Family Medicine

## 2021-05-10 ENCOUNTER — Ambulatory Visit (INDEPENDENT_AMBULATORY_CARE_PROVIDER_SITE_OTHER): Payer: Managed Care, Other (non HMO) | Admitting: Family Medicine

## 2021-05-10 VITALS — BP 124/55 | HR 61 | Wt 205.6 lb

## 2021-05-10 DIAGNOSIS — R7303 Prediabetes: Secondary | ICD-10-CM

## 2021-05-10 DIAGNOSIS — D649 Anemia, unspecified: Secondary | ICD-10-CM | POA: Diagnosis not present

## 2021-05-10 DIAGNOSIS — D508 Other iron deficiency anemias: Secondary | ICD-10-CM

## 2021-05-10 DIAGNOSIS — D251 Intramural leiomyoma of uterus: Secondary | ICD-10-CM | POA: Diagnosis not present

## 2021-05-10 DIAGNOSIS — N920 Excessive and frequent menstruation with regular cycle: Secondary | ICD-10-CM

## 2021-05-10 DIAGNOSIS — I10 Essential (primary) hypertension: Secondary | ICD-10-CM

## 2021-05-10 DIAGNOSIS — D250 Submucous leiomyoma of uterus: Secondary | ICD-10-CM

## 2021-05-10 DIAGNOSIS — M7540 Impingement syndrome of unspecified shoulder: Secondary | ICD-10-CM

## 2021-05-10 MED ORDER — POLYETHYLENE GLYCOL 3350 17 GM/SCOOP PO POWD: 17.0000 g | Freq: Two times a day (BID) | ORAL | 1 refills | Status: DC | PRN

## 2021-05-10 NOTE — Patient Instructions (Signed)
Checking labs today Recommend you get the new COVID booster at your pharmacy Use miralax as needed for constipation from iron  Follow up with me in March or April, sooner if needed  Be well, Dr. Ardelia Mems

## 2021-05-10 NOTE — Assessment & Plan Note (Addendum)
Pelvic discomfort/pain likely due to bulk symptoms related to fibroids. Pelvic US on 05/07/21 revealed 4 leiomyomas ranging from 3cm to 7cm. Following with OB/G and has upcoming appointment to discuss options for management. Patient is open to idea of hysterectomy should it be recommended.

## 2021-05-10 NOTE — Assessment & Plan Note (Signed)
Has not yet gotten MRI due to claustrophobia. Following with sports med who prescribed valium for her to take prior to procedure.

## 2021-05-10 NOTE — Progress Notes (Signed)
    SUBJECTIVE:   CHIEF COMPLAINT / HPI:   Adrienne Collins is here for follow-up after a recent blood transfusion for low Hgb of 6.6 a month ago.  Menorrhagia - Adrienne Collins has been taking iron pills 3x a day and has experienced more constipation alongside it. She has been having irregular, heavy bleeding with her menstrual cycle which is normal for her. Has upcoming appointment with OBGYN surgeon to discuss options for management of known large fibroids. Had recent pelvic u/s demonstrating up to 7cm fibroid. Some of her fibroids extend submucosal. Continues to have pelvic pressure. Has been taking iron about daily, sometimes up to three times daily.  Hypertension - BP today is 124/55. Taking metoprolol succinate 50 mg daily and enalapril-hydrochlorothaizide 5mg -12.5mg  daily.  Shoulder pain - She is seeing sports med for shoulder pain. She was scheduled to get an MRI but had trouble getting into the tube due to claustrophobia. She was prescribed valium to take prior to her MRI but has not gotten around to picking it up/is hesitant it may not work.  OBJECTIVE:   BP (!) 124/55   Pulse 61   Wt 205 lb 9.6 oz (93.3 kg)   SpO2 100%   BMI 38.85 kg/m   GEN: Well appearing, in no acute distress CARD: RRR, no murmurs or gallops. PULM: Clear to auscultation, normal work of breathing GI: Soft, nontender NEUR: Grossly normal   ASSESSMENT/PLAN:   Fibroid uterus Pelvic discomfort/pain likely due to bulk symptoms related to fibroids. Pelvic US on 05/07/21 revealed 4 leiomyomas ranging from 3cm to 7cm. Following with OB/G and has upcoming appointment to discuss options for management. Patient is open to idea of hysterectomy should it be recommended.   Prediabetes HbA1C from 04/30/21 was 5.3.  Anemia, iron deficiency Patient doing well after blood transfusion. Patient taking 3 iron pills a day.  Sent prescription for Miralax for related constipation. Obtaining CBC, ferritin and BMP today to assess anemia after  last menstrual cycle. Labs from 04/30/21 were taken on patient's 2nd day of cycle (cycle started 9/17). Hgb was 11.1. Will call patient with results of lab work. May schedule return appointment in 1 month if levels are low. Otherwise RTC in March 2023.  Rotator cuff impingement syndrome Has not yet gotten MRI due to claustrophobia. Following with sports med who prescribed valium for her to take prior to procedure.   Hypertension Well controlled on metoprolol succinate 50 mg and enalapril-hydrochlorothaizide 5mg -12.5mg  daily. BP 124/55 today. Continue current medication regimen.  Menorrhagia Almost certainly related to large, submucosal fibroids. Recheck CBC and ferritin today after recent transfusion. Has had menses since last CBC check. Follow up with OB/GYN as scheduled Continue PO iron   Health Maintenance Received flu shot at work on 05/09/21  Adrienne Collins, [place]   Patient seen along with MS3 student Cristobal Goldmann. I personally evaluated this patient along with the student, and verified all aspects of the history, physical exam, and medical decision making as documented by the student. I agree with the student's documentation and have made all necessary edits.  Chrisandra Netters, MD  [place]

## 2021-05-10 NOTE — Assessment & Plan Note (Signed)
Patient doing well after blood transfusion. Patient taking 3 iron pills a day.  Sent prescription for Miralax for related constipation. Obtaining CBC, ferritin and BMP today to assess anemia after last menstrual cycle. Labs from 04/30/21 were taken on patient's 2nd day of cycle (cycle started 9/17). Hgb was 11.1. Will call patient with results of lab work. May schedule return appointment in 1 month if levels are low. Otherwise RTC in March 2023.

## 2021-05-10 NOTE — Assessment & Plan Note (Signed)
HbA1C from 04/30/21 was 5.3.

## 2021-05-10 NOTE — Assessment & Plan Note (Signed)
Well controlled on metoprolol succinate 50 mg and enalapril-hydrochlorothaizide 5mg -12.5mg  daily. BP 124/55 today. Continue current medication regimen.

## 2021-05-11 LAB — BASIC METABOLIC PANEL
BUN/Creatinine Ratio: 15 (ref 9–23)
BUN: 9 mg/dL (ref 6–24)
CO2: 21 mmol/L (ref 20–29)
Calcium: 9.2 mg/dL (ref 8.7–10.2)
Chloride: 100 mmol/L (ref 96–106)
Creatinine, Ser: 0.61 mg/dL (ref 0.57–1.00)
Glucose: 130 mg/dL — ABNORMAL HIGH (ref 70–99)
Potassium: 4.5 mmol/L (ref 3.5–5.2)
Sodium: 137 mmol/L (ref 134–144)
eGFR: 110 mL/min/{1.73_m2} (ref >59)

## 2021-05-11 LAB — CBC
Hematocrit: 36.8 % (ref 34.0–46.6)
Hematocrit: 33.5 % — ABNORMAL LOW (ref 34.0–46.6)
Hemoglobin: 11 g/dL — ABNORMAL LOW (ref 11.1–15.9)
Hemoglobin: 9.9 g/dL — ABNORMAL LOW (ref 11.1–15.9)
MCH: 22.3 pg — ABNORMAL LOW (ref 26.6–33.0)
MCH: 22.7 pg — ABNORMAL LOW (ref 26.6–33.0)
MCHC: 29.9 g/dL — ABNORMAL LOW (ref 31.5–35.7)
MCHC: 29.6 g/dL — ABNORMAL LOW (ref 31.5–35.7)
MCV: 75 fL — ABNORMAL LOW (ref 79–97)
MCV: 77 fL — ABNORMAL LOW (ref 79–97)
Platelets: 362 10*3/uL (ref 150–450)
Platelets: 328 10*3/uL (ref 150–450)
RBC: 4.93 x10E6/uL (ref 3.77–5.28)
RBC: 4.36 x10E6/uL (ref 3.77–5.28)
RDW: 25.4 % — ABNORMAL HIGH (ref 11.7–15.4)
WBC: 6.8 10*3/uL (ref 3.4–10.8)
WBC: 6.8 10*3/uL (ref 3.4–10.8)

## 2021-05-11 LAB — FERRITIN: Ferritin: 39 ng/mL (ref 15–150)

## 2021-05-12 ENCOUNTER — Encounter: Payer: Self-pay | Admitting: Family Medicine

## 2021-05-12 DIAGNOSIS — D649 Anemia, unspecified: Secondary | ICD-10-CM

## 2021-05-13 NOTE — Assessment & Plan Note (Signed)
Almost certainly related to large, submucosal fibroids. Recheck CBC and ferritin today after recent transfusion. Has had menses since last CBC check. Follow up with OB/GYN as scheduled Continue PO iron

## 2021-05-16 ENCOUNTER — Other Ambulatory Visit (HOSPITAL_COMMUNITY)
Admission: RE | Admit: 2021-05-16 | Discharge: 2021-05-16 | Disposition: A | Discharge status: Home / Self Care | Payer: Managed Care, Other (non HMO) | Source: Ambulatory Visit | Attending: Obstetrics and Gynecology | Admitting: Obstetrics and Gynecology

## 2021-05-16 ENCOUNTER — Ambulatory Visit: Payer: Managed Care, Other (non HMO) | Admitting: Obstetrics and Gynecology

## 2021-05-16 ENCOUNTER — Other Ambulatory Visit: Payer: Self-pay

## 2021-05-16 ENCOUNTER — Encounter: Payer: Self-pay | Admitting: Obstetrics and Gynecology

## 2021-05-16 VITALS — BP 122/77 | HR 72 | Wt 202.0 lb

## 2021-05-16 DIAGNOSIS — N920 Excessive and frequent menstruation with regular cycle: Secondary | ICD-10-CM | POA: Diagnosis not present

## 2021-05-16 DIAGNOSIS — D251 Intramural leiomyoma of uterus: Secondary | ICD-10-CM

## 2021-05-16 DIAGNOSIS — Z3202 Encounter for pregnancy test, result negative: Secondary | ICD-10-CM

## 2021-05-16 DIAGNOSIS — D250 Submucous leiomyoma of uterus: Secondary | ICD-10-CM | POA: Diagnosis not present

## 2021-05-16 DIAGNOSIS — R399 Unspecified symptoms and signs involving the genitourinary system: Secondary | ICD-10-CM | POA: Diagnosis not present

## 2021-05-16 DIAGNOSIS — D252 Subserosal leiomyoma of uterus: Secondary | ICD-10-CM

## 2021-05-16 LAB — POCT URINALYSIS DIP (DEVICE)
Bilirubin Urine: NEGATIVE
Glucose, UA: NEGATIVE mg/dL
Hgb urine dipstick: NEGATIVE
Ketones, ur: NEGATIVE mg/dL
Leukocytes,Ua: NEGATIVE
Nitrite: NEGATIVE
Protein, ur: NEGATIVE mg/dL
Specific Gravity, Urine: 1.025 (ref 1.005–1.030)
Urobilinogen, UA: 0.2 mg/dL (ref 0.0–1.0)
pH: 6 (ref 5.0–8.0)

## 2021-05-16 LAB — POCT PREGNANCY, URINE: Preg Test, Ur: NEGATIVE

## 2021-05-16 NOTE — Progress Notes (Signed)
  CC: heavy vaginal bleeding Subjective:    Patient ID: Adrienne Collins, female    DOB: 1971/03/09, 50 y.o.   MRN: [id number]  HPI 50 yo G2P2 seen for discussion of fibroid uterus and menorrhagia.  She has a history of heavy vaginal bleeding for 10 years with the last two years the bleeding has become worse.  Pt has had symptomatic anemia with at least one blood transfusion.  She has known fibroids and also notes some pressure symptoms.     Review of Systems     Objective:   Physical Exam Vitals:   05/16/21 1430  BP: 122/77  Pulse: 72   SVE: enlarged mobile uterus noted.  No discomfort during exam. ENDOMETRIAL BIOPSY      Adrienne Collins is a 50 y.o. G2P0 here for endometrial biopsy.  The indications for endometrial biopsy were reviewed.  Risks of the biopsy including cramping, bleeding, infection, uterine perforation, inadequate specimen and need for additional procedures were discussed. The patient states she understands and agrees to undergo procedure today. Consent was signed. Time out was performed.   Indications: menorrhagia Urine HCG: neg  A bivalve speculum was placed into the vagina and the cervix was easily visualized and was prepped with Betadine x2. A single-toothed tenaculum was placed on the anterior lip of the cervix to stabilize it. The 3 mm pipelle was introduced into the endometrial cavity without difficulty to a depth of 8 cm, and a moderate amount of tissue was obtained and sent to pathology. This was repeated for a total of 2 passes. The instruments were removed from the patient's vagina. Minimal bleeding from the cervix at the tenaculum was noted.   The patient tolerated the procedure well. Routine post-procedure instructions were given to the patient.    Will base further management on results of biopsy.      Assessment & Plan:   1. Menorrhagia with regular cycle Discussed treatment options with the patient including UFE, Sonata procedure or hysterectomy.   After performing the endometrial biopsy there is some concern if  the probe would be able to pass through such a small cervix.  UFE may be the better option for this patient.  If she is not a candidate  for the procedure or it is ineffective, hysterectomy would be the last option. Endometrial bx results are pending.  - Ambulatory referral to Interventional Radiology - Surgical pathology( [place])  2. Intramural, submucous, and subserous leiomyoma of uterus As above - Ambulatory referral to Interventional Radiology  3. UTI symptoms  - Urine Culture  Will follow up as needed.  Griffin Basil, MD Faculty Attending, [facility]

## 2021-05-18 LAB — SURGICAL PATHOLOGY

## 2021-05-18 LAB — URINE CULTURE

## 2021-05-29 ENCOUNTER — Other Ambulatory Visit: Payer: Self-pay

## 2021-05-29 DIAGNOSIS — K635 Polyp of colon: Secondary | ICD-10-CM | POA: Insufficient documentation

## 2021-05-29 DIAGNOSIS — M1990 Unspecified osteoarthritis, unspecified site: Secondary | ICD-10-CM | POA: Insufficient documentation

## 2021-05-29 DIAGNOSIS — D649 Anemia, unspecified: Secondary | ICD-10-CM | POA: Insufficient documentation

## 2021-06-12 ENCOUNTER — Ambulatory Visit: Payer: Managed Care, Other (non HMO) | Admitting: Cardiology

## 2021-07-19 ENCOUNTER — Other Ambulatory Visit: Payer: Self-pay

## 2021-07-19 ENCOUNTER — Ambulatory Visit: Admission: RE | Admit: 2021-07-19 | Payer: Managed Care, Other (non HMO) | Source: Ambulatory Visit

## 2021-08-09 ENCOUNTER — Other Ambulatory Visit: Payer: Self-pay | Admitting: Obstetrics and Gynecology

## 2021-08-09 DIAGNOSIS — D259 Leiomyoma of uterus, unspecified: Secondary | ICD-10-CM

## 2021-08-11 ENCOUNTER — Other Ambulatory Visit: Payer: Self-pay

## 2021-08-11 ENCOUNTER — Ambulatory Visit: Admission: RE | Admit: 2021-08-11 | Payer: Managed Care, Other (non HMO) | Source: Ambulatory Visit

## 2021-08-14 ENCOUNTER — Ambulatory Visit
Admission: RE | Admit: 2021-08-14 | Discharge: 2021-08-14 | Disposition: A | Discharge status: Home / Self Care | Payer: Managed Care, Other (non HMO) | Source: Ambulatory Visit | Attending: Obstetrics and Gynecology | Admitting: Obstetrics and Gynecology

## 2021-08-14 ENCOUNTER — Encounter: Payer: Self-pay | Admitting: *Deleted

## 2021-08-14 DIAGNOSIS — D259 Leiomyoma of uterus, unspecified: Secondary | ICD-10-CM

## 2021-08-14 HISTORY — PX: IR RADIOLOGIST EVAL & MGMT: IMG5224

## 2021-08-14 NOTE — H&P (Signed)
Interventional Radiology - Clinic Visit, Initial H&P    Referring Provider (current admission): Griffin Basil, MD  Reason for Visit: Uterine fibroids    History of Present Illness  Adrienne Collins is a 51 y.o. female with a relevant past medical history of HTN seen today in Interventional Radiology clinic for further evaluation and management of abnormal uterine bleeding in the setting of uterine fibroids.  Patient reports worsening of both irregular and heavy uterine bleeding over the past number of years, resulting in her needing a blood transfusion 3-4 months ago.  She reports long periods lasting up to 12-13 days most recently, although typically 7-10 days, which are heavy and passes clots. Sometimes her menstrual cycle is irregular and can have x2 menstruations within the span of a month. Additionally she also reports urinary symptoms including incomplete emptying/frequency/discomfort, and states she often has to shift around on the toilet seat to empty her bladder comfortably. She reports intermittent constipation, but mostly attributes that to the iron pills she is taking.   She discussed treatment options with Dr. Elgie Congo, and she is interested in [place] due to shorter recovery time and able to go back to work sooner.   Otherwise she is on her normal states of health. She has had 2 C-sections and reports being family complete.    Additional Past Medical History Past Medical History:  Diagnosis Date   Abnormal screening mammogram 07/01/2013   01/2013   Anemia    Anemia, iron deficiency 11/18/2006   Qualifier: History of  By: Walker Kehr MD, [place]     Arthritis    Breast pain, left 11/28/2014   Carpal tunnel syndrome 07/20/2014   Cervical cancer screening 06/14/2020   CERVICAL RADICULOPATHY, RIGHT 04/26/2008   Qualifier: Diagnosis of  By: Walker Kehr MD, [place]     Chest tightness 02/11/2012   At rest   Chronic right hip pain 01/11/2016   Colon polyps    Constipation 11/02/2014   Fatigue 06/02/2015    Fibroid uterus 04/26/2021   Generalized joint pain 01/27/2007   Acetaminophen for daily pain, Short course of Ibuprofen for more severe flare.     History of colonic polyps 06/07/2016   Colonoscopy 2010 Hepatic flexure sessile polyp measuring 1.0 cm Transverse colon sessile polyp measuring 4 mm   History of ETT 08/2006   no sustained tachycardia   Hypertension    Left arm pain 11/28/2014   Left knee pain 01/10/2017   Left shoulder pain 01/11/2016   LVH (left ventricular hypertrophy) 06/07/2016   EKG 06/07/16   Menorrhagia 04/21/2012   Morbid obesity ([place]) 10/03/2014   2015  BMI 36 2017  BMI 37  05/2016 ASCVD 2.4%   Neuropathy 11/25/2017   OSA (obstructive sleep apnea) 03/20/2016   Sleep study 07/2015 showed mild OSA. No CPAP recommended.    Palpitations 12/03/2017   Prediabetes    Preventative health care 10/03/2014   Right shoulder pain 08/23/2020   Rotator cuff impingement syndrome 08/17/2018   Shortness of breath 11/25/2017   Unspecified vitamin D deficiency 12/09/2012   Level 23 on 12/08/12 and Vitamin D3 1000-2000 IU daily was recommended     Surgical History  Past Surgical History:  Procedure Laterality Date   [place]   IR RADIOLOGIST EVAL & MGMT  08/14/2021   SALPINGECTOMY Right      Medications  I have reviewed the current medication list. Refer to chart for details. Current Outpatient Medications  Medication Instructions  acetaminophen (TYLENOL) 1,300 mg, Oral, Every 8 hours PRN, take 2 tabs every 6 hours as needed for pain   Enalapril-hydroCHLOROthiazide 5-12.5 MG tablet 1 tablet, Oral, Daily   ferrous sulfate 325 mg, Oral, 3 times daily with meals   metoprolol succinate (TOPROL-XL) 50 mg, Oral, Daily   polyethylene glycol (MIRALAX / GLYCOLAX) 17 g, Oral, 2 times daily PRN      Allergies No Known Allergies Does patient have contrast allergy: No     Physical Exam Current Vitals   ( )   Pulse Rate: 79       BP: 137/75   SpO2: 98 %            There is no height or weight on file to calculate BMI.  General: Alert and answers questions appropriately. No apparent distress. HEENT: Normocephalic, atraumatic. Conjunctivae normal without scleral icterus. Cardiac: Regular rate. No dependent edema. Pulmonary: Normal work of breathing. On room air. Abdominal: Soft without distension. Extremities: Normally-formed, well perfused.    Pertinent Lab Results CBC Latest Ref Rng & Units 05/10/2021 04/30/2021 04/05/2021  WBC 3.4 - 10.8 x10E3/uL 6.8 6.8 5.8  Hemoglobin 11.1 - 15.9 g/dL 9.9(L) 11.0(L) 6.6(LL)  Hematocrit 34.0 - 46.6 % 33.5(L) 36.8 24.0(L)  Platelets 150 - 450 x10E3/uL 328 362 421   CMP Latest Ref Rng & Units 05/10/2021 04/30/2021 06/06/2020  Glucose 70 - 99 mg/dL 130(H) CANCELED 120(H)  BUN 6 - 24 mg/dL 9 CANCELED 10  Creatinine 0.57 - 1.00 mg/dL 0.61 CANCELED 0.60  Sodium 134 - 144 mmol/L 137 CANCELED 137  Potassium 3.5 - 5.2 mmol/L 4.5 CANCELED 4.5  Chloride 96 - 106 mmol/L 100 CANCELED 103  CO2 20 - 29 mmol/L 21 CANCELED 20  Calcium 8.7 - 10.2 mg/dL 9.2 CANCELED 9.1  Total Protein 6.0 - 8.5 g/dL - - 7.5  Total Bilirubin 0.0 - 1.2 mg/dL - - <0.2  Alkaline Phos 44 - 121 IU/L - - 98  AST 0 - 40 IU/L - - 19  ALT 0 - 32 IU/L - - 19      Relevant and/or Recent Imaging: [place] Sept 2022 - leiomoyomatous uterus, largest fibroid 7 cm    Assessment & Plan  Adrienne Collins is a 51 y.o. female presenting to IR clinic for discussion of symptomatic uterine fibroids.  Her primary symptoms are abnormal uterine bleeding and urinary symptoms.  The patient was counseled on options for treatment of uterine fibroids with their accompanying symptoms of heavy menses, painful periods, and/or bulk symptoms including doing nothing, hormonal therapy, myomectomy, hysterectomy, and uterine artery embolization.  We discussed uterine artery embolization ([place]) as a potential therapy.  We described the procedure itself, including the possibility  of using analgesia for pain control, a bladder catheter, and possible need for admission to the hospital for a 23 hour inpatient observation period.  The procedure is done under conscious sedation.  We quoted an efficacy of 90% for improvement of menorrhagia and of 80% for improvement of bulk symptoms at one year related to fibroids.  We informed the patient that approximately 80% of those patients described sustained benefits from the procedure at 5 years.    We also quoted a 1 in 250-500 (0.2-0.4%) incidence of requiring emergent or semi-emergent hysterectomy due a procedural complication.  We also discussed the risk of an occult malignancy in patients with symptomatic fibroids being anywhere from 1 in 350 to 1 in 8,000 (0.0125-0.3%), depending upon the patient's age, symptom complex, family risk factors, and  appearance of the fibroid on her MRI study.  We also briefly discussed surgical options of treatment offered by our Gynecology colleagues with hysterectomy, myomectomy and/or endometrial ablation.    After her visit today she is most interested in [place].   Plan:  MRI pelvis with contrast  [place] pending MRI results    Pre-procedure planning:  Post-procedure disposition: outpatient   Sedation plan: conscious sedation  Medication holds: none   Positioning/access site: plan for radial access  Foley catheter needed: Yes (consider placement after sedation per pt request) Contrast premedication: No Labs needed on or before procedure day: CBC, BMP  Immediately prior to procedure: Ancef 2 g IV, Zofran 8 mg IV, Dilaudid 1 mg IV, Tylenol 1 g PO     I spent a total of  40 Minutes in face-to-face in clinical consultation, greater than 50% of which was spent on medical decision-making and counseling/coordinating care for uterine fibroids.     Albin Felling, MD  Vascular and Interventional Radiology 08/14/2021 4:23 PM

## 2021-08-15 ENCOUNTER — Other Ambulatory Visit: Payer: Self-pay | Admitting: Obstetrics and Gynecology

## 2021-08-16 ENCOUNTER — Other Ambulatory Visit: Payer: Self-pay | Admitting: Obstetrics and Gynecology

## 2021-08-16 ENCOUNTER — Other Ambulatory Visit: Payer: Self-pay | Admitting: Interventional Radiology

## 2021-08-16 DIAGNOSIS — D259 Leiomyoma of uterus, unspecified: Secondary | ICD-10-CM

## 2021-08-21 ENCOUNTER — Other Ambulatory Visit: Payer: Self-pay | Admitting: Obstetrics and Gynecology

## 2021-08-21 DIAGNOSIS — D259 Leiomyoma of uterus, unspecified: Secondary | ICD-10-CM

## 2021-09-03 ENCOUNTER — Other Ambulatory Visit: Payer: Self-pay

## 2021-09-03 ENCOUNTER — Ambulatory Visit
Admission: RE | Admit: 2021-09-03 | Discharge: 2021-09-03 | Disposition: A | Discharge status: Home / Self Care | Payer: Managed Care, Other (non HMO) | Source: Ambulatory Visit | Attending: Obstetrics and Gynecology | Admitting: Obstetrics and Gynecology

## 2021-09-03 DIAGNOSIS — D259 Leiomyoma of uterus, unspecified: Secondary | ICD-10-CM

## 2021-09-03 MED ORDER — GADOBENATE DIMEGLUMINE 529 MG/ML IV SOLN
19.0000 mL | Freq: Once | INTRAVENOUS | Status: AC | PRN
  Administered 2021-09-03: 19 mL via INTRAVENOUS

## 2021-09-04 NOTE — Progress Notes (Signed)
° ° °  SUBJECTIVE:   CHIEF COMPLAINT / HPI:   Memory complaint: patient has noticed increasing instances of memory troubles. For example, she will sometimes have trouble thinking of the correct word for a common object, e.g., calling a phone a 'comb' or a key fob a 'phone fob,' etc. She has no problem with her IADLs, she does her bills, shops, cooks food, etc. She also does all of her ADLs. Notably, she has a history of fe deficiency anemia and has been prescribed iron, but has trouble with adherence due to remembering medication. She denies any history of trauma, falls, she does have a headache that has been happening every day this week. She reports cutting back on coca-cola this week and she has had a diffuse, dull, ache that comes on insidiously during the day. She has tried having a few sips of coca-cola or mountain dew with no improvement in headache. This is not the worst headache of her life, no vision changes, no arm or leg weakness, no dysarthria.   PERTINENT  PMH / PSH: Anemia, prediabetes, HTN, class III obesity  OBJECTIVE:   BP 122/66    Pulse 71    Wt 202 lb (91.6 kg)    LMP 08/29/2021    SpO2 99%    BMI 38.17 kg/m   Nursing note and vitals reviewed GEN: middle-aged AAW, resting comfortably in chair, NAD, class II obesity HEENT: NCAT. No papilledema on fundoscopy. Sclera without injection or icterus. MMM. Clear oropharynx. Nares clear. Neck: Supple. No LAD.  Cardiac: Regular rate and rhythm. Normal S1/S2. No murmurs, rubs, or gallops appreciated. 2+ radial pulses. Lungs: Clear bilaterally to ascultation. No increased WOB, no accessory muscle usage. No w/r/r. Neuro: Cranial Nerves II: PERRL.  III,IV, VI: EOMI without ptosis or diplopia.  V: Facial sensation is symmetric to touch VII: Facial movement is symmetric.  VIII: Hearing is intact to voice X: Palate elevates symmetrically XI: Shoulder shrug is symmetric. XII: Tongue is midline without atrophy or fasciculations.  Motor:  Tone is normal. Bulk is normal. 5/5 strength was present in all four extremities. Gait normal. Sensory: Sensation is symmetric to light touch in the arms and legs. Deep Tendon Reflexes: 2+ and symmetric in the biceps and patellae.  Ext: no edema Psych: Pleasant and appropriate  ASSESSMENT/PLAN:   Memory change MMSE 28 (-2 for reminding patient of 2 of the 3 recall items), otherwise normal score, so far functioning intact, this combined with no red flags, normal neuro exam makes most concerning diagnoses (stroke, SAH) far less likely. DDx includes a broad differential and MS/space occupying lesion are remote but possible. Will obtain labs for common causes of memory impairment: HIV, RPR, TSH, Vit B12. Headache seems more like tension headache- discussed supportive care and return precautions. Follow up to be determined by lab results, imaging not necessary today, will consider based on lab results.  Anemia, iron deficiency Patient has difficulties with adherence to iron. Will recheck CBC, ferritin, iron/TIBC.     Gladys Damme, MD [place]

## 2021-09-05 ENCOUNTER — Other Ambulatory Visit: Payer: Self-pay

## 2021-09-05 ENCOUNTER — Encounter: Payer: Self-pay | Admitting: Family Medicine

## 2021-09-05 ENCOUNTER — Ambulatory Visit: Payer: Managed Care, Other (non HMO) | Admitting: Family Medicine

## 2021-09-05 VITALS — BP 122/66 | HR 71 | Wt 202.0 lb

## 2021-09-05 DIAGNOSIS — D508 Other iron deficiency anemias: Secondary | ICD-10-CM | POA: Diagnosis not present

## 2021-09-05 DIAGNOSIS — R413 Other amnesia: Secondary | ICD-10-CM

## 2021-09-05 HISTORY — DX: Other amnesia: R41.3

## 2021-09-05 NOTE — Patient Instructions (Signed)
It was a pleasure to see you today!  We will get some labs today.  If they are abnormal or we need to do something about them, I will call you.  If they are normal, I will send you a message on MyChart (if it is active) or a letter in the mail.  If you don't hear from [place] in 2 weeks, please call the office  [phone]. Follow up will be based on labs. If you have a terrible headache come on suddenly ("worst headache of your life") please go to the emergency dept. For regular dull headache, take tylenol or ibuprofen per instructions on bottle and be sure to drink plenty of water. Drink at least some caffeine during the day to avoid caffeine withdrawal headache. Also you can try warm compress or massage for tight neck muscles.  Be Well,  Dr. Chauncey Reading

## 2021-09-05 NOTE — Assessment & Plan Note (Signed)
Patient has difficulties with adherence to iron. Will recheck CBC, ferritin, iron/TIBC.

## 2021-09-05 NOTE — Assessment & Plan Note (Signed)
MMSE 28 (-2 for reminding patient of 2 of the 3 recall items), otherwise normal score, so far functioning intact, this combined with no red flags, normal neuro exam makes most concerning diagnoses (stroke, SAH) far less likely. DDx includes a broad differential and MS/space occupying lesion are remote but possible. Will obtain labs for common causes of memory impairment: HIV, RPR, TSH, Vit B12. Headache seems more like tension headache- discussed supportive care and return precautions. Follow up to be determined by lab results, imaging not necessary today, will consider based on lab results.

## 2021-09-06 LAB — IRON AND TIBC
Iron Saturation: 3 % — CL (ref 15–55)
Iron: 17 ug/dL — ABNORMAL LOW (ref 27–159)
Total Iron Binding Capacity: 488 ug/dL — ABNORMAL HIGH (ref 250–450)
UIBC: 471 ug/dL — ABNORMAL HIGH (ref 131–425)

## 2021-09-06 LAB — HIV ANTIBODY (ROUTINE TESTING W REFLEX): HIV Screen 4th Generation wRfx: NONREACTIVE

## 2021-09-06 LAB — CBC WITH DIFFERENTIAL/PLATELET
Basophils Absolute: 0.1 10*3/uL (ref 0.0–0.2)
Basos: 1 %
EOS (ABSOLUTE): 0.2 10*3/uL (ref 0.0–0.4)
Eos: 3 %
Hematocrit: 30.1 % — ABNORMAL LOW (ref 34.0–46.6)
Hemoglobin: 9.1 g/dL — ABNORMAL LOW (ref 11.1–15.9)
Immature Grans (Abs): 0 10*3/uL (ref 0.0–0.1)
Immature Granulocytes: 1 %
Lymphocytes Absolute: 1.9 10*3/uL (ref 0.7–3.1)
Lymphs: 30 %
MCH: 22.5 pg — ABNORMAL LOW (ref 26.6–33.0)
MCHC: 30.2 g/dL — ABNORMAL LOW (ref 31.5–35.7)
MCV: 74 fL — ABNORMAL LOW (ref 79–97)
Monocytes Absolute: 0.6 10*3/uL (ref 0.1–0.9)
Monocytes: 10 %
Neutrophils Absolute: 3.5 10*3/uL (ref 1.4–7.0)
Neutrophils: 55 %
Platelets: 405 10*3/uL (ref 150–450)
RBC: 4.05 x10E6/uL (ref 3.77–5.28)
RDW: 15.5 % — ABNORMAL HIGH (ref 11.7–15.4)
WBC: 6.3 10*3/uL (ref 3.4–10.8)

## 2021-09-06 LAB — RPR: RPR Ser Ql: NONREACTIVE

## 2021-09-06 LAB — FERRITIN: Ferritin: 11 ng/mL — ABNORMAL LOW (ref 15–150)

## 2021-09-06 LAB — VITAMIN B12: Vitamin B-12: 521 pg/mL (ref 232–1245)

## 2021-09-06 LAB — TSH: TSH: 0.955 u[IU]/mL (ref 0.450–4.500)

## 2021-09-11 NOTE — Addendum Note (Signed)
Addended by: Dene Gentry on: 09/11/2021 11:23 AM   Modules accepted: Orders

## 2021-09-17 ENCOUNTER — Other Ambulatory Visit (HOSPITAL_COMMUNITY): Payer: Self-pay | Admitting: Interventional Radiology

## 2021-09-17 ENCOUNTER — Encounter: Payer: Self-pay | Admitting: Family Medicine

## 2021-09-17 DIAGNOSIS — D259 Leiomyoma of uterus, unspecified: Secondary | ICD-10-CM

## 2021-09-17 NOTE — Progress Notes (Signed)
Entered in error

## 2021-10-08 ENCOUNTER — Other Ambulatory Visit: Payer: Self-pay

## 2021-10-08 DIAGNOSIS — G8929 Other chronic pain: Secondary | ICD-10-CM

## 2021-10-08 NOTE — Progress Notes (Signed)
Pt is asking for mri order date be updated. She is going to call today to schedule appt.

## 2021-10-20 ENCOUNTER — Ambulatory Visit
Admission: RE | Admit: 2021-10-20 | Discharge: 2021-10-20 | Disposition: A | Discharge status: Home / Self Care | Payer: Managed Care, Other (non HMO) | Source: Ambulatory Visit | Attending: Family Medicine | Admitting: Family Medicine

## 2021-10-20 ENCOUNTER — Other Ambulatory Visit: Payer: Self-pay

## 2021-10-20 DIAGNOSIS — M25511 Pain in right shoulder: Secondary | ICD-10-CM

## 2021-10-20 DIAGNOSIS — G8929 Other chronic pain: Secondary | ICD-10-CM

## 2021-10-30 ENCOUNTER — Other Ambulatory Visit: Payer: Self-pay

## 2021-10-30 ENCOUNTER — Encounter: Payer: Self-pay | Admitting: Family Medicine

## 2021-10-30 ENCOUNTER — Ambulatory Visit: Payer: Managed Care, Other (non HMO) | Admitting: Family Medicine

## 2021-10-30 VITALS — BP 122/65 | HR 89 | Wt 205.0 lb

## 2021-10-30 DIAGNOSIS — R0981 Nasal congestion: Secondary | ICD-10-CM

## 2021-10-30 DIAGNOSIS — G44219 Episodic tension-type headache, not intractable: Secondary | ICD-10-CM

## 2021-10-30 MED ORDER — CETIRIZINE HCL 10 MG PO TABS: 10.0000 mg | ORAL_TABLET | Freq: Every day | ORAL | 0 refills | Status: AC

## 2021-10-30 MED ORDER — FLUTICASONE PROPIONATE 50 MCG/ACT NA SUSP: 2.0000 | Freq: Every day | NASAL | 6 refills | Status: DC

## 2021-10-30 NOTE — Patient Instructions (Signed)
It was wonderful to see you today. ? ?Please bring ALL of your medications with you to every visit.  ? ?Today we talked about: ? ?Sleep apnea and nasal congestion being the source of ongoing headache.  I have sent you for sleep study they will call you within a week to get this scheduled.  Please pick up Flonase and Zyrtec from the pharmacy.  Follow-up if headaches continue to occur or worsen. ? ?Please be sure to schedule follow up at the front  desk before you leave today.  ? ?If you haven't already, sign up for My Chart to have easy access to your labs results, and communication with your primary care physician. ? ?Please call the clinic at [phone] if your symptoms worsen or you have any concerns. It was our pleasure to serve you. ? ?Dr. Janus Molder ? ?

## 2021-10-30 NOTE — Progress Notes (Signed)
? ? ?  SUBJECTIVE:  ? ?CHIEF COMPLAINT / HPI:  ? ?Headaches ?Experiencing morning headaches for 2 weeks. Has headache either immediately or 1 hour after waking up. This morning she tried advil with complete relief. It is sometimes accompanied with dizziness but not loss of normal gait. Also noticed she will name objects something they are not but she knows what they are. Worse when bending down to get something and is throbbing in nature. Endorses history of sinus congestion, rhinorrhea, and nasal congestion. Also has history of mild OSA. States she was told she did not need a breathing machine to sleep and this was about 5 years ago. She is not taking anything for this. She only takes medication for her blood pressure and history of fast heart rate. Denies hx of headaches and atrial fibrillation. Denies fever, weight loss, night sweats, changes in vision or hearing.  ?  ?PERTINENT  PMH / PSH: OSA, LVH, HTN, morbid obesity ? ?OBJECTIVE:  ? ?BP 122/65   Pulse 89   Wt 205 lb (93 kg)   SpO2 99%   BMI 38.73 kg/m?   ?Physical Exam ?Vitals reviewed.  ?Constitutional:   ?   General: She is not in acute distress. ?   Appearance: She is not ill-appearing, toxic-appearing or diaphoretic.  ?HENT:  ?   Head: Normocephalic.  ?Eyes:  ?   General: No scleral icterus. ?   Extraocular Movements: Extraocular movements intact.  ?   Pupils: Pupils are equal, round, and reactive to light. Pupils are equal.  ?Cardiovascular:  ?   Rate and Rhythm: Normal rate and regular rhythm.  ?   Heart sounds: Normal heart sounds.  ?Pulmonary:  ?   Effort: Pulmonary effort is normal.  ?   Breath sounds: Normal breath sounds.  ?Neurological:  ?   Mental Status: She is alert and oriented to person, place, and time.  ?   Cranial Nerves: No cranial nerve deficit or facial asymmetry.  ?   Sensory: No sensory deficit.  ?   Motor: No weakness.  ?   Coordination: Coordination normal.  ?   Gait: Gait normal.  ?Psychiatric:     ?   Mood and Affect: Mood  normal.     ?   Speech: Speech normal.     ?   Behavior: Behavior normal.  ? ?ASSESSMENT/PLAN:  ? ?1. Episodic tension-type headache, not intractable ?Morning headaches for 2 weeks. No associated vomiting or vision changes. She is neurologically appropriate and negative red flags at this time. Could consider CT head imaging in the future. For now suspect worsening OSA v. Sinus congestion. Will obtain updated sleep study and treat congestion. If symptoms fail to improve or worsening consider CT and labs.  ?- Nocturnal polysomnography (NPSG); Future ? ?2. Nasal congestion ?- fluticasone (FLONASE) 50 MCG/ACT nasal spray; Place 2 sprays into both nostrils daily.  Dispense: 16 g; Refill: 6 ?- cetirizine (ZYRTEC ALLERGY) 10 MG tablet; Take 1 tablet (10 mg total) by mouth daily.  Dispense: 90 tablet; Refill: 0 ? ?Novelle Addair Autry-Lott, DO ?[place]  ?

## 2021-11-08 ENCOUNTER — Emergency Department (HOSPITAL_BASED_OUTPATIENT_CLINIC_OR_DEPARTMENT_OTHER): Payer: Managed Care, Other (non HMO)

## 2021-11-08 ENCOUNTER — Encounter (HOSPITAL_BASED_OUTPATIENT_CLINIC_OR_DEPARTMENT_OTHER): Payer: Self-pay

## 2021-11-08 ENCOUNTER — Emergency Department (HOSPITAL_BASED_OUTPATIENT_CLINIC_OR_DEPARTMENT_OTHER)
Admission: EM | Admit: 2021-11-08 | Discharge: 2021-11-08 | Disposition: A | Discharge status: Home / Self Care | Payer: Managed Care, Other (non HMO) | Attending: Emergency Medicine | Admitting: Emergency Medicine

## 2021-11-08 DIAGNOSIS — G4489 Other headache syndrome: Secondary | ICD-10-CM | POA: Diagnosis not present

## 2021-11-08 DIAGNOSIS — Z79899 Other long term (current) drug therapy: Secondary | ICD-10-CM | POA: Diagnosis not present

## 2021-11-08 DIAGNOSIS — R519 Headache, unspecified: Secondary | ICD-10-CM | POA: Diagnosis present

## 2021-11-08 LAB — CBC WITH DIFFERENTIAL/PLATELET
Abs Immature Granulocytes: 0.02 10*3/uL (ref 0.00–0.07)
Basophils Absolute: 0.1 10*3/uL (ref 0.0–0.1)
Basophils Relative: 1 %
Eosinophils Absolute: 0.3 10*3/uL (ref 0.0–0.5)
Eosinophils Relative: 4 %
HCT: 27 % — ABNORMAL LOW (ref 36.0–46.0)
Hemoglobin: 7.5 g/dL — ABNORMAL LOW (ref 12.0–15.0)
Immature Granulocytes: 0 %
Lymphocytes Relative: 22 %
Lymphs Abs: 1.9 10*3/uL (ref 0.7–4.0)
MCH: 19 pg — ABNORMAL LOW (ref 26.0–34.0)
MCHC: 27.8 g/dL — ABNORMAL LOW (ref 30.0–36.0)
MCV: 68.4 fL — ABNORMAL LOW (ref 80.0–100.0)
Monocytes Absolute: 0.9 10*3/uL (ref 0.1–1.0)
Monocytes Relative: 10 %
Neutro Abs: 5.5 10*3/uL (ref 1.7–7.7)
Neutrophils Relative %: 63 %
Platelets: 402 10*3/uL — ABNORMAL HIGH (ref 150–400)
RBC: 3.95 MIL/uL (ref 3.87–5.11)
RDW: 18.7 % — ABNORMAL HIGH (ref 11.5–15.5)
WBC: 8.7 10*3/uL (ref 4.0–10.5)
nRBC: 0 % (ref 0.0–0.2)

## 2021-11-08 LAB — BASIC METABOLIC PANEL
Anion gap: 10 (ref 5–15)
BUN: 11 mg/dL (ref 6–20)
CO2: 25 mmol/L (ref 22–32)
Calcium: 8.9 mg/dL (ref 8.9–10.3)
Chloride: 99 mmol/L (ref 98–111)
Creatinine, Ser: 0.64 mg/dL (ref 0.44–1.00)
GFR, Estimated: >60 mL/min (ref >60)
Glucose, Bld: 97 mg/dL (ref 70–99)
Potassium: 3.5 mmol/L (ref 3.5–5.1)
Sodium: 134 mmol/L — ABNORMAL LOW (ref 135–145)

## 2021-11-08 IMAGING — CT CT HEAD W/O CM
3 series · 14 of 45 positions shown, 16 images · non-contrast
Comparison: None.

CLINICAL DATA: Headache, behind right eye, visual disturbances



[Series 2: head wo · axial · 0.47mm/px · z∈[-56,+58]mm · 8 of 28 slices shown, 10 images]
[im 3/28  brain]
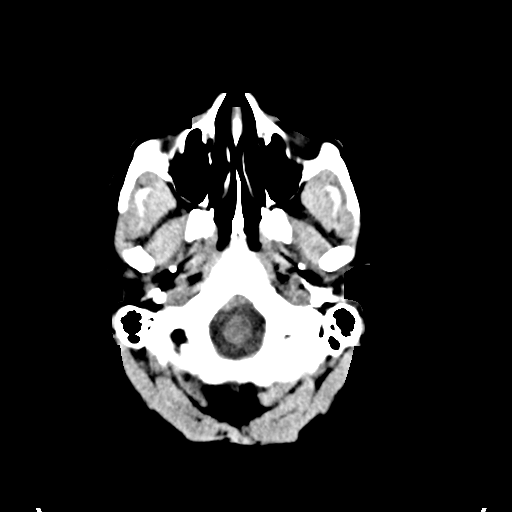
[im 3/28  bone]
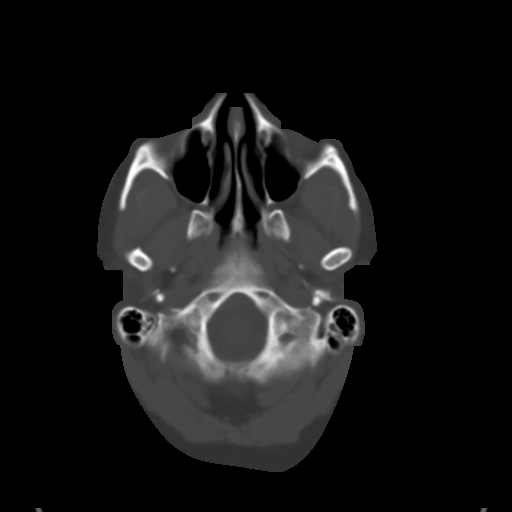
[im 6/28  brain]
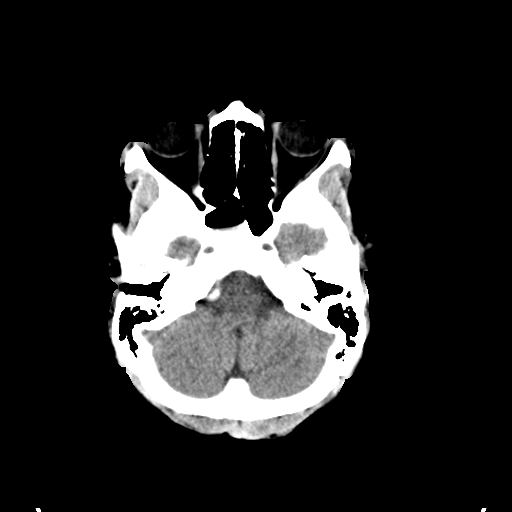
[im 10/28  brain]
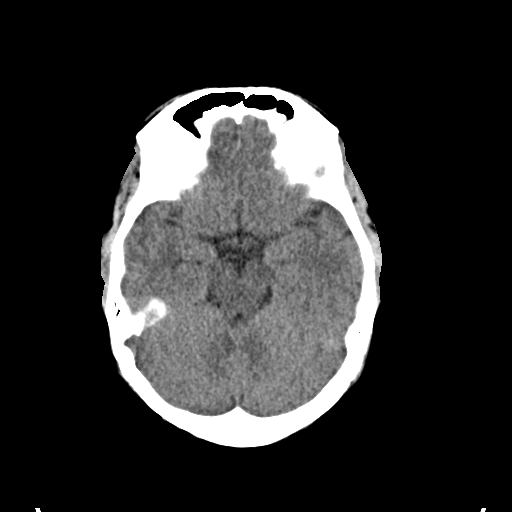
[im 13/28  brain]
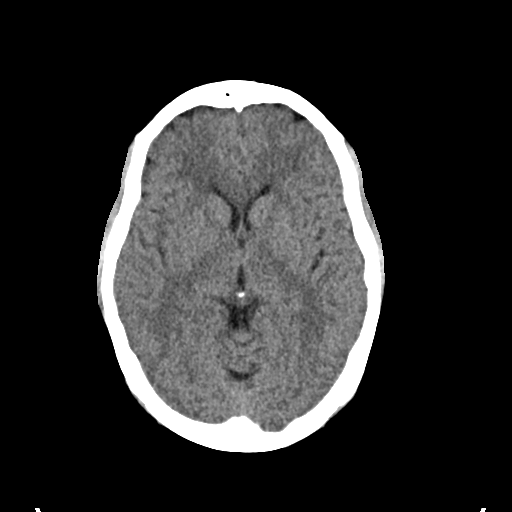
[im 16/28  brain]
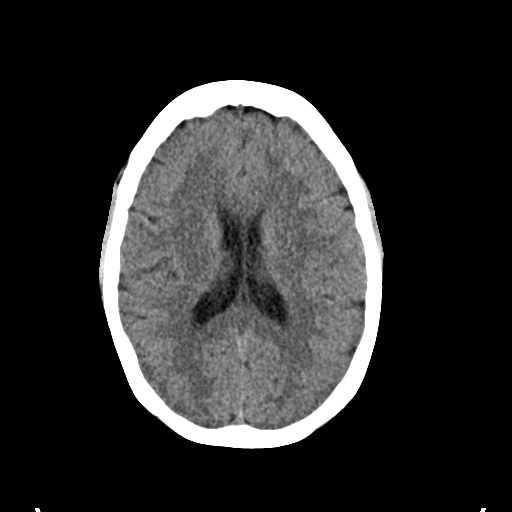
[im 16/28  bone]
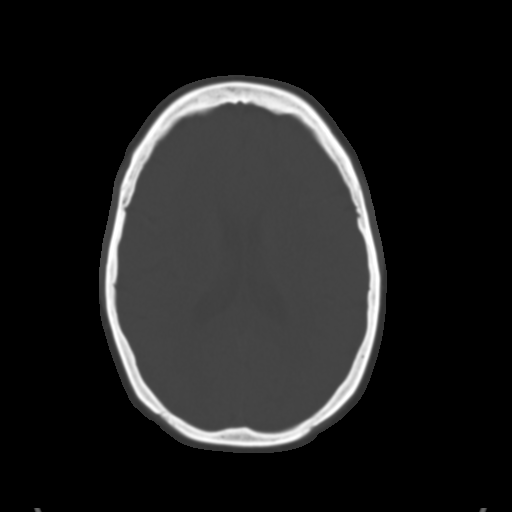
[im 19/28  brain]
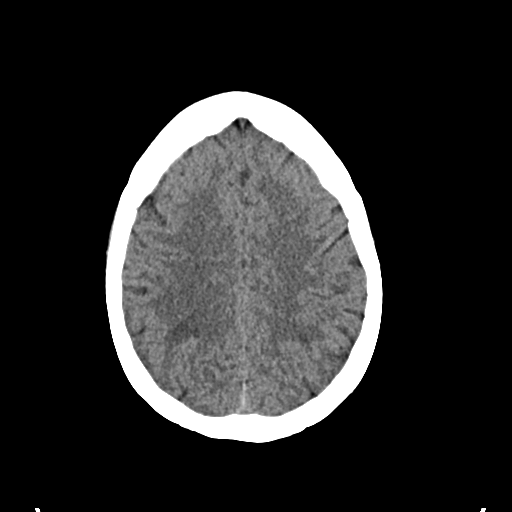
[im 23/28  brain]
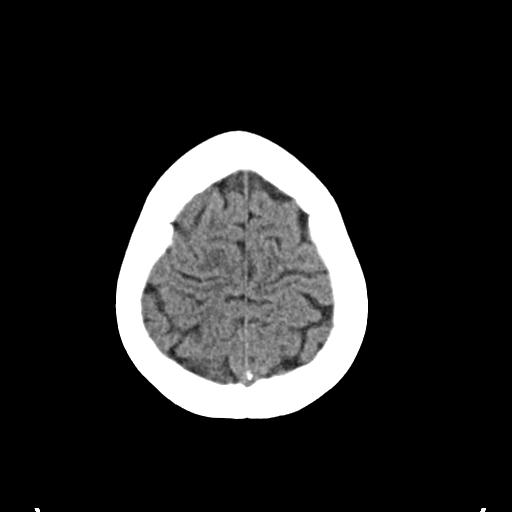
[im 26/28  brain]
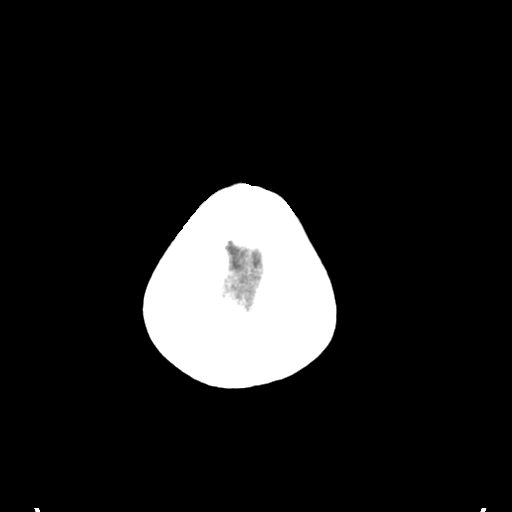

[Series 4: coronal soft · coronal · 0.30mm/px · 3 of 67 slices shown]
[im 23/67  brain]
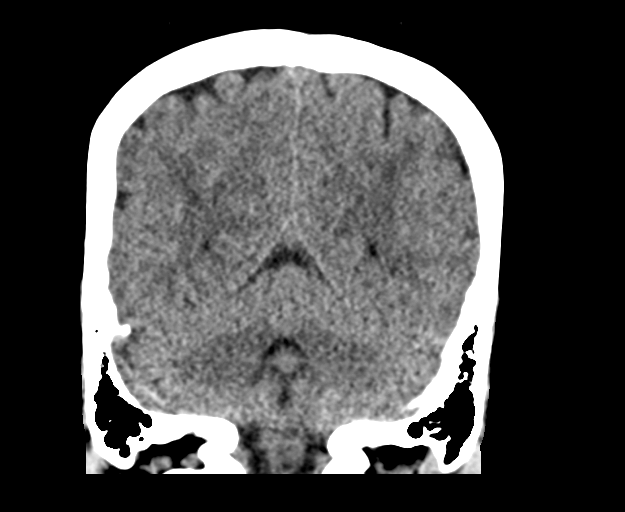
[im 30/67  brain]
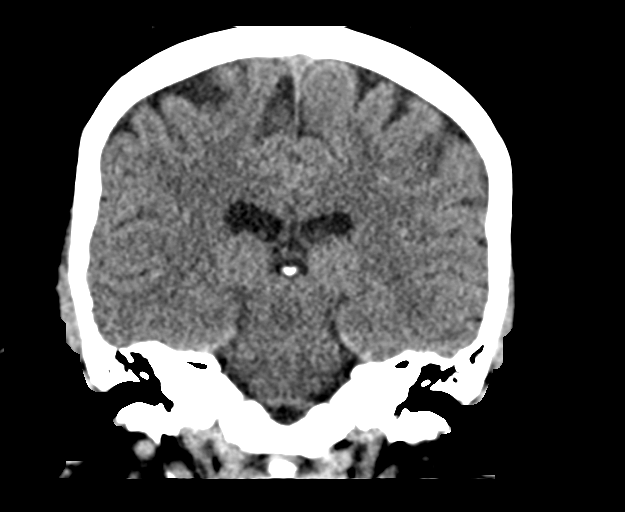
[im 37/67  brain]
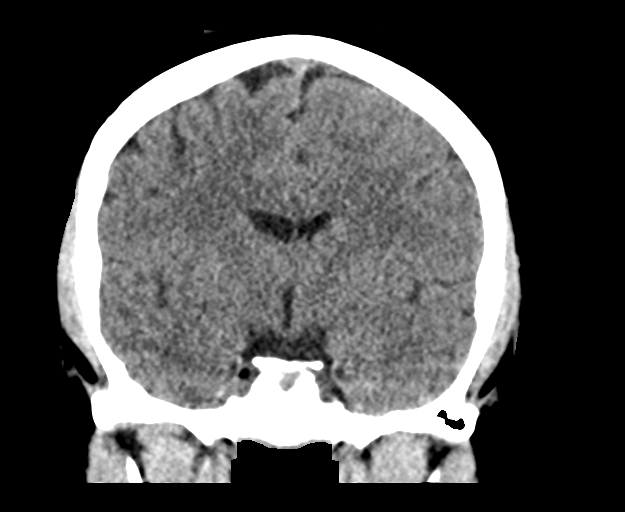

[Series 5: sag soft · sagittal · 0.26mm/px · 3 of 66 slices shown]
[im 22/66  brain]
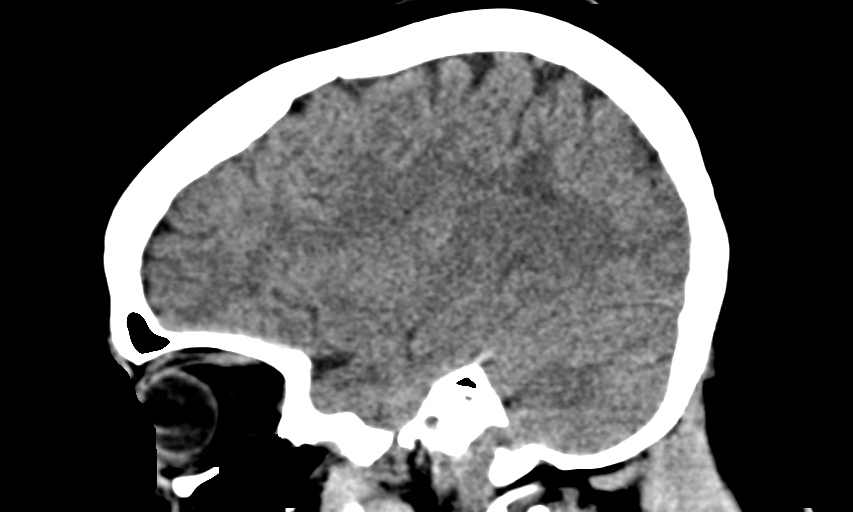
[im 33/66  brain]
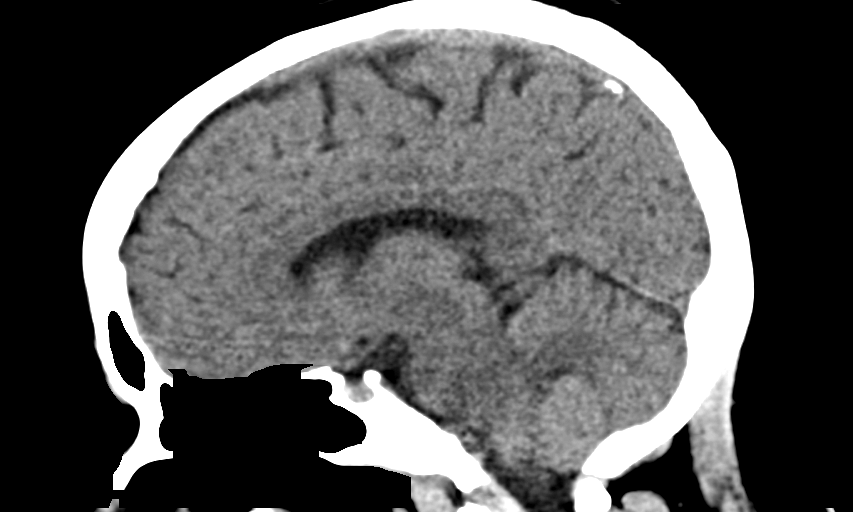
[im 44/66  brain]
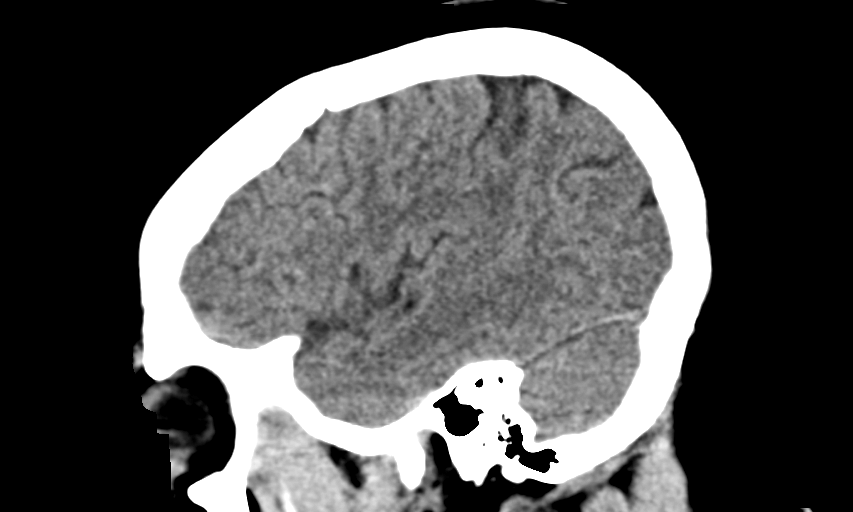

[14 of 45 positions shown; findings below may reference images not displayed]

FINDINGS: Brain: Area of hypodensity in the right parietal lobe (series 2,
image 19), which is nonspecific and favored to represent the sequela
of small vessel ischemic disease. No acute cortical infarct. No
acute hemorrhage, cerebral edema, mass, mass effect, or midline
shift. No hydrocephalus or extra-axial fluid collection.

Vascular: No hyperdense vessel.

Skull: Normal. Negative for fracture or focal lesion.

Sinuses/Orbits: Mucosal thickening in the right sphenoid sinus.
Otherwise negative.

Other: The mastoid air cells are well aerated.
IMPRESSION: IMPRESSION
No acute intracranial process.

## 2021-11-08 MED ORDER — KETOROLAC TROMETHAMINE 15 MG/ML IJ SOLN
15.0000 mg | Freq: Once | INTRAMUSCULAR | Status: AC
  Administered 2021-11-08: 15 mg via INTRAVENOUS
  Filled 2021-11-08: qty 1

## 2021-11-08 MED ORDER — LACTATED RINGERS IV BOLUS
1000.0000 mL | Freq: Once | INTRAVENOUS | Status: AC
  Administered 2021-11-08: 1000 mL via INTRAVENOUS

## 2021-11-08 MED ORDER — DIPHENHYDRAMINE HCL 50 MG/ML IJ SOLN: 25.0000 mg | Freq: Once | INTRAMUSCULAR | Status: DC

## 2021-11-08 MED ORDER — METOCLOPRAMIDE HCL 5 MG/ML IJ SOLN: 10.0000 mg | Freq: Once | INTRAMUSCULAR | Status: DC

## 2021-11-08 NOTE — ED Provider Notes (Signed)
?[facility] ?Provider Note ? ? ?CSN: [id number] ?Arrival date & time: 11/08/21  1651 ? ?  ? ?History ? ?Chief Complaint  ?Patient presents with  ? Headache  ? ? ?Adrienne Collins is a 51 y.o. female who presents to the ED for evaluation of frequent headaches that has been ongoing for a few weeks.  Patient saw her PCP regarding this issue 9 days ago who suggested that patient's headache pain is likely secondary to both allergies and OSA given that patient's symptoms are worse in the mornings.  Patient is pending OSA sleep study.  Patient was started on Zyrtec and Flonase and she was without a headache for 1 week until this morning.  What concerned her about this headache was that she had bright flashes of light for about 5 to 10 seconds in her right eye when she woke up to go to the bathroom in the middle of the night.  She went to bed and when she woke up in the morning, it happened again.  She notes that her headache is not "that bad" and describes it as dull and global, but was concerned about the bright flashes of light.  Pain is worsened with positional changes such as leaning forward or sneezing.  She did set up an appointment with an ophthalmologist for tomorrow but wanted to be evaluated today to ensure there is no emergent concern.  She denies current vision changes, chest pain, shortness of breath, nausea, vomiting, diarrhea and abdominal pain.  She also denies neuro symptoms including dizziness, slurred speech, unilateral weakness, numbness or tingling. ? ?Headache ? ?  ? ?Home Medications ?Prior to Admission medications   ?Medication Sig Start Date End Date Taking? Authorizing Provider  ?acetaminophen (TYLENOL) 650 MG CR tablet Take 2 tablets (1,300 mg total) by mouth every 8 (eight) hours as needed for pain. take 2 tabs every 6 hours as needed for pain 10/24/10   Candelaria Celeste, MD  ?cetirizine (ZYRTEC ALLERGY) 10 MG tablet Take 1 tablet (10 mg total) by mouth daily. 10/30/21    Autry-Lott, Naaman Plummer, DO  ?Enalapril-hydroCHLOROthiazide 5-12.5 MG tablet Take 1 tablet by mouth daily. 10/10/20   Leeanne Rio, MD  ?ferrous sulfate 325 (65 FE) MG EC tablet Take 325 mg by mouth 3 (three) times daily with meals.    [provider]  ?fluticasone (FLONASE) 50 MCG/ACT nasal spray Place 2 sprays into both nostrils daily. 10/30/21   Autry-Lott, Naaman Plummer, DO  ?metoprolol succinate (TOPROL-XL) 50 MG 24 hr tablet Take 50 mg by mouth daily.    [provider]  ?polyethylene glycol (MIRALAX / GLYCOLAX) 17 g packet Take 17 g by mouth 2 (two) times daily as needed for constipation.    [provider]  ?   ? ?Allergies    ?Patient has no known allergies.   ? ?Review of Systems   ?Review of Systems  ?Neurological:  Positive for headaches.  ? ?Physical Exam ?Updated Vital Signs ?BP (!) 141/71 (BP Location: Left Arm)   Pulse 78   Temp 98.4 ?F (36.9 ?C) (Oral)   Resp 16   Ht '5\' 1"'$  (1.549 m)   Wt 93 kg   LMP 11/04/2021 (Exact Date)   SpO2 99%   BMI 38.73 kg/m?  ?Physical Exam ?Vitals and nursing note reviewed.  ?Constitutional:   ?   General: She is not in acute distress. ?   Appearance: She is not ill-appearing.  ?HENT:  ?   Head: Atraumatic.  ?  Eyes:  ?   Extraocular Movements: Extraocular movements intact.  ?   Conjunctiva/sclera: Conjunctivae normal.  ?   Pupils: Pupils are equal, round, and reactive to light.  ?Cardiovascular:  ?   Rate and Rhythm: Normal rate and regular rhythm.  ?   Pulses: Normal pulses.  ?   Heart sounds: No murmur heard. ?Pulmonary:  ?   Effort: Pulmonary effort is normal. No respiratory distress.  ?   Breath sounds: Normal breath sounds.  ?Abdominal:  ?   General: Abdomen is flat. There is no distension.  ?   Palpations: Abdomen is soft.  ?   Tenderness: There is no abdominal tenderness.  ?Musculoskeletal:     ?   General: Normal range of motion.  ?   Cervical back: Normal range of motion.  ?Skin: ?   General: Skin is warm and dry.  ?   Capillary Refill:  Capillary refill takes less than 2 seconds.  ?Neurological:  ?   General: No focal deficit present.  ?   Mental Status: She is alert.  ?   Comments: Speech is clear, able to follow commands ?CN III-XII intact ?Normal strength in upper and lower extremities bilaterally including dorsiflexion and plantar flexion, strong and equal grip strength ?Sensation normal to light and sharp touch ?Moves extremities without ataxia, coordination intact ?Normal finger to nose and rapid alternating movements ?No pronator drift ? ?  ?Psychiatric:     ?   Mood and Affect: Mood normal.  ? ? ?ED Results / Procedures / Treatments   ?Labs ?(all labs ordered are listed, but only abnormal results are displayed) ?Labs Reviewed  ?BASIC METABOLIC PANEL - Abnormal; Notable for the following components:  ?    Result Value  ? Sodium 134 (*)   ? All other components within normal limits  ?CBC WITH DIFFERENTIAL/PLATELET - Abnormal; Notable for the following components:  ? Hemoglobin 7.5 (*)   ? HCT 27.0 (*)   ? MCV 68.4 (*)   ? MCH 19.0 (*)   ? MCHC 27.8 (*)   ? RDW 18.7 (*)   ? Platelets 402 (*)   ? All other components within normal limits  ? ? ?EKG ?None ? ?Radiology ?CT Head Wo Contrast ? ?Result Date: 11/08/2021 ?CLINICAL DATA:  Headache, behind right eye, visual disturbances EXAM: CT HEAD WITHOUT CONTRAST TECHNIQUE: Contiguous axial images were obtained from the base of the skull through the vertex without intravenous contrast. RADIATION DOSE REDUCTION: This exam was performed according to the departmental dose-optimization program which includes automated exposure control, adjustment of the mA and/or kV according to patient size and/or use of iterative reconstruction technique. COMPARISON:  None. FINDINGS: Brain: Area of hypodensity in the right parietal lobe (series 2, image 19), which is nonspecific and favored to represent the sequela of small vessel ischemic disease. No acute cortical infarct. No acute hemorrhage, cerebral edema, mass,  mass effect, or midline shift. No hydrocephalus or extra-axial fluid collection. Vascular: No hyperdense vessel. Skull: Normal. Negative for fracture or focal lesion. Sinuses/Orbits: Mucosal thickening in the right sphenoid sinus. Otherwise negative. Other: The mastoid air cells are well aerated. IMPRESSION: IMPRESSION No acute intracranial process. Electronically Signed   By: Merilyn Baba M.D.   On: 11/08/2021 20:13   ? ?Procedures ?Procedures  ? ? ?Medications Ordered in ED ?Medications  ?metoCLOPramide (REGLAN) injection 10 mg (10 mg Intravenous Not Given 11/08/21 2008)  ?diphenhydrAMINE (BENADRYL) injection 25 mg (25 mg Intravenous Not Given 11/08/21 2012)  ?ketorolac (TORADOL)  15 MG/ML injection 15 mg (15 mg Intravenous Given 11/08/21 2012)  ?lactated ringers bolus 1,000 mL (0 mLs Intravenous Stopped 11/08/21 2115)  ? ? ?ED Course/ Medical Decision Making/ A&P ?  ?                        ?Medical Decision Making ?Amount and/or Complexity of Data Reviewed ?Labs: ordered. ?Radiology: ordered. ? ?Risk ?Prescription drug management. ? ? ?History:  ?Per HPI ?Social determinants of health: none ? ?Initial impression: ? ?This patient presents to the ED for concern of headache, this involves an extensive number of treatment options, and is a complaint that carries with it a high risk of complications and morbidity.   Emergent considerations for headache include subarachnoid hemorrhage, meningitis, temporal arteritis, glaucoma, cerebral ischemia, carotid/vertebral dissection, intracranial tumor, Venous sinus thrombosis, carbon monoxide poisoning, acute or chronic subdural hemorrhage.  Other considerations include: Migraine, Cluster headache, Hypertension, Caffeine, alcohol, or drug withdrawal, Pseudotumor cerebri, Arteriovenous malformation, Head injury, Neurocysticercosis, Post-lumbar puncture, Preeclampsia, Tension headache, Sinusitis, Cervical arthritis, Refractive error causing strain, Dental abscess, Otitis media,  Temporomandibular joint syndrome, Depression, Somatoform disorder (eg, somatization) Trigeminal neuralgia, Glossopharyngeal neuralgia. ? ? ?Lab Tests and EKG: ? ?I Ordered, reviewed, and interpreted labs and EKG.

## 2021-11-08 NOTE — ED Notes (Signed)
Pt. Reports no double vision with headache.  She reports she had a flash of light in her R eye when she woke today.  Pt. Has had no memory issues today.  Pt. Drove here today.  Pt. Is going to the eye Dr.  [name].    Pt. Reports she has issues with anemia.   ?

## 2021-11-08 NOTE — Discharge Instructions (Signed)
Your CT and work-up today was very reassuring.  Fortunately there is no acute cause of your headaches and the flashing lights seen on your vision this morning.  I am glad that you are seeing a retinal specialist tomorrow in order to have this better evaluated.  I have also given you a neurology referral to help you figure out why you are having such frequent headaches. ? ?Return if you develop symptoms of stroke including slurred speech, unilateral weakness, imbalance or numbness and tingling. ?

## 2021-11-08 NOTE — ED Triage Notes (Addendum)
Pt has had headaches x several weeks, has been taking flonase and zyrtec with Improvement the past few days, states today woke up and had a bright light sensation in her right eye lasting a few seconds. Slight headache today. Denies other symptoms. No neuro deficits noted. ?

## 2021-11-08 NOTE — ED Notes (Signed)
Patient transported to CT 

## 2021-11-08 NOTE — ED Notes (Signed)
PA at bedside.

## 2021-11-08 NOTE — ED Notes (Signed)
Pt. Is driving and does not want anything to make her sleepy ?

## 2021-11-12 ENCOUNTER — Telehealth: Payer: Self-pay

## 2021-11-12 NOTE — Telephone Encounter (Signed)
Patient calls nurse line reporting ED visit on 3/30 and a Hgb of 7.5. ? ?Patient reports she has an upcoming procedure on 4/14 and was told "you may need a blood transfusion before procedure."  ? ?Patient denies any lightheadedness or dizziness. Patient has been taking iron supplements TID since April 1st.  ? ?Patient scheduled for tomorrow morning for evaluation and plan.  ? ?Red flags discussed with patient in the meantime.  ?

## 2021-11-12 NOTE — Telephone Encounter (Signed)
Agree with being seen ?Leeanne Rio, MD  ?

## 2021-11-13 ENCOUNTER — Encounter: Payer: Self-pay | Admitting: Student

## 2021-11-13 ENCOUNTER — Ambulatory Visit (INDEPENDENT_AMBULATORY_CARE_PROVIDER_SITE_OTHER): Payer: Managed Care, Other (non HMO) | Admitting: Student

## 2021-11-13 ENCOUNTER — Other Ambulatory Visit: Payer: Self-pay

## 2021-11-13 DIAGNOSIS — D508 Other iron deficiency anemias: Secondary | ICD-10-CM | POA: Diagnosis not present

## 2021-11-13 NOTE — Assessment & Plan Note (Signed)
-  Continue iron supplement 2-3 times a day, can use MiraLAX if constipated ?-Return in 1 month to recheck hemoglobin ?-Call physician who is completing procedure later this month to notify them of hemoglobin of 7.5 ?

## 2021-11-13 NOTE — Progress Notes (Signed)
? ? ?  SUBJECTIVE:  ? ?CHIEF COMPLAINT / HPI:  ? ?Iron deficiency anemia ?Went to ED on 3/30 for headaches (pt referred to neurology) and found to have hgb of 7.5. Has been taking ferrous sulfate '325mg'$  1-3 times per day for past 3 days. Was not taking regular prior to this. Has had 1 iron infusion several months ago and 1 blood transfusion 04/11/2021. Has history of heavy bleeding d/t uterine fibroids. Has uterine artery embolization procedure scheduled for 11/23/21. ? ?Pt admits to fatigue, has rapid heart rate but take metoprolol for this. Admits to occasional light headedness/dizziness but denies presyncope.  ? ? ?OBJECTIVE:  ? ?Vitals:  ? 11/13/21 0956  ?BP: (!) 145/76  ?Pulse: 93  ?SpO2: 100%  ? ? ? ?General: NAD, pleasant, able to participate in exam ?Cardiac: RRR, no murmurs. ?Respiratory: CTAB, normal effort, No wheezes, rales or rhonchi ?Neuro: alert, no obvious focal deficits ?Psych: Normal affect and mood ? ?ASSESSMENT/PLAN:  ? ?Iron deficiency anemia ?-Continue iron supplement 2-3 times a day, can use MiraLAX if constipated ?-Return in 1 month to recheck hemoglobin ?-Call physician who is completing procedure later this month to notify them of hemoglobin of 7.5 ?  ? ? ?Dr. Precious Gilding, DO ?[facility]  ? ? ? ? ?

## 2021-11-13 NOTE — Patient Instructions (Signed)
It was great to see you! Thank you for allowing me to participate in your care! ? ?I recommend that you always bring your medications to each appointment as this makes it easy to ensure you are on the correct medications and helps [place] not miss when refills are needed. ? ?Our plans for today:  ?-Continue taking your iron supplement 2-3 times per day.  You take MiraLAX if it causes constipation. ?-Return in about 1 month to recheck your iron, this can be a lab only visit and I will place the order now ?-I recommend calling your surgeon to let them know your hemoglobin is 7.5 prior to your procedure. ? ? ?Take care and seek immediate care sooner if you develop any concerns.  ? ?Dr. Precious Gilding, DO ?[facility] ? ?

## 2021-11-16 ENCOUNTER — Encounter: Payer: Self-pay | Admitting: Family Medicine

## 2021-11-16 DIAGNOSIS — G4733 Obstructive sleep apnea (adult) (pediatric): Secondary | ICD-10-CM

## 2021-11-19 ENCOUNTER — Telehealth: Payer: Self-pay | Admitting: *Deleted

## 2021-11-19 NOTE — Progress Notes (Signed)
? ?Referring:  ?Tonye Pearson, PA-C ?[address] ?[place],  [place] [address] ? ?PCP: ?Leeanne Rio, MD ? ?Neurology was asked to evaluate Adrienne Collins, a 51 year old female for a chief complaint of headaches.  Our recommendations of care will be communicated by shared medical record.   ? ?CC:  headaches ? ?History provided from self ? ?HPI:  ?Medical co-morbidities: HTN, OSA, rotator cuff tear, prediabetes, iron deficiency anemia ? ?The patient presents for evaluation of headaches which began 3 weeks ago. States she just woke up with a headache one day without any clear triggers. Headaches are described as occipital and temporal throbbing without associated photophobia, phonophobia, or nausea. They can last for several days at a time. She developed flashing lights in her right eye on 11/08/21, so she presented to the ED where [facility] showed an area of hypodensity in the right parietal lobe. This was nonspecific but favored to be from small vessel ischemic disease. She saw the ophthalmologist 11/09/21 who noted a normal eye exam. ? ?She notes she has not had any headaches for the past 3 days. She does have significant neck pain with burning and shooting pain down her left arm. ? ?She has a history of OSA and is currently working on getting a CPAP. ? ?Has a history of bad headaches several years ago. These were diagnosed as caffeine withdrawal headaches. ? ? ?Headache History: ?Onset: 3 weeks ago ?Triggers: none, wakes up with them ?Aura: flashing lights (blue and yellow spots lasting 4-5 seconds) ?Location: occiput, temple ?Quality/Description: throbbing ?Associated Symptoms: ? Photophobia: no ? Phonophobia: no ? Nausea: no ?Worse with activity?: yes ?Duration of headaches: days ? ?Headache days per month: 21 ?Headache free days per month: 9 ? ?Current Treatment: ?Abortive ?ibuprofen ? ?Preventative ?none ? ?Prior Therapies                                 ?Metoprolol 50 mg daily ? ? ?LABS: ?CBC ?    ?Component Value Date/Time  ? WBC 8.7 11/08/2021 1950  ? RBC 3.95 11/08/2021 1950  ? HGB 7.5 (L) 11/08/2021 1950  ? HGB 9.1 (L) 09/05/2021 1708  ? HCT 27.0 (L) 11/08/2021 1950  ? HCT 30.1 (L) 09/05/2021 1708  ? PLT 402 (H) 11/08/2021 1950  ? PLT 405 09/05/2021 1708  ? MCV 68.4 (L) 11/08/2021 1950  ? MCV 74 (L) 09/05/2021 1708  ? MCH 19.0 (L) 11/08/2021 1950  ? MCHC 27.8 (L) 11/08/2021 1950  ? RDW 18.7 (H) 11/08/2021 1950  ? RDW 15.5 (H) 09/05/2021 1708  ? LYMPHSABS 1.9 11/08/2021 1950  ? LYMPHSABS 1.9 09/05/2021 1708  ? MONOABS 0.9 11/08/2021 1950  ? EOSABS 0.3 11/08/2021 1950  ? EOSABS 0.2 09/05/2021 1708  ? BASOSABS 0.1 11/08/2021 1950  ? BASOSABS 0.1 09/05/2021 1708  ? ? ?  Latest Ref Rng & Units 11/08/2021  ?  7:50 PM 05/10/2021  ? 10:09 AM 04/30/2021  ? 11:41 AM  ?CMP  ?Glucose 70 - 99 mg/dL 97   130   CANCELED    ?BUN 6 - 20 mg/dL 11   9   CANCELED    ?Creatinine 0.44 - 1.00 mg/dL 0.64   0.61   CANCELED    ?Sodium 135 - 145 mmol/L 134   137   CANCELED    ?Potassium 3.5 - 5.1 mmol/L 3.5   4.5   CANCELED    ?Chloride 98 -  111 mmol/L 99   100   CANCELED    ?CO2 22 - 32 mmol/L 25   21   CANCELED    ?Calcium 8.9 - 10.3 mg/dL 8.9   9.2   CANCELED    ? ? ? ?IMAGING:  ?[place] 11/08/21: Area of hypodensity in the right parietal lobe (series 2, ?image 19), which is nonspecific and favored to represent the sequela ?of small vessel ischemic disease. ? ?Image independently reviewed 11/20/21 ? ?Current Outpatient Medications on File Prior to Visit  ?Medication Sig Dispense Refill  ? acetaminophen (TYLENOL) 650 MG CR tablet Take 2 tablets (1,300 mg total) by mouth every 8 (eight) hours as needed for pain. take 2 tabs every 6 hours as needed for pain 30 tablet prn  ? cetirizine (ZYRTEC ALLERGY) 10 MG tablet Take 1 tablet (10 mg total) by mouth daily. 90 tablet 0  ? Enalapril-hydroCHLOROthiazide 5-12.5 MG tablet Take 1 tablet by mouth daily. 90 tablet 3  ? ferrous sulfate 325 (65 FE) MG EC tablet Take 325 mg by mouth 3 (three) times  daily with meals.    ? fluticasone (FLONASE) 50 MCG/ACT nasal spray Place 2 sprays into both nostrils daily. 16 g 6  ? metoprolol succinate (TOPROL-XL) 50 MG 24 hr tablet Take 50 mg by mouth daily.    ? polyethylene glycol (MIRALAX / GLYCOLAX) 17 g packet Take 17 g by mouth 2 (two) times daily as needed for constipation.    ? ?No current facility-administered medications on file prior to visit.  ? ? ? ?Allergies: ?No Known Allergies ? ?Family History: ?Migraine or other headaches in the family:  no ?Aneurysms in a first degree relative:  no ?Brain tumors in the family:  no ?Other neurological illness in the family:   mother had strokes from afib, sister passed away from a stroke at 44 (hx of heavy drug use), brother had a stroke at a young age (hx heavy drug use and dialysis) ? ?Past Medical History: ?Past Medical History:  ?Diagnosis Date  ? Abnormal screening mammogram 07/01/2013  ? 01/2013  ? Anemia   ? Anemia, iron deficiency 11/18/2006  ? Qualifier: History of  By: Walker Kehr MD, Patrick Jupiter    ? Arthritis   ? Breast pain, left 11/28/2014  ? Carpal tunnel syndrome 07/20/2014  ? Cervical cancer screening 06/14/2020  ? CERVICAL RADICULOPATHY, RIGHT 04/26/2008  ? Qualifier: Diagnosis of  By: Walker Kehr MD, Patrick Jupiter    ? Chest tightness 02/11/2012  ? At rest  ? Chronic right hip pain 01/11/2016  ? Colon polyps   ? Constipation 11/02/2014  ? Fatigue 06/02/2015  ? Fibroid uterus 04/26/2021  ? Generalized joint pain 01/27/2007  ? Acetaminophen for daily pain, Short course of Ibuprofen for more severe flare.    ? History of colonic polyps 06/07/2016  ? Colonoscopy 2010 Hepatic flexure sessile polyp measuring 1.0 cm Transverse colon sessile polyp measuring 4 mm  ? History of ETT 08/2006  ? no sustained tachycardia  ? Hypertension   ? Left arm pain 11/28/2014  ? Left knee pain 01/10/2017  ? Left shoulder pain 01/11/2016  ? LVH (left ventricular hypertrophy) 06/07/2016  ? EKG 06/07/16  ? Menorrhagia 04/21/2012  ? Morbid obesity ([place]) 10/03/2014  ? 2015  BMI 36  2017  BMI 37  05/2016 ASCVD 2.4%  ? Neuropathy 11/25/2017  ? OSA (obstructive sleep apnea) 03/20/2016  ? Sleep study 07/2015 showed mild OSA. No CPAP recommended.   ? Palpitations 12/03/2017  ? Prediabetes   ?  Preventative health care 10/03/2014  ? Right shoulder pain 08/23/2020  ? Rotator cuff impingement syndrome 08/17/2018  ? Shortness of breath 11/25/2017  ? Unspecified vitamin D deficiency 12/09/2012  ? Level 23 on 12/08/12 and Vitamin D3 1000-2000 IU daily was recommended  ? ? ?Past Surgical History ?Past Surgical History:  ?Procedure Laterality Date  ? [place]  ? [place]  ? IR RADIOLOGIST EVAL & MGMT  08/14/2021  ? SALPINGECTOMY Right   ? ? ?Social History: ?Social History  ? ?Tobacco Use  ? Smoking status: Former  ?  Types: Cigarettes  ?  Quit date: 01/30/1994  ?  Years since quitting: 27.8  ? Smokeless tobacco: Never  ?Substance Use Topics  ? Alcohol use: No  ?  Comment: quit 1995  ? Drug use: No  ? ? ? ?ROS: ?Negative for fevers, chills. Positive for headaches. All other systems reviewed and negative unless stated otherwise in HPI. ? ? ?Physical Exam:  ? ?Vital Signs: ?BP 122/84   Pulse 72   Ht '5\' 1"'$  (1.549 m)   Wt 206 lb 8 oz (93.7 kg)   LMP 11/04/2021 (Exact Date)   SpO2 99%   BMI 39.02 kg/m?  ?GENERAL: well appearing,in no acute distress,alert ?SKIN:  Color, texture, turgor normal. No rashes or lesions ?HEAD:  Normocephalic/atraumatic. ?CV:  RRR ?RESP: Normal respiratory effort ?MSK: +tenderness to palpation over bilateral neck and shoulders ? ?NEUROLOGICAL: ?Mental Status: Alert, oriented to person, place and time,Follows commands ?Cranial Nerves: PERRL, visual fields intact to confrontation, extraocular movements intact, facial sensation intact, no facial droop or ptosis, hearing grossly intact, no dysarthria ?Motor: muscle strength 5/5 both upper and lower extremities ?Reflexes: 2+ throughout ?Sensation: intact to light touch all 4 extremities ?Coordination: Finger-to-  nose-finger intact bilaterally ?Gait: normal-based ? ? ?IMPRESSION: ?51 year old female with a history of HTN, OSA, rotator cuff tear, prediabetes, iron deficiency anemia who presents for evaluation of headaches and vision chan

## 2021-11-19 NOTE — Telephone Encounter (Signed)
Community message sent to [employer] for DME order. Mysha Peeler,CMA ? ?

## 2021-11-20 ENCOUNTER — Ambulatory Visit: Payer: Managed Care, Other (non HMO) | Admitting: Psychiatry

## 2021-11-20 ENCOUNTER — Encounter: Payer: Self-pay | Admitting: Psychiatry

## 2021-11-20 VITALS — BP 122/84 | HR 72 | Ht 61.0 in | Wt 206.5 lb

## 2021-11-20 DIAGNOSIS — R519 Headache, unspecified: Secondary | ICD-10-CM

## 2021-11-20 DIAGNOSIS — M5412 Radiculopathy, cervical region: Secondary | ICD-10-CM

## 2021-11-20 DIAGNOSIS — M542 Cervicalgia: Secondary | ICD-10-CM | POA: Diagnosis not present

## 2021-11-20 DIAGNOSIS — R9089 Other abnormal findings on diagnostic imaging of central nervous system: Secondary | ICD-10-CM | POA: Diagnosis not present

## 2021-11-20 NOTE — Patient Instructions (Signed)
Mri of the brain and the neck ?Physical therapy ?

## 2021-11-21 ENCOUNTER — Other Ambulatory Visit: Payer: Self-pay | Admitting: Radiology

## 2021-11-22 ENCOUNTER — Other Ambulatory Visit: Payer: Self-pay

## 2021-11-22 ENCOUNTER — Observation Stay (HOSPITAL_COMMUNITY)
Admission: RE | Admit: 2021-11-22 | Discharge: 2021-11-23 | Disposition: A | Discharge status: Home / Self Care | Payer: Managed Care, Other (non HMO) | Source: Ambulatory Visit | Attending: Interventional Radiology | Admitting: Interventional Radiology

## 2021-11-22 ENCOUNTER — Other Ambulatory Visit (HOSPITAL_COMMUNITY): Payer: Self-pay | Admitting: Interventional Radiology

## 2021-11-22 ENCOUNTER — Ambulatory Visit (HOSPITAL_COMMUNITY)
Admission: RE | Admit: 2021-11-22 | Discharge: 2021-11-22 | Disposition: A | Discharge status: Home / Self Care | Payer: Managed Care, Other (non HMO) | Source: Ambulatory Visit | Attending: Interventional Radiology | Admitting: Interventional Radiology

## 2021-11-22 ENCOUNTER — Encounter (HOSPITAL_COMMUNITY): Payer: Self-pay

## 2021-11-22 DIAGNOSIS — D259 Leiomyoma of uterus, unspecified: Principal | ICD-10-CM

## 2021-11-22 DIAGNOSIS — Z9889 Other specified postprocedural states: Secondary | ICD-10-CM

## 2021-11-22 HISTORY — PX: IR US GUIDE VASC ACCESS LEFT: IMG2389

## 2021-11-22 HISTORY — PX: IR EMBO TUMOR ORGAN ISCHEMIA INFARCT INC GUIDE ROADMAPPING: IMG5449

## 2021-11-22 HISTORY — PX: IR ANGIOGRAM SELECTIVE EACH ADDITIONAL VESSEL: IMG667

## 2021-11-22 HISTORY — PX: IR ANGIOGRAM PELVIS SELECTIVE OR SUPRASELECTIVE: IMG661

## 2021-11-22 HISTORY — DX: Other specified postprocedural states: Z98.890

## 2021-11-22 HISTORY — DX: Leiomyoma of uterus, unspecified: D25.9

## 2021-11-22 LAB — CBC WITH DIFFERENTIAL/PLATELET
Abs Immature Granulocytes: 0.07 10*3/uL (ref 0.00–0.07)
Basophils Absolute: 0.1 10*3/uL (ref 0.0–0.1)
Basophils Relative: 1 %
Eosinophils Absolute: 0.4 10*3/uL (ref 0.0–0.5)
Eosinophils Relative: 6 %
HCT: 29.6 % — ABNORMAL LOW (ref 36.0–46.0)
Hemoglobin: 8 g/dL — ABNORMAL LOW (ref 12.0–15.0)
Immature Granulocytes: 1 %
Lymphocytes Relative: 23 %
Lymphs Abs: 1.8 10*3/uL (ref 0.7–4.0)
MCH: 19.9 pg — ABNORMAL LOW (ref 26.0–34.0)
MCHC: 27 g/dL — ABNORMAL LOW (ref 30.0–36.0)
MCV: 73.4 fL — ABNORMAL LOW (ref 80.0–100.0)
Monocytes Absolute: 0.8 10*3/uL (ref 0.1–1.0)
Monocytes Relative: 10 %
Neutro Abs: 4.5 10*3/uL (ref 1.7–7.7)
Neutrophils Relative %: 59 %
Platelets: 383 10*3/uL (ref 150–400)
RBC: 4.03 MIL/uL (ref 3.87–5.11)
RDW: 23.9 % — ABNORMAL HIGH (ref 11.5–15.5)
WBC: 7.6 10*3/uL (ref 4.0–10.5)
nRBC: 0.7 % — ABNORMAL HIGH (ref 0.0–0.2)

## 2021-11-22 LAB — BASIC METABOLIC PANEL
Anion gap: 7 (ref 5–15)
BUN: 14 mg/dL (ref 6–20)
CO2: 24 mmol/L (ref 22–32)
Calcium: 8.9 mg/dL (ref 8.9–10.3)
Chloride: 106 mmol/L (ref 98–111)
Creatinine, Ser: 0.71 mg/dL (ref 0.44–1.00)
GFR, Estimated: >60 mL/min (ref >60)
Glucose, Bld: 142 mg/dL — ABNORMAL HIGH (ref 70–99)
Potassium: 3.8 mmol/L (ref 3.5–5.1)
Sodium: 137 mmol/L (ref 135–145)

## 2021-11-22 LAB — PROTIME-INR
INR: 1 (ref 0.8–1.2)
Prothrombin Time: 12.9 seconds (ref 11.4–15.2)

## 2021-11-22 LAB — HCG, SERUM, QUALITATIVE: Preg, Serum: NEGATIVE

## 2021-11-22 IMAGING — US IR EMBO TUMOR ORGAN ISCHEMIA INFARCT INC GUIDE ROADMAPPING
2 series · 13 of 24 positions shown · non-contrast
Comparison: none

INDICATION: Symptomatic uterine fibroids

[Series 1: ir (id) (id)/(id)/(id) · 1 of 1 slices shown]
[im 1/1]
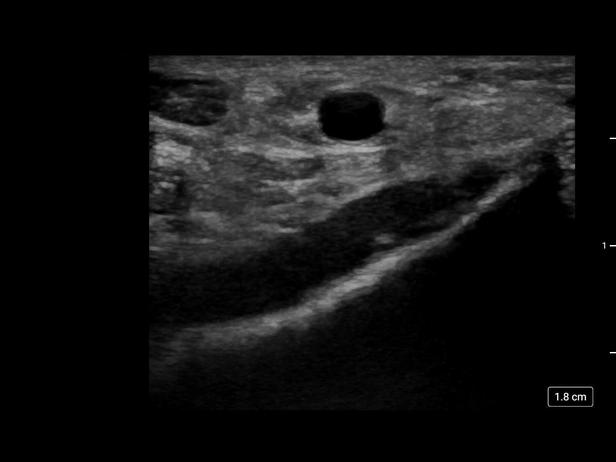

[Series 1: processed: ir embo tumor organ ischemia  · 13 acquisitions, 12 frames shown]
[im 1/13]
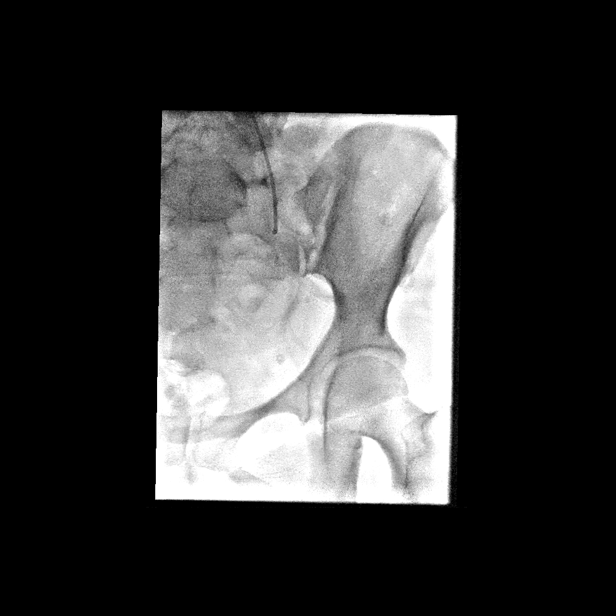
[im 3/13]
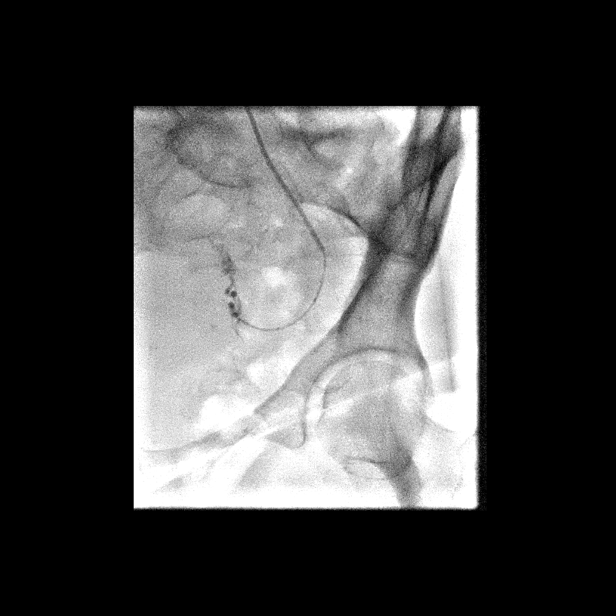
[im 4/13]
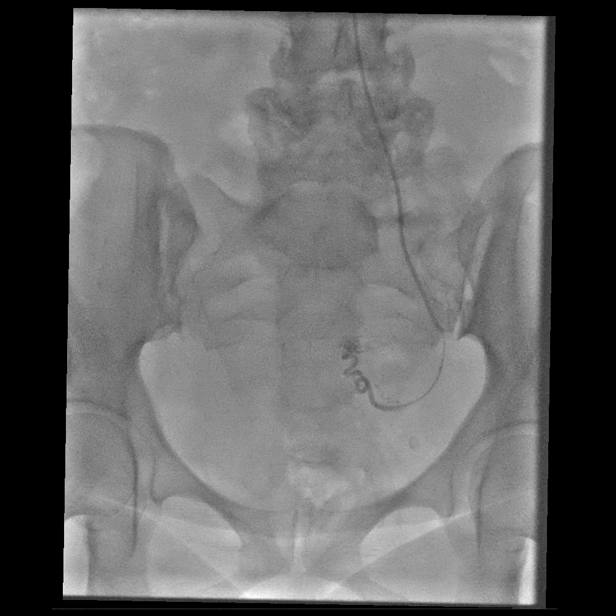
[im 5/13]
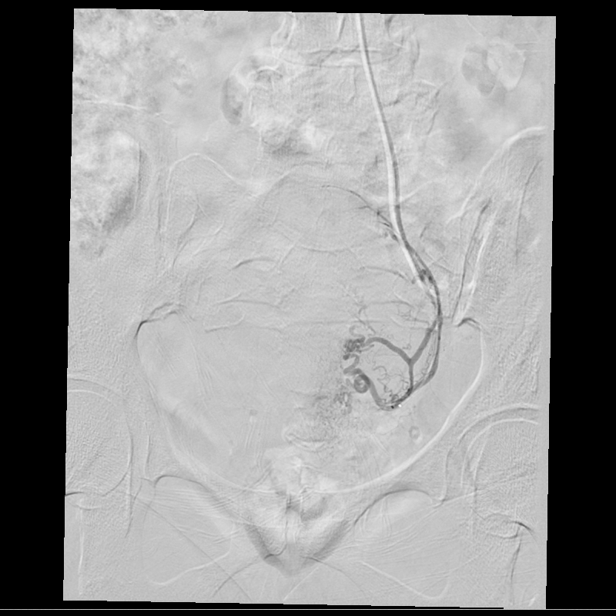
[im 6/13]
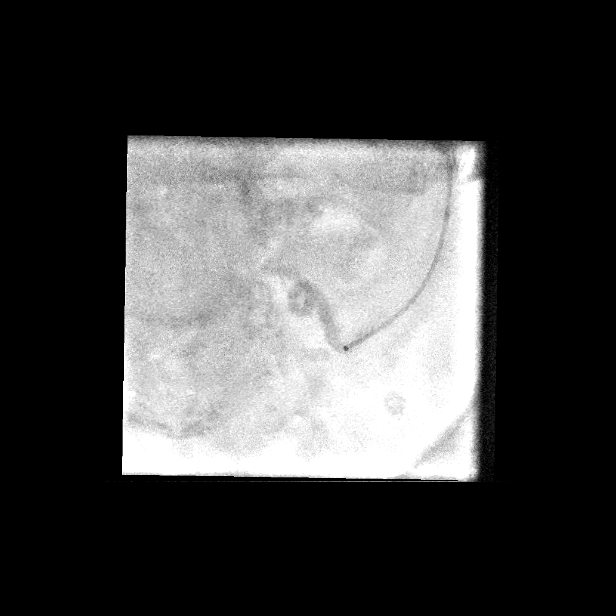
[im 7/13]
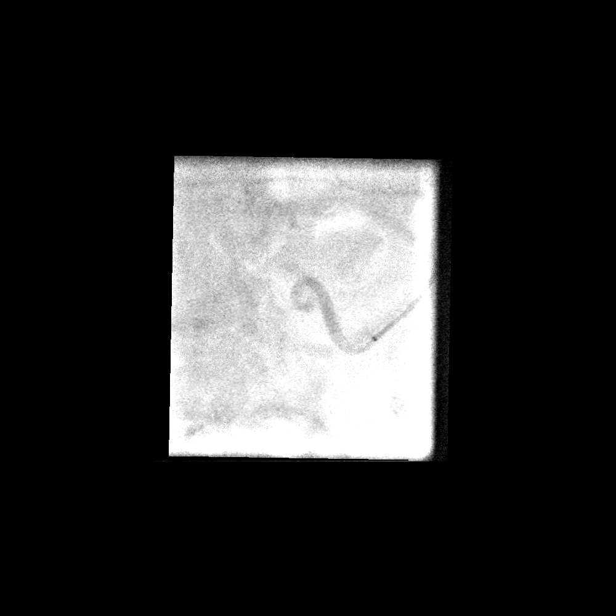
[im 8/13]
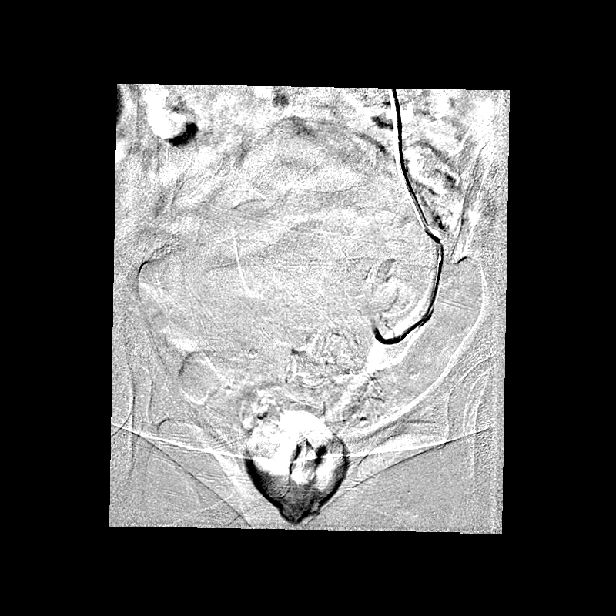
[im 9/13]
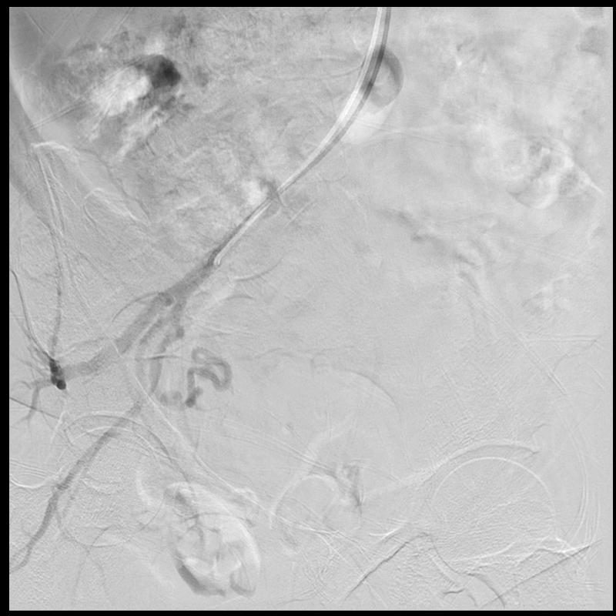
[im 10/13]
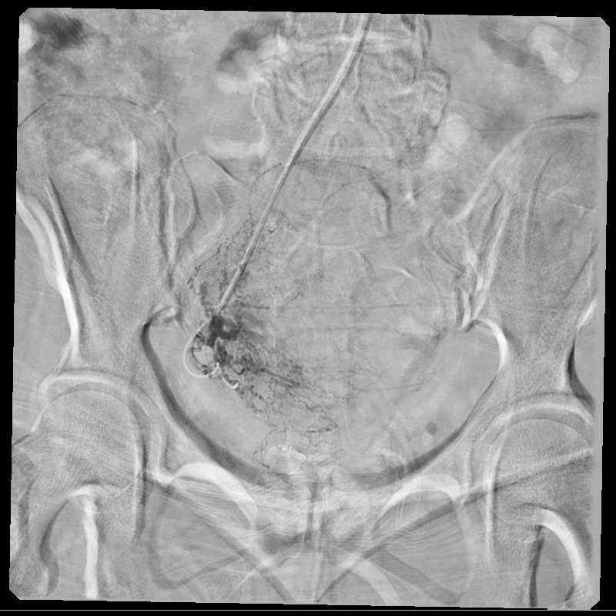
[im 11/13]
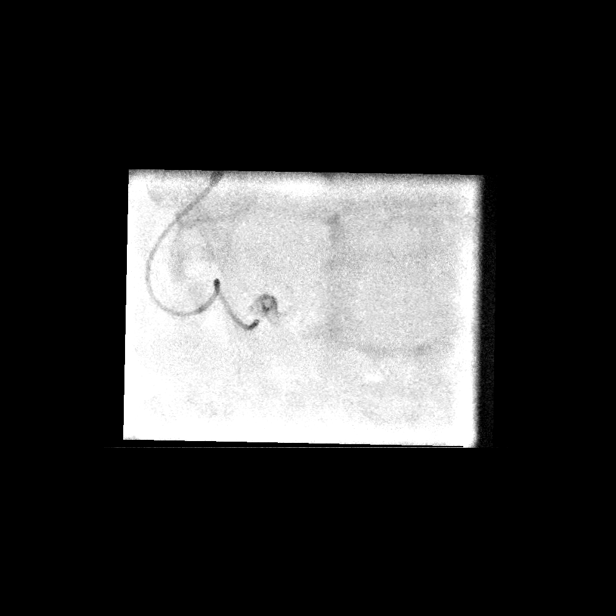
[im 12/13]
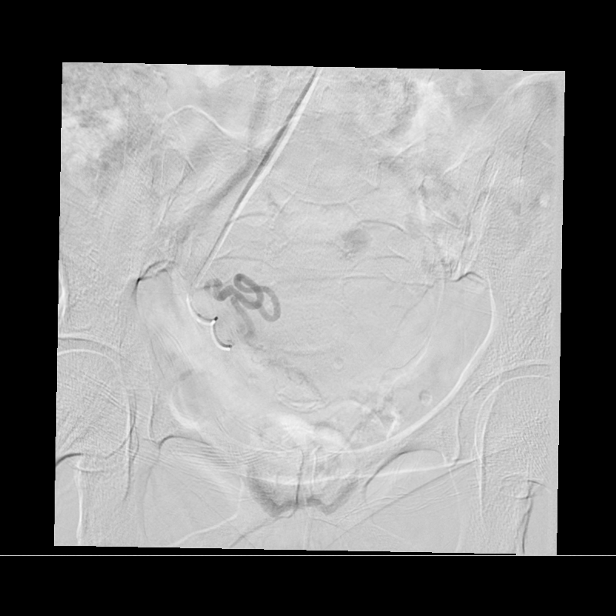
[im 13/13]
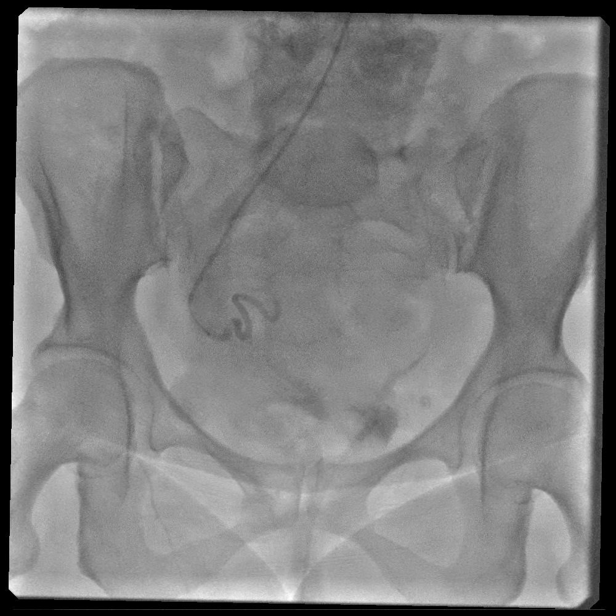

[13 of 24 positions shown; findings below may reference images not displayed]

EXAM:
1. Ultrasound-guided access of the left radial artery

2. Catheterization and angiography of the left internal iliac artery

3. Selective catheterization and angiography of the left uterine
artery

4. Particle embolization of the left uterine artery

5. Catheterization and angiography of the right internal iliac
artery

6. Selective catheterization and angiography of the right uterine
artery

7. Particle embolization of the right uterine artery

MEDICATIONS:
Ancef 2 gm IV. The antibiotic was administered within 1 hour of the
procedure; refer to EMR

ANESTHESIA/SEDATION:
Moderate (conscious) sedation was employed during this procedure. A
total of Versed 6 mg and Fentanyl 150 mcg was administered
intravenously by the radiology nurse.

Total intra-service moderate Sedation Time: 89 minutes. The
patient's level of consciousness and vital signs were monitored
continuously by radiology nursing throughout the procedure under my
direct supervision.

CONTRAST:  150 mL Omnipaque 300

FLUOROSCOPY:
Radiation Exposure Index (as provided by the fluoroscopic device):
26.6 minutes (1,429 mGy)

COMPLICATIONS:
None immediate.



Prior to the procedure, the patient's left radial artery was
assessed for suitable arterial access. The [REDACTED] test demonstrated
a type a waveform of the left ulnar artery following radial artery
occlusion. The radial artery was found to measure 2.7 mm in size.

The patient was placed on the angiography table in the supine
position and the left forearm was prepped and draped in the standard
sterile fashion. An ultrasound of the left wrist was performed and
demonstrates a widely patent left radial artery. A permanent
ultrasound image was stored in the electronic medical record. The
overlying skin was anesthetized with 1% Lidocaine. Using ultrasound
guidance, access into the left renal artery was obtained with
visualization of needle entry into the vessel using a 21 gauge
micropuncture [DATE] wire was easily advanced, and a 5 French
slender sheath was placed. An MG2 tube catheter was used to select
the left common iliac artery. The catheter was used to select the
left internal iliac artery. Angiography was performed, which
demonstrates an enlarged tortuous uterine artery. A 2.8 French
Progreat Omega microcatheter and fathom 16 microwire were then used
to select the left uterine artery. Selective angiography
demonstrates filling of the uterine artery and hypervascular masses
consistent with the patient's known fibroids. No extra-uterine
branches are seen. Embolization of the uterine artery was performed
using 500-700 micron particles. Post embolization angiography
demonstrates sluggish flow with no evidence of filling of the
uterine fibroids.

The 5 French catheter was withdrawn into the abdominal aorta and
used to select the contralateral right internal iliac artery.
Angiography was performed, which demonstrates an enlarged tortuous
uterine artery. The previously described microcatheter and microwire
were then used to select the right uterine artery. Selective
angiography demonstrates filling of the uterine artery and
hypervascular masses consistent with the patient's known fibroids.
No extra uterine branches are seen. Embolization of the uterine
artery was performed using 500-700 micron particles. Post
embolization angiography demonstrates sluggish flow with no evidence
of filling of the uterine fibroids.

All wires and catheters were removed. Hemostasis was achieved using
TR band device. There were no immediate complications. Peripheral
pulses were maintained. The patient was transferred to the recovery
area in stable condition.
IMPRESSION: 1. Successful bilateral uterine artery embolization for treatment of
symptomatic uterine fibroids.

## 2021-11-22 MED ORDER — MIDAZOLAM HCL 2 MG/2ML IJ SOLN
INTRAMUSCULAR | Status: AC | PRN
  Administered 2021-11-22: 1 mg via INTRAVENOUS

## 2021-11-22 MED ORDER — ENALAPRIL MALEATE 5 MG PO TABS
5.0000 mg | ORAL_TABLET | Freq: Every day | ORAL | Status: DC
  Administered 2021-11-23: 5 mg via ORAL
  Filled 2021-11-22: qty 1

## 2021-11-22 MED ORDER — IOHEXOL 300 MG/ML  SOLN
100.0000 mL | Freq: Once | INTRAMUSCULAR | Status: AC | PRN
  Administered 2021-11-22: 34 mL via INTRA_ARTERIAL

## 2021-11-22 MED ORDER — LIDOCAINE HCL (PF) 1 % IJ SOLN
INTRAMUSCULAR | Status: AC | PRN
  Administered 2021-11-22: 2 mL via INTRADERMAL

## 2021-11-22 MED ORDER — METOPROLOL SUCCINATE ER 50 MG PO TB24
50.0000 mg | ORAL_TABLET | Freq: Every day | ORAL | Status: DC
  Administered 2021-11-23: 50 mg via ORAL
  Filled 2021-11-22: qty 1

## 2021-11-22 MED ORDER — SODIUM CHLORIDE 0.9 % IV SOLN: INTRAVENOUS | Status: DC

## 2021-11-22 MED ORDER — ENALAPRIL-HYDROCHLOROTHIAZIDE 5-12.5 MG PO TABS: 1.0000 | ORAL_TABLET | Freq: Every day | ORAL | Status: DC

## 2021-11-22 MED ORDER — VERAPAMIL HCL 2.5 MG/ML IV SOLN
INTRAVENOUS | Status: AC
Start: 2021-11-22 — End: 2021-11-22
  Filled 2021-11-22: qty 2

## 2021-11-22 MED ORDER — HEPARIN SODIUM (PORCINE) 1000 UNIT/ML IJ SOLN
INTRAMUSCULAR | Status: AC
  Filled 2021-11-22: qty 10

## 2021-11-22 MED ORDER — HYDROMORPHONE 1 MG/ML IV SOLN
INTRAVENOUS | Status: DC
  Administered 2021-11-22: 3 mg via INTRAVENOUS
  Administered 2021-11-22 (×2): 0.9 mg via INTRAVENOUS
  Administered 2021-11-23: 0.6 mg via INTRAVENOUS
  Filled 2021-11-22 (×11): qty 30

## 2021-11-22 MED ORDER — DIPHENHYDRAMINE HCL 12.5 MG/5ML PO ELIX
12.5000 mg | ORAL_SOLUTION | Freq: Four times a day (QID) | ORAL | Status: DC | PRN
  Filled 2021-11-22: qty 5

## 2021-11-22 MED ORDER — MIDAZOLAM HCL 2 MG/2ML IJ SOLN
INTRAMUSCULAR | Status: AC | PRN
Start: 2021-11-22 — End: 2021-11-22

## 2021-11-22 MED ORDER — KETOROLAC TROMETHAMINE 30 MG/ML IJ SOLN
INTRAMUSCULAR | Status: AC
Start: 2021-11-22 — End: 2021-11-22
  Filled 2021-11-22: qty 2

## 2021-11-22 MED ORDER — KETOROLAC TROMETHAMINE 30 MG/ML IJ SOLN
INTRAMUSCULAR | Status: AC | PRN
  Administered 2021-11-22: 30 mg via INTRAVENOUS

## 2021-11-22 MED ORDER — ONDANSETRON HCL 4 MG/2ML IJ SOLN
4.0000 mg | Freq: Four times a day (QID) | INTRAMUSCULAR | Status: DC | PRN
  Administered 2021-11-22: 4 mg via INTRAVENOUS
  Filled 2021-11-22: qty 2

## 2021-11-22 MED ORDER — LIDOCAINE HCL (PF) 1 % IJ SOLN
INTRAMUSCULAR | Status: AC
  Filled 2021-11-22: qty 30

## 2021-11-22 MED ORDER — SODIUM CHLORIDE 0.9% FLUSH
3.0000 mL | Freq: Two times a day (BID) | INTRAVENOUS | Status: DC
  Administered 2021-11-22: 3 mL via INTRAVENOUS

## 2021-11-22 MED ORDER — FENTANYL CITRATE (PF) 100 MCG/2ML IJ SOLN
INTRAMUSCULAR | Status: AC
  Filled 2021-11-22: qty 4

## 2021-11-22 MED ORDER — OXYCODONE HCL 5 MG PO TABS: 10.0000 mg | ORAL_TABLET | ORAL | Status: DC | PRN

## 2021-11-22 MED ORDER — IOHEXOL 300 MG/ML  SOLN
100.0000 mL | Freq: Once | INTRAMUSCULAR | Status: AC | PRN
  Administered 2021-11-22: 16 mL via INTRA_ARTERIAL

## 2021-11-22 MED ORDER — KETOROLAC TROMETHAMINE 30 MG/ML IJ SOLN
INTRAMUSCULAR | Status: AC | PRN
  Administered 2021-11-22: 30 mg via INTRAMUSCULAR

## 2021-11-22 MED ORDER — SODIUM CHLORIDE 0.9 % IV SOLN
250.0000 mL | INTRAVENOUS | Status: DC | PRN
  Administered 2021-11-22: 250 mL via INTRAVENOUS

## 2021-11-22 MED ORDER — SODIUM CHLORIDE 0.9% FLUSH: 9.0000 mL | INTRAVENOUS | Status: DC | PRN

## 2021-11-22 MED ORDER — DIPHENHYDRAMINE HCL 50 MG/ML IJ SOLN: 12.5000 mg | Freq: Four times a day (QID) | INTRAMUSCULAR | Status: DC | PRN

## 2021-11-22 MED ORDER — DOCUSATE SODIUM 100 MG PO CAPS
100.0000 mg | ORAL_CAPSULE | Freq: Two times a day (BID) | ORAL | Status: DC
  Administered 2021-11-22 – 2021-11-23 (×2): 100 mg via ORAL
  Filled 2021-11-22 (×2): qty 1

## 2021-11-22 MED ORDER — VERAPAMIL HCL 2.5 MG/ML IV SOLN
INTRA_ARTERIAL | Status: AC | PRN
  Administered 2021-11-22: 6 mL via INTRA_ARTERIAL

## 2021-11-22 MED ORDER — KETOROLAC TROMETHAMINE 15 MG/ML IJ SOLN
15.0000 mg | Freq: Four times a day (QID) | INTRAMUSCULAR | Status: DC
  Administered 2021-11-22 – 2021-11-23 (×2): 15 mg via INTRAVENOUS
  Filled 2021-11-22 (×3): qty 1

## 2021-11-22 MED ORDER — HYDROMORPHONE HCL 1 MG/ML IJ SOLN
1.0000 mg | Freq: Once | INTRAMUSCULAR | Status: AC
  Administered 2021-11-22: 1 mg via INTRAVENOUS
  Filled 2021-11-22: qty 1

## 2021-11-22 MED ORDER — ACETAMINOPHEN 80 MG PO CHEW
1000.0000 mg | CHEWABLE_TABLET | Freq: Once | ORAL | Status: AC
  Filled 2021-11-22: qty 13

## 2021-11-22 MED ORDER — NITROGLYCERIN 2 % TD OINT
TOPICAL_OINTMENT | TRANSDERMAL | Status: AC
  Filled 2021-11-22: qty 1

## 2021-11-22 MED ORDER — CEFAZOLIN SODIUM-DEXTROSE 2-4 GM/100ML-% IV SOLN: 2.0000 g | INTRAVENOUS | Status: AC

## 2021-11-22 MED ORDER — ACETAMINOPHEN 500 MG PO TABS
1000.0000 mg | ORAL_TABLET | Freq: Once | ORAL | Status: AC
  Administered 2021-11-22: 1000 mg via ORAL
  Filled 2021-11-22: qty 2

## 2021-11-22 MED ORDER — MIDAZOLAM HCL 2 MG/2ML IJ SOLN
INTRAMUSCULAR | Status: AC
  Filled 2021-11-22: qty 6

## 2021-11-22 MED ORDER — SODIUM CHLORIDE 0.9% FLUSH: 3.0000 mL | INTRAVENOUS | Status: DC | PRN

## 2021-11-22 MED ORDER — FENTANYL CITRATE (PF) 100 MCG/2ML IJ SOLN
INTRAMUSCULAR | Status: AC | PRN
  Administered 2021-11-22: 50 ug via INTRAVENOUS

## 2021-11-22 MED ORDER — ACETAMINOPHEN 500 MG PO TABS
1000.0000 mg | ORAL_TABLET | Freq: Three times a day (TID) | ORAL | Status: DC
  Administered 2021-11-22 – 2021-11-23 (×3): 1000 mg via ORAL
  Filled 2021-11-22 (×3): qty 2

## 2021-11-22 MED ORDER — NITROGLYCERIN IN D5W 100-5 MCG/ML-% IV SOLN
INTRAVENOUS | Status: AC
  Filled 2021-11-22: qty 250

## 2021-11-22 MED ORDER — OXYCODONE HCL 5 MG PO TABS: 5.0000 mg | ORAL_TABLET | ORAL | Status: DC | PRN

## 2021-11-22 MED ORDER — NALOXONE HCL 0.4 MG/ML IJ SOLN: 0.4000 mg | INTRAMUSCULAR | Status: DC | PRN

## 2021-11-22 MED ORDER — ONDANSETRON HCL 4 MG/2ML IJ SOLN
8.0000 mg | Freq: Once | INTRAMUSCULAR | Status: DC
  Filled 2021-11-22: qty 4

## 2021-11-22 MED ORDER — SODIUM CHLORIDE 0.9 % IV SOLN
8.0000 mg | Freq: Once | INTRAVENOUS | Status: AC
  Administered 2021-11-22: 8 mg via INTRAVENOUS
  Filled 2021-11-22: qty 4

## 2021-11-22 MED ORDER — IOHEXOL 300 MG/ML  SOLN
100.0000 mL | Freq: Once | INTRAMUSCULAR | Status: AC | PRN
  Administered 2021-11-22: 100 mL via INTRA_ARTERIAL

## 2021-11-22 MED ORDER — LIDOCAINE HCL 1 % IJ SOLN
INTRAMUSCULAR | Status: AC
  Filled 2021-11-22: qty 20

## 2021-11-22 MED ORDER — ONDANSETRON HCL 4 MG/2ML IJ SOLN: 4.0000 mg | Freq: Four times a day (QID) | INTRAMUSCULAR | Status: DC | PRN

## 2021-11-22 MED ORDER — CEFAZOLIN SODIUM-DEXTROSE 2-4 GM/100ML-% IV SOLN
INTRAVENOUS | Status: AC
  Administered 2021-11-22: 2 g via INTRAVENOUS
  Filled 2021-11-22: qty 100

## 2021-11-22 MED ORDER — HYDROCHLOROTHIAZIDE 12.5 MG PO TABS
12.5000 mg | ORAL_TABLET | Freq: Every day | ORAL | Status: DC
  Administered 2021-11-23: 12.5 mg via ORAL
  Filled 2021-11-22: qty 1

## 2021-11-22 NOTE — Procedures (Signed)
Interventional Radiology Procedure Note ? ?Date of Procedure: 11/22/2021  ?Procedure: [place] ? ?Findings:  ?1. Successful bilateral uterine artery embolization using beads.  ?2. Left radial access with TR band closure   ? ?Complications: No immediate complications noted.  ? ?Estimated Blood Loss: minimal ? ?Follow-up and Recommendations: ?1. Admit overnight to IR for post op observation and pain control  ?2. TR band protocol  ?3. Dilaudid PCA, Toradol, Tylenol overnight; transition to PO meds  ?4. Remove Foley once able to ambulate, OOBAT  ? ? ?Albin Felling, MD  ?Vascular & Interventional Radiology  ?11/22/2021 12:19 PM ? ? ? ?

## 2021-11-22 NOTE — H&P (Addendum)
? ? ?Referring Physician(s): ?Bass,Lawrence ? ?Supervising Physician: Juliet Rude ? ?Patient Status:  [facility] ? ?Chief Complaint: ? ?Symptomatic uterine fibroids ? ?Subjective: ?Patient familiar to IR service from consultation with Dr. Denna Haggard on 08/14/21 to discuss treatment options for symptomatic uterine fibroids.  She was deemed an appropriate candidate for bilateral uterine artery embolization and presents today for the procedure.  She currently denies fever, chest pain, dyspnea, abdominal/back pain, nausea, vomiting.  She does have occasional headaches, occasional cough, menorrhagia.  Additional history as below. ? ?Past Medical History:  ?Diagnosis Date  ? Abnormal screening mammogram 07/01/2013  ? 01/2013  ? Anemia   ? Anemia, iron deficiency 11/18/2006  ? Qualifier: History of  By: Walker Kehr MD, Patrick Jupiter    ? Arthritis   ? Breast pain, left 11/28/2014  ? Carpal tunnel syndrome 07/20/2014  ? Cervical cancer screening 06/14/2020  ? CERVICAL RADICULOPATHY, RIGHT 04/26/2008  ? Qualifier: Diagnosis of  By: Walker Kehr MD, Patrick Jupiter    ? Chest tightness 02/11/2012  ? At rest  ? Chronic right hip pain 01/11/2016  ? Colon polyps   ? Constipation 11/02/2014  ? Fatigue 06/02/2015  ? Fibroid uterus 04/26/2021  ? Generalized joint pain 01/27/2007  ? Acetaminophen for daily pain, Short course of Ibuprofen for more severe flare.    ? History of colonic polyps 06/07/2016  ? Colonoscopy 2010 Hepatic flexure sessile polyp measuring 1.0 cm Transverse colon sessile polyp measuring 4 mm  ? History of ETT 08/2006  ? no sustained tachycardia  ? Hypertension   ? Left arm pain 11/28/2014  ? Left knee pain 01/10/2017  ? Left shoulder pain 01/11/2016  ? LVH (left ventricular hypertrophy) 06/07/2016  ? EKG 06/07/16  ? Menorrhagia 04/21/2012  ? Morbid obesity ([place]) 10/03/2014  ? 2015  BMI 36 2017  BMI 37  05/2016 ASCVD 2.4%  ? Neuropathy 11/25/2017  ? OSA (obstructive sleep apnea) 03/20/2016  ? Sleep study 07/2015 showed mild OSA. No CPAP recommended.   ? Palpitations  12/03/2017  ? Prediabetes   ? Preventative health care 10/03/2014  ? Right shoulder pain 08/23/2020  ? Rotator cuff impingement syndrome 08/17/2018  ? Shortness of breath 11/25/2017  ? Unspecified vitamin D deficiency 12/09/2012  ? Level 23 on 12/08/12 and Vitamin D3 1000-2000 IU daily was recommended  ? ?Past Surgical History:  ?Procedure Laterality Date  ? [place]  ? [place]  ? IR RADIOLOGIST EVAL & MGMT  08/14/2021  ? SALPINGECTOMY Right   ? ? ? ? ?Allergies: ?Patient has no known allergies. ? ?Medications: ?Prior to Admission medications   ?Medication Sig Start Date End Date Taking? Authorizing Provider  ?acetaminophen (TYLENOL) 650 MG CR tablet Take 2 tablets (1,300 mg total) by mouth every 8 (eight) hours as needed for pain. take 2 tabs every 6 hours as needed for pain 10/24/10   Candelaria Celeste, MD  ?cetirizine (ZYRTEC ALLERGY) 10 MG tablet Take 1 tablet (10 mg total) by mouth daily. 10/30/21   Autry-Lott, Naaman Plummer, DO  ?Enalapril-hydroCHLOROthiazide 5-12.5 MG tablet Take 1 tablet by mouth daily. 10/10/20   Leeanne Rio, MD  ?ferrous sulfate 325 (65 FE) MG EC tablet Take 325 mg by mouth 3 (three) times daily with meals.    [provider]  ?fluticasone (FLONASE) 50 MCG/ACT nasal spray Place 2 sprays into both nostrils daily. 10/30/21   Autry-Lott, Naaman Plummer, DO  ?metoprolol succinate (TOPROL-XL) 50 MG 24 hr tablet Take 50 mg by mouth daily.  [provider]  ?polyethylene glycol (MIRALAX / GLYCOLAX) 17 g packet Take 17 g by mouth 2 (two) times daily as needed for constipation.    [provider]  ? ? ? ?Vital Signs:PENDING ?LMP 11/04/2021 (Exact Date)  ? ?Physical Exam awake, alert.  Chest clear to auscultation bilaterally.  Heart with regular rate and rhythm.  Abdomen soft, obese, positive bowel sounds, nontender.  No lower extremity edema.  Intact distal pulses. ? ?Imaging: ?No results found. ? ?Labs: ? ?CBC: ?Recent Labs  ?  04/30/21 ?1141 05/10/21 ?1009  09/05/21 ?1708 11/08/21 ?1950  ?WBC 6.8 6.8 6.3 8.7  ?HGB 11.0* 9.9* 9.1* 7.5*  ?HCT 36.8 33.5* 30.1* 27.0*  ?PLT 362 328 405 402*  ? ? ?COAGS: ?No results for input(s): INR, APTT in the last 8760 hours. ? ?BMP: ?Recent Labs  ?  04/30/21 ?1141 05/10/21 ?1009 11/08/21 ?1950  ?NA CANCELED 137 134*  ?K CANCELED 4.5 3.5  ?CL CANCELED 100 99  ?CO2 CANCELED 21 25  ?GLUCOSE CANCELED 130* 97  ?BUN CANCELED 9 11  ?CALCIUM CANCELED 9.2 8.9  ?CREATININE CANCELED 0.61 0.64  ?GFRNONAA  --   --  >60  ? ? ?LIVER FUNCTION TESTS: ?No results for input(s): BILITOT, AST, ALT, ALKPHOS, PROT, ALBUMIN in the last 8760 hours. ? ?Assessment and Plan: ?Patient familiar to IR service from consultation with Dr. Denna Haggard on 08/14/21 to discuss treatment options for symptomatic uterine fibroids.  She was deemed an appropriate candidate for bilateral uterine artery embolization and presents today for the procedure. Risks and benefits of procedure were discussed with the patient including, but not limited to bleeding, infection, vascular injury or contrast induced renal failure. ? ?This interventional procedure involves the use of X-rays and because of the nature of the planned procedure, it is possible that we will have prolonged use of X-ray fluoroscopy. ? ?Potential radiation risks to you include (but are not limited to) the following: ?- A slightly elevated risk for cancer  several years later in life. This risk is typically less ?than 0.5% percent. This risk is low in comparison to the normal incidence of human ?cancer, which is 33% for women and 50% for men according to the American Cancer ?Society. ?- Radiation induced injury can include skin redness, resembling a rash, tissue breakdown / ulcers and hair loss (which can be temporary or permanent).  ? ?The likelihood of either of these occurring depends on the difficulty of the procedure and whether you are sensitive to radiation due to previous procedures, disease, or genetic conditions.   ? ?IF your procedure requires a prolonged use of radiation, you will be notified and given written instructions for further action.  It is your responsibility to monitor the irradiated area for the 2 weeks following the procedure and to notify your physician if you are concerned that you have suffered a radiation induced injury.   ? ?All of the patient's questions were answered, patient is agreeable to proceed. ? ?Consent signed and in chart. ? ? ?LABS PENDING ? ? ?Electronically Signed: ?Autumn Messing, PA-C ?11/22/2021, 7:53 AM ? ? ?I spent a total of 30 minutes at the the patient's bedside AND on the patient's hospital floor or unit, greater than 50% of which was counseling/coordinating care for bilateral uterine artery embolization ? ? ? ? ? ?

## 2021-11-23 ENCOUNTER — Telehealth: Payer: Self-pay | Admitting: *Deleted

## 2021-11-23 ENCOUNTER — Ambulatory Visit (HOSPITAL_COMMUNITY): Payer: Managed Care, Other (non HMO)

## 2021-11-23 DIAGNOSIS — D259 Leiomyoma of uterus, unspecified: Secondary | ICD-10-CM | POA: Diagnosis not present

## 2021-11-23 MED ORDER — HYDROCODONE-ACETAMINOPHEN 5-325 MG PO TABS
1.0000 | ORAL_TABLET | Freq: Four times a day (QID) | ORAL | 0 refills | Status: DC | PRN
Start: 2021-11-23 — End: 2021-12-26

## 2021-11-23 MED ORDER — ONDANSETRON HCL 4 MG PO TABS: 4.0000 mg | ORAL_TABLET | Freq: Three times a day (TID) | ORAL | 0 refills | Status: DC | PRN

## 2021-11-23 MED ORDER — HYDROCODONE-ACETAMINOPHEN 5-325 MG PO TABS: 1.0000 | ORAL_TABLET | Freq: Four times a day (QID) | ORAL | Status: DC | PRN

## 2021-11-23 MED ORDER — IBUPROFEN 200 MG PO TABS: 600.0000 mg | ORAL_TABLET | Freq: Four times a day (QID) | ORAL | 0 refills | Status: AC | PRN

## 2021-11-23 MED ORDER — HYDROCODONE-ACETAMINOPHEN 5-325 MG PO TABS: 1.0000 | ORAL_TABLET | Freq: Four times a day (QID) | ORAL | 0 refills | Status: DC | PRN

## 2021-11-23 NOTE — Progress Notes (Signed)
Transition of Care (TOC) Screening Note ? ?Patient Details  ?Name: Adrienne Collins ?Date of Birth: 1971-07-09 ? ?Transition of Care (TOC) CM/SW Contact:    ?Sherie Don, LCSW ?Phone Number: ?11/23/2021, 10:50 AM ? ?Transition of Care Department [facility]) has reviewed patient and no TOC needs have been identified at this time. We will continue to monitor patient advancement through interdisciplinary progression rounds. If new patient transition needs arise, please place a TOC consult. ?

## 2021-11-23 NOTE — Discharge Instructions (Signed)
Stay well-hydrated, avoid strenuous activity for the next week, use Advil as primary source for pain control and Vicodin only for pain not relieved by Advil.  Please take Advil with food.  Do not take Tylenol and Vicodin together. ?

## 2021-11-23 NOTE — Telephone Encounter (Signed)
Received a faxed copy of the denial from patient's insurance for her sleep study.  Please review and appeal if you feel these are necessary next steps.  Information placed in your box.  Michaelpaul Apo,CMA ? ?

## 2021-11-23 NOTE — Discharge Summary (Addendum)
? ?Patient ID: ?Adrienne Collins ?MRN: [id number] ?DOB/AGE: 1970/11/02 51 y.o. ? ?Admit date: 11/22/2021 ?Discharge date: 11/23/2021 ? ?Supervising Physician: Juliet Rude ? ?Patient Status: [facility] - [facility]-pt ? ?Admission Diagnoses: Symptomatic uterine fibroids ? ?Discharge Diagnoses: Symptomatic uterine fibroids, status post bilateral uterine artery embolization via left radial artery access on 11/22/2021 ?Principal Problem: ?  Status post embolization of uterine artery ? ?Past Medical History:  ?Diagnosis Date  ? Abnormal screening mammogram 07/01/2013  ? 01/2013  ? Anemia   ? Anemia, iron deficiency 11/18/2006  ? Qualifier: History of  By: Walker Kehr MD, Patrick Jupiter    ? Arthritis   ? Breast pain, left 11/28/2014  ? Carpal tunnel syndrome 07/20/2014  ? Cervical cancer screening 06/14/2020  ? CERVICAL RADICULOPATHY, RIGHT 04/26/2008  ? Qualifier: Diagnosis of  By: Walker Kehr MD, Patrick Jupiter    ? Chest tightness 02/11/2012  ? At rest  ? Chronic right hip pain 01/11/2016  ? Colon polyps   ? Constipation 11/02/2014  ? Fatigue 06/02/2015  ? Fibroid uterus 04/26/2021  ? Generalized joint pain 01/27/2007  ? Acetaminophen for daily pain, Short course of Ibuprofen for more severe flare.    ? History of colonic polyps 06/07/2016  ? Colonoscopy 2010 Hepatic flexure sessile polyp measuring 1.0 cm Transverse colon sessile polyp measuring 4 mm  ? History of ETT 08/2006  ? no sustained tachycardia  ? Hypertension   ? Left arm pain 11/28/2014  ? Left knee pain 01/10/2017  ? Left shoulder pain 01/11/2016  ? LVH (left ventricular hypertrophy) 06/07/2016  ? EKG 06/07/16  ? Menorrhagia 04/21/2012  ? Morbid obesity ([place]) 10/03/2014  ? 2015  BMI 36 2017  BMI 37  05/2016 ASCVD 2.4%  ? Neuropathy 11/25/2017  ? OSA (obstructive sleep apnea) 03/20/2016  ? Sleep study 07/2015 showed mild OSA. No CPAP recommended.   ? Palpitations 12/03/2017  ? Prediabetes   ? Preventative health care 10/03/2014  ? Right shoulder pain 08/23/2020  ? Rotator cuff impingement syndrome 08/17/2018  ? Shortness of breath  11/25/2017  ? Unspecified vitamin D deficiency 12/09/2012  ? Level 23 on 12/08/12 and Vitamin D3 1000-2000 IU daily was recommended  ? ?Past Surgical History:  ?Procedure Laterality Date  ? [place]  ? [place]  ? IR ANGIOGRAM PELVIS SELECTIVE OR SUPRASELECTIVE  11/22/2021  ? IR ANGIOGRAM SELECTIVE EACH ADDITIONAL VESSEL  11/22/2021  ? IR ANGIOGRAM SELECTIVE EACH ADDITIONAL VESSEL  11/22/2021  ? IR EMBO TUMOR ORGAN ISCHEMIA INFARCT INC GUIDE ROADMAPPING  11/22/2021  ? IR RADIOLOGIST EVAL & MGMT  08/14/2021  ? IR US GUIDE VASC ACCESS LEFT  11/22/2021  ? SALPINGECTOMY Right   ? ? ? ?Discharged Condition: good ? ?Hospital Course: Adrienne Collins is a 51 year old female with history of symptomatic uterine fibroids who under consultation with Dr. Denna Haggard on 08/14/2021 to discuss treatment options.  She was deemed an appropriate candidate for bilateral uterine artery embolization and underwent the procedure at [facility] on 11/22/2021.  The procedure was performed with IV conscious sedation and via left radial artery approach.  There were no immediate complications and the patient was subsequently admitted to the hospital overnight for pain control.  She was placed on Dilaudid PCA pump.  Overnight the patient did fairly well with expected pelvic cramping and some intermittent nausea and vomiting.  She was given antiemetics as needed. On the  day of discharge she was stable.  She was able to void, tolerate her diet  and ambulate without significant difficulty.  She did have some minimal pelvic cramping.  She has also been spotting some.  She was deemed stable for discharge at this time.  Prescriptions were sent into patient's pharmacy electronically for Vicodin, ibuprofen, and Zofran.  She  will continue her current home medications.  She was encouraged to stay hydrated, avoid strenuous activity for the next week and contact our service with any questions.  We will arrange for follow-up with Dr.El Abd in  3-4 weeks.  She will continue follow-up with her gynecologist Dr. Elgie Congo as scheduled. ? ?Consults: None ? ?Significant Diagnostic Studies:  ?Results for orders placed or performed during the hospital encounter of 11/22/21  ?CBC with Differential/Platelet  ?Result Value Ref Range  ? WBC 7.6 4.0 - 10.5 K/uL  ? RBC 4.03 3.87 - 5.11 MIL/uL  ? Hemoglobin 8.0 (L) 12.0 - 15.0 g/dL  ? HCT 29.6 (L) 36.0 - 46.0 %  ? MCV 73.4 (L) 80.0 - 100.0 fL  ? MCH 19.9 (L) 26.0 - 34.0 pg  ? MCHC 27.0 (L) 30.0 - 36.0 g/dL  ? RDW 23.9 (H) 11.5 - 15.5 %  ? Platelets 383 150 - 400 K/uL  ? nRBC 0.7 (H) 0.0 - 0.2 %  ? Neutrophils Relative % 59 %  ? Neutro Abs 4.5 1.7 - 7.7 K/uL  ? Lymphocytes Relative 23 %  ? Lymphs Abs 1.8 0.7 - 4.0 K/uL  ? Monocytes Relative 10 %  ? Monocytes Absolute 0.8 0.1 - 1.0 K/uL  ? Eosinophils Relative 6 %  ? Eosinophils Absolute 0.4 0.0 - 0.5 K/uL  ? Basophils Relative 1 %  ? Basophils Absolute 0.1 0.0 - 0.1 K/uL  ? Immature Granulocytes 1 %  ? Abs Immature Granulocytes 0.07 0.00 - 0.07 K/uL  ? Polychromasia PRESENT   ?Protime-INR  ?Result Value Ref Range  ? Prothrombin Time 12.9 11.4 - 15.2 seconds  ? INR 1.0 0.8 - 1.2  ?Basic metabolic panel  ?Result Value Ref Range  ? Sodium 137 135 - 145 mmol/L  ? Potassium 3.8 3.5 - 5.1 mmol/L  ? Chloride 106 98 - 111 mmol/L  ? CO2 24 22 - 32 mmol/L  ? Glucose, Bld 142 (H) 70 - 99 mg/dL  ? BUN 14 6 - 20 mg/dL  ? Creatinine, Ser 0.71 0.44 - 1.00 mg/dL  ? Calcium 8.9 8.9 - 10.3 mg/dL  ? GFR, Estimated >60 >60 mL/min  ? Anion gap 7 5 - 15  ?hCG, serum, qualitative  ?Result Value Ref Range  ? Preg, Serum NEGATIVE NEGATIVE  ? ? ? ?Treatments: Bilateral uterine artery embolization via left radial artery access/ IV conscious sedation on 11/22/2021 ? ?Discharge Exam: ?Blood pressure (!) 144/79, pulse 83, temperature 98.6 ?F (37 ?C), temperature source Oral, resp. rate 16, height '5\' 1"'$  (1.549 m), weight 206 lb 8 oz (93.7 kg), last menstrual period 11/04/2021, SpO2 98 %. ?Patient awake,  alert.  Chest clear to auscultation bilaterally.  Heart with regular rate and rhythm.  Abdomen obese, soft, positive bowel sounds, currently nontender.  No lower extremity edema, intact distal pulses, puncture site left radial artery soft, clean, dry, no hematoma ? ?Disposition: Discharge disposition: 01-Home or Self Care ? ? ? ? ? ? ?Discharge Instructions   ? ? Call MD for:  difficulty breathing, headache or visual disturbances   Complete by: As directed ?  ? Call MD for:  extreme fatigue   Complete by: As directed ?  ? Call MD for:  hives   Complete by: As directed ?  ? Call MD for:  persistant dizziness or light-headedness   Complete by: As directed ?  ? Call MD for:  persistant nausea and vomiting   Complete by: As directed ?  ? Call MD for:  redness, tenderness, or signs of infection (pain, swelling, redness, odor or green/yellow discharge around incision site)   Complete by: As directed ?  ? Call MD for:  severe uncontrolled pain   Complete by: As directed ?  ? Call MD for:  temperature >100.4   Complete by: As directed ?  ? Change dressing (specify)   Complete by: As directed ?  ? Change Band-Aid over puncture site left wrist daily for the next 2 to 3 days.  Wash site with soap and water.  ? Diet - low sodium heart healthy   Complete by: As directed ?  ? Discharge instructions   Complete by: As directed ?  ? Stay well-hydrated, resume home medications, with strenuous activity for the next 3 to 4 days.  Utilize Advil as primary pain medicine with Vicodin use only if pain not relieved with Advil  ? Driving Restrictions   Complete by: As directed ?  ? No driving for the next 24 hours or after taking narcotic medication  ? Increase activity slowly   Complete by: As directed ?  ? Lifting restrictions   Complete by: As directed ?  ? Avoid strenuous activity for the next week  ? May shower / Bathe   Complete by: As directed ?  ? May walk up steps   Complete by: As directed ?  ? Sexual Activity Restrictions    Complete by: As directed ?  ? No sexual intercourse for 1 week  ? ?  ? ?Allergies as of 11/23/2021   ?No Known Allergies ?  ? ?  ?Medication List  ?  ? ?TAKE these medications   ? ?acetaminophen 650 MG CR tablet

## 2021-11-26 ENCOUNTER — Telehealth: Payer: Self-pay | Admitting: Psychiatry

## 2021-11-26 NOTE — Telephone Encounter (Signed)
Novella Rob: [id number] (exp. 11/26/21 to 05/25/22) order faxed to [facility], they will reach out to the pt to schedule  ?

## 2021-11-27 NOTE — Telephone Encounter (Signed)
schedule for 4/26 6pm  ?

## 2021-12-04 NOTE — Telephone Encounter (Signed)
Got a message from [place] with [facility]: ? ?"This patient has decided to do the head only for now due to cost. ?Her appt is tomorrow evening, just wanted to update you." ?

## 2021-12-10 ENCOUNTER — Ambulatory Visit: Payer: Managed Care, Other (non HMO) | Admitting: Physical Therapy
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
## 2021-12-18 ENCOUNTER — Ambulatory Visit: Payer: Managed Care, Other (non HMO) | Admitting: Family Medicine

## 2021-12-18 VITALS — BP 128/74 | HR 79 | Wt 202.4 lb

## 2021-12-18 DIAGNOSIS — I10 Essential (primary) hypertension: Secondary | ICD-10-CM

## 2021-12-18 DIAGNOSIS — M542 Cervicalgia: Secondary | ICD-10-CM

## 2021-12-18 NOTE — Progress Notes (Signed)
? ? ?  SUBJECTIVE:  ? ?CHIEF COMPLAINT / HPI: left arm burning sensation ? ?Patient reports burning sensation in left arm that has been ongoing for long time.  Comes and goes. Denies any weakness, recent injury or previous trauma or repetitive motions. Was recently seen by neurology who had ordered MRI head and C-Spine.  Patient reports she needs to call and schedule this. ? ?PERTINENT  PMH / PSH:  ?Cervical radiculopathy ?Rotator cuff impingement ?Carpal Tunnel ?Left arm pain ? ?OBJECTIVE:  ? ?BP 128/74   Pulse 79   Wt 202 lb 6.4 oz (91.8 kg)   LMP 11/06/2021   SpO2 98%   BMI 38.24 kg/m?   ? ?General: Alert, no acute distress ?Neck/Left Shoulder ?No gross deformity, swelling, bruising. ?TTP around neck and left shoulder.  No midline/bony TTP. ?FROM. ?BUE strength 5/5.   ?Sensation intact to light touch.   ?Positive spurlings. ?NV intact distal BUEs.  ? ? ?ASSESSMENT/PLAN:  ? ?Cervicalgia ?Awaiting MRI C Spine results ?Declined Gabapentin ?Follow with Neurology  ?Follow up with PCP if changes mind regarding starting medication ? ?Hypertension ?Well controlled ?Continue current medication ?  ? ? ?Carollee Leitz, MD ?[place]  ?

## 2021-12-18 NOTE — Patient Instructions (Signed)
Thank you for coming to see me today. It was a pleasure.  ? ?Continue current treatment. ?Follow-up with scheduling your MRI of your neck and head ?Follow-up with neurology as scheduled ?If symptoms worsen please follow-up with PCP ? ? ?If you have any questions or concerns, please do not hesitate to call the office at [phone]. ? ?Best,  ? ?Carollee Leitz, MD   ? ?Cervical Radiculopathy ? ?Cervical radiculopathy means that a nerve in the neck (a cervical nerve) is pinched or bruised. This can happen because of an injury to the cervical spine (vertebrae) in the neck, or as a normal part of getting older. This condition can cause pain or loss of feeling (numbness) that runs from your neck all the way down to your arm and fingers. Often, this condition gets better with rest. Treatment may be needed if the condition does not get better. ?What are the causes? ?A neck injury. ?A bulging disk in your spine. ?Sudden muscle tightening (muscle spasms). ?Tight muscles in your neck due to overuse. ?Arthritis. ?Breakdown in the bones and joints of the spine (spondylosis) due to getting older. ?Bone spurs that form near the nerves in the neck. ?What are the signs or symptoms? ?Pain. The pain may: ?Run from the neck to the arm and hand. ?Be very bad or irritating. ?Get worse when you move your neck. ?Loss of feeling or tingling in your arm or hand. ?Weakness in your arm or hand, in very bad cases. ?How is this treated? ?In many cases, treatment is not needed for this condition. With rest, the condition often gets better over time. If treatment is needed, options may include: ?Wearing a soft neck collar (cervical collar) for short periods of time. ?Doing exercises (physical therapy) to strengthen your neck muscles. ?Taking medicines. ?Having shots (injections) in your spine, in very bad cases. ?Having surgery. This may be needed if other treatments do not help. The type of surgery that is used will depend on the cause of your  condition. ?Follow these instructions at home: ?If you have a soft neck collar: ?Wear it as told by your doctor. Take it off only as told by your doctor. ?Ask your doctor if you can take the collar off for cleaning and bathing. If you are allowed to take the collar off for cleaning or bathing: ?Follow instructions from your doctor about how to take off the collar safely. ?Clean the collar by wiping it with mild soap and water and drying it completely. ?Take out any removable pads in the collar every 1-2 days. Wash them by hand with soap and water. Let them air-dry completely before you put them back in the collar. ?Check your skin under the collar for redness or sores. If you see any, tell your doctor. ?Managing pain ? ?  ? ?Take over-the-counter and prescription medicines only as told by your doctor. ?If told, put ice on the painful area. To do this: ?If you have a soft neck collar, take if off as told by your doctor. ?Put ice in a plastic bag. ?Place a towel between your skin and the bag. ?Leave the ice on for 20 minutes, 2-3 times a day. ?Take off the ice if your skin turns bright red. This is very important. If you cannot feel pain, heat, or cold, you have a greater risk of damage to the area. ?If using ice does not help, you can try using heat. Use the heat source that your doctor recommends, such as a  moist heat pack or a heating pad. ?Place a towel between your skin and the heat source. ?Leave the heat on for 20-30 minutes. ?Take off the heat if your skin turns bright red. This is very important. If you cannot feel pain, heat, or cold, you have a greater risk of getting burned. ?You may try a gentle neck and shoulder rub (massage). ?Activity ?Rest as needed. ?Return to your normal activities when your doctor says that it is safe. ?Do exercises as told by your doctor or physical therapist. ?You may have to avoid lifting. Ask your doctor how much you can safely lift. ?General instructions ?Use a flat pillow  when you sleep. ?Do not drive while wearing a soft neck collar. If you do not have a soft neck collar, ask your doctor if it is safe to drive while your neck heals. ?Ask your doctor if you should avoid driving or using machines while you are taking your medicine. ?Do not smoke or use any products that contain nicotine or tobacco. If you need help quitting, ask your doctor. ?Keep all follow-up visits. ?Contact a doctor if: ?Your condition does not get better with treatment. ?Get help right away if: ?Your pain gets worse and medicine does not help. ?You lose feeling or feel weak in your hand, arm, face, or leg. ?You have a high fever. ?Your neck is stiff. ?You cannot control when you poop or pee (have incontinence). ?You have trouble with walking, balance, or talking. ?Summary ?Cervical radiculopathy means that a nerve in the neck is pinched or bruised. ?A nerve can get pinched from a bulging disk, arthritis, an injury to the neck, or other causes. ?Symptoms include pain, tingling, or loss of feeling that goes from the neck to the arm or hand. ?Weakness in your arm or hand can happen in very bad cases. ?Treatment may include resting, wearing a soft neck collar, and doing exercises. You might need to take medicines for pain. In very bad cases, shots or surgery may be needed. ?This information is not intended to replace advice given to you by your health care provider. Make sure you discuss any questions you have with your health care provider. ?Document Revised: 02/01/2021 Document Reviewed: 02/01/2021 ?Elsevier Patient Education ? [place]. ? ?

## 2021-12-19 ENCOUNTER — Encounter: Payer: Self-pay | Admitting: Family Medicine

## 2021-12-19 DIAGNOSIS — M542 Cervicalgia: Secondary | ICD-10-CM | POA: Insufficient documentation

## 2021-12-19 HISTORY — DX: Cervicalgia: M54.2

## 2021-12-19 NOTE — Assessment & Plan Note (Signed)
Well controlled. Continue current medication.  

## 2021-12-19 NOTE — Assessment & Plan Note (Signed)
Awaiting MRI C Spine results ?Declined Gabapentin ?Follow with Neurology  ?Follow up with PCP if changes mind regarding starting medication ?

## 2021-12-19 NOTE — Telephone Encounter (Signed)
Larene Beach from [facility] sent an email that this patient rescheduled 5/17 6pm ? ?

## 2021-12-20 ENCOUNTER — Ambulatory Visit: Payer: Self-pay | Admitting: Psychiatry

## 2021-12-20 ENCOUNTER — Ambulatory Visit
Admission: RE | Admit: 2021-12-20 | Discharge: 2021-12-20 | Disposition: A | Discharge status: Home / Self Care | Payer: Managed Care, Other (non HMO) | Source: Ambulatory Visit | Attending: Radiology | Admitting: Radiology

## 2021-12-20 DIAGNOSIS — D259 Leiomyoma of uterus, unspecified: Secondary | ICD-10-CM

## 2021-12-20 HISTORY — PX: IR RADIOLOGIST EVAL & MGMT: IMG5224

## 2021-12-26 ENCOUNTER — Other Ambulatory Visit: Payer: Self-pay | Admitting: Family Medicine

## 2021-12-26 ENCOUNTER — Ambulatory Visit (INDEPENDENT_AMBULATORY_CARE_PROVIDER_SITE_OTHER): Payer: Managed Care, Other (non HMO) | Admitting: Family Medicine

## 2021-12-26 VITALS — BP 124/68 | HR 68 | Ht 61.0 in | Wt 203.0 lb

## 2021-12-26 DIAGNOSIS — B349 Viral infection, unspecified: Secondary | ICD-10-CM

## 2021-12-26 NOTE — Patient Instructions (Addendum)
It was nice seeing you today! ? ?Try honey for cough. ? ?Use Zyrtec and Flonase. ? ?Let us know if symptoms are not getting better. ? ?Stay well, ?Zola Button, MD ?[place] ?[phone] ? ?-- ? ?Make sure to check out at the front desk before you leave today. ? ?Please arrive at least 15 minutes prior to your scheduled appointments. ? ?If you had blood work today, I will send you a MyChart message or a letter if results are normal. Otherwise, I will give you a call. ? ?If you had a referral placed, they will call you to set up an appointment. Please give [place] a call if you don't hear back in the next 2 weeks. ? ?If you need additional refills before your next appointment, please call your pharmacy first.  ?

## 2021-12-26 NOTE — Progress Notes (Signed)
? ? ?  SUBJECTIVE:  ? ?CHIEF COMPLAINT / HPI:  ?Chief Complaint  ?Patient presents with  ? Cough  ?  ?Mildly productive cough started 5 days ago. Also with sore throat, sneezing, nasal congestion. Also thinks she has been wheezing. Taking dextromethorphan/guaifenesis without significant relief. Denies fever, nausea, vomiting. Otherwise eating/drinking normally. 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 Had several episodes of diarrhea 3-4 days prior to symptom onset, now resolved. Negative Covid test at home. ? ?PERTINENT  PMH / PSH: allergic rhinitis ? ?Patient Care Team: ?Leeanne Rio, MD as PCP - General (Family Medicine)  ? ?OBJECTIVE:  ? ?BP 124/68   Pulse 68   Ht '5\' 1"'$  (1.549 m)   Wt 203 lb (92.1 kg)   LMP 11/12/2021 (Approximate)   SpO2 98%   BMI 38.36 kg/m?   ?Physical Exam ?Constitutional:   ?   General: She is not in acute distress. ?   Appearance: She is obese. She is not ill-appearing or toxic-appearing.  ?HENT:  ?   Head: Normocephalic and atraumatic.  ?   Mouth/Throat:  ?   Mouth: Mucous membranes are moist.  ?   Pharynx: Posterior oropharyngeal erythema present.  ?Cardiovascular:  ?   Rate and Rhythm: Normal rate and regular rhythm.  ?Pulmonary:  ?   Effort: Pulmonary effort is normal. No respiratory distress.  ?   Breath sounds: Normal breath sounds.  ?Musculoskeletal:  ?   Cervical back: Neck supple.  ?Lymphadenopathy:  ?   Cervical: No cervical adenopathy.  ?Neurological:  ?   Mental Status: She is alert.  ?  ? ? ?  12/26/2021  ?  8:30 AM  ?Depression screen PHQ 2/9  ?Decreased Interest 0  ?Down, Depressed, Hopeless 0  ?PHQ - 2 Score 0  ?Altered sleeping 0  ?Tired, decreased energy 0  ?Change in appetite 0  ?Feeling bad or failure about yourself  0  ?Trouble concentrating 0  ?Moving slowly or fidgety/restless 0  ?Suicidal thoughts 0  ?PHQ-9 Score 0  ?  ? ?{Show previous vital signs (optional):23777} ? ? ? ?ASSESSMENT/PLAN:  ? ?Viral illness ?Suspect URI or bronchitis with  viral etiology. Patient is afebrile and overall well-appearing. No wheezing on exam. ?- supportive care, advised to try honey for cough ?- restart cetirizine, Flonase for possible allergic component  ?- consider PFTs if wheezing persists ? ?Return if symptoms worsen or fail to improve.  ? ?Zola Button, MD ?[place]  ?

## 2021-12-27 NOTE — Telephone Encounter (Signed)
Does she think she would be able to do it with Ativan? If not I can place an order for sedated MRI.

## 2021-12-27 NOTE — Telephone Encounter (Signed)
I received this message from [facility]: Patient has tried 2 times to complete her exams and cannot do them. She may need to be referred to the hospital with sedation.   Would you like to have her sent to the hospital for a sedated MRI?

## 2022-01-01 ENCOUNTER — Other Ambulatory Visit: Payer: Self-pay | Admitting: Psychiatry

## 2022-01-01 DIAGNOSIS — M5412 Radiculopathy, cervical region: Secondary | ICD-10-CM

## 2022-01-01 DIAGNOSIS — R519 Headache, unspecified: Secondary | ICD-10-CM

## 2022-01-01 NOTE — Telephone Encounter (Signed)
H&P forms given to pod 2 to fill out

## 2022-01-01 NOTE — Telephone Encounter (Signed)
I've placed new orders for MRI with sedation, thanks

## 2022-01-01 NOTE — Telephone Encounter (Signed)
Patient said that she was not able to do MRI even with medication and she would like to do ahead and do a sedated MRI. I let her know we will get the process started and we will have to set her up with another office visit once the MRI is scheduled.

## 2022-01-09 ENCOUNTER — Ambulatory Visit: Payer: Managed Care, Other (non HMO) | Admitting: Family Medicine

## 2022-01-09 VITALS — BP 124/80 | Ht 61.0 in | Wt 204.0 lb

## 2022-01-09 DIAGNOSIS — M25511 Pain in right shoulder: Secondary | ICD-10-CM | POA: Diagnosis not present

## 2022-01-09 DIAGNOSIS — G8929 Other chronic pain: Secondary | ICD-10-CM
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
## 2022-01-09 NOTE — Patient Instructions (Signed)
We will go ahead with the MRI of your shoulder. If insurance needs anything further before this we will let you know. I will call you with the results and next steps after that.

## 2022-01-10 NOTE — Telephone Encounter (Signed)
H&P forms faxed to [facility] radiology dept

## 2022-01-14 ENCOUNTER — Other Ambulatory Visit: Payer: Self-pay

## 2022-01-15 ENCOUNTER — Encounter: Payer: Self-pay | Admitting: *Deleted

## 2022-01-15 MED ORDER — METOPROLOL SUCCINATE ER 50 MG PO TB24: 50.0000 mg | ORAL_TABLET | Freq: Every evening | ORAL | 1 refills | Status: DC

## 2022-01-17 ENCOUNTER — Ambulatory Visit (INDEPENDENT_AMBULATORY_CARE_PROVIDER_SITE_OTHER): Payer: BC Managed Care – PPO | Admitting: Family Medicine

## 2022-01-17 VITALS — BP 127/71 | HR 75 | Ht 61.0 in | Wt 206.2 lb

## 2022-01-17 DIAGNOSIS — R319 Hematuria, unspecified: Secondary | ICD-10-CM | POA: Diagnosis not present

## 2022-01-17 DIAGNOSIS — N939 Abnormal uterine and vaginal bleeding, unspecified: Secondary | ICD-10-CM

## 2022-01-17 HISTORY — DX: Abnormal uterine and vaginal bleeding, unspecified: N93.9

## 2022-01-17 LAB — POCT URINALYSIS DIP (MANUAL ENTRY)
Bilirubin, UA: NEGATIVE
Glucose, UA: NEGATIVE mg/dL
Ketones, POC UA: NEGATIVE mg/dL
Nitrite, UA: NEGATIVE
Protein Ur, POC: NEGATIVE mg/dL
Spec Grav, UA: 1.015 (ref 1.010–1.025)
Urobilinogen, UA: 0.2 E.U./dL
pH, UA: 7 (ref 5.0–8.0)

## 2022-01-17 LAB — POCT WET PREP (WET MOUNT)
Clue Cells Wet Prep Whiff POC: NEGATIVE
Trichomonas Wet Prep HPF POC: ABSENT

## 2022-01-17 LAB — POCT UA - MICROSCOPIC ONLY

## 2022-01-17 NOTE — Progress Notes (Signed)
    SUBJECTIVE:   CHIEF COMPLAINT / HPI:   Abnormal vaginal bleeding Patient presenting for abnormal vaginal bleeding.  She reports that for the last 2 days she has been using the restroom and when she wiped she noticed a small amount of red blood on the tissue paper.  Denies any pain in her abdomen, vaginal pain or discomfort, itching, abnormal vaginal discharge.  Denies any flank pain.  Denies any history of kidney stones.  Reports that she is not sexually active.  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   OBJECTIVE:   BP 127/71   Pulse 75   Ht '5\' 1"'$  (1.549 m)   Wt 206 lb 4 oz (93.6 kg)   LMP 01/09/2022 (Approximate)   SpO2 100%   BMI 38.97 kg/m   General: Pleasant 51 year old female, no acute distress Cardiac: Regular rate and rhythm Respiratory: Breathing, speaking in full sentences GU: Normal external female genitalia with no lesions or source of bleeding appreciated, normal labia with no abnormalities appreciated, normal internal vaginal tissue with mild to moderate amount of gray clearish discharge.  Cervix is within normal limits as nonfriable, not irritated, nonerythematous. ASSESSMENT/PLAN:   Abnormal vaginal bleeding Unclear etiology for blood on tissue paper.  May be urologic in nature versus vaginal.  No clear source of the bleeding identified on pelvic exam.  Urinalysis collected today showing cloudy urine, small leukocytes, trace intact RBCs.  Micro showing 0-2 RBCs.  Wet prep collected which was negative.  Differential also includes UTI versus kidney stones.  These are both less likely.  Plan on monitoring and recheck urinalysis and 3 to 6 months.     Gifford Shave, MD [place]

## 2022-01-17 NOTE — Assessment & Plan Note (Signed)
Unclear etiology for blood on tissue paper.  May be urologic in nature versus vaginal.  No clear source of the bleeding identified on pelvic exam.  Urinalysis collected today showing cloudy urine, small leukocytes, trace intact RBCs.  Micro showing 0-2 RBCs.  Wet prep collected which was negative.  Differential also includes UTI versus kidney stones.  These are both less likely.  Plan on monitoring and recheck urinalysis and 3 to 6 months.

## 2022-01-17 NOTE — Patient Instructions (Signed)
It was great taking care of you today.  See any cause of your bleeding.  We did check your urine which showed small amount of blood but no need for evaluation by the urologist at this time.  We also collected vaginal samples but did not find any abnormalities.  Lets continue to watch it and if it worsens please be reevaluated.  If you have any questions or concerns call the clinic.  Hope you have a wonderful afternoon!

## 2022-01-28 ENCOUNTER — Encounter (HOSPITAL_COMMUNITY): Payer: Self-pay | Admitting: Orthopedic Surgery

## 2022-01-28 ENCOUNTER — Other Ambulatory Visit: Payer: Self-pay

## 2022-01-28 NOTE — Pre-Procedure Instructions (Signed)
[facility] - [address] [address] Phone: [phone] Fax: [phone]  [id number]- [id number] [id number]: [phone]: [phone]  PCP - MLeeanne Rio MD Cardiologist - RJyl Heinz MD-getting reestablished  EKG - DOS Stress Test - 12/29/17 ECHO - 12/26/17  CPAP: Not required to wear one.  ERAS Protcol - Clears until 0500  Anesthesia review: Y  Patient verbally denies any shortness of breath, fever, cough and chest pain during phone call   -------------  SDW INSTRUCTIONS given:  Your procedure is scheduled on 01/29/22.  [facility]., and check in at the Admitting office.  Call this number if you have problems the morning of surgery:  [phone]   Remember:  Do not eat after midnight the night before your surgery  You may drink clear liquids until 0500 the morning of your surgery.   Clear liquids allowed are: Water, Non-Citrus Juices (without pulp), Carbonated Beverages, Clear Tea, Black Coffee Only, and Gatorade    Take these medicines the morning of surgery with A SIP OF WATER  cetirizine (ZYRTEC ALLERGY) fluticasone (FLONASE)  metoprolol succinate (TOPROL-XL)    As of today, STOP taking any Aspirin (unless otherwise instructed by your surgeon) Aleve, Naproxen, Ibuprofen, Motrin, Advil, Goody's, BC's, all herbal medications, fish oil, and all vitamins.                      Do not wear jewelry, make up, or nail polish            Do not wear lotions, powders, perfumes/colognes, or deodorant.            Do not shave 48 hours prior to surgery.  Men may shave face and neck.            Do not bring valuables to the hospital.            [facility] or [facility].  Do NOT Smoke  (Tobacco/Vaping) 24 hours prior to your procedure If you use a CPAP at night, you may bring all equipment for your overnight stay.   Contacts, glasses, dentures or bridgework may not be worn into surgery.      For patients admitted to the hospital, discharge time will be determined by your treatment team.   Patients discharged the day of surgery will not be allowed to drive home, and someone needs to stay with them for 24 hours.    Special instructions:   [place]- Preparing For Surgery  Before surgery, you can play an important role. Because skin is not sterile, your skin needs to be as free of germs as possible. You can reduce the number of germs on your skin by washing with CHG (chlorahexidine gluconate) Soap before surgery.  CHG is an antiseptic cleaner which kills germs and bonds with the skin to continue killing germs even after washing.    Oral Hygiene is also important to reduce your risk of infection.  Remember - BRUSH YOUR TEETH THE MORNING OF SURGERY WITH YOUR REGULAR TOOTHPASTE  Please do not use if you have an allergy to CHG or antibacterial soaps. If your skin becomes reddened/irritated stop using the CHG.  Do not shave (including legs and underarms) for  at least 48 hours prior to first CHG shower. It is OK to shave your face.  Please follow these instructions carefully.   Shower the NIGHT BEFORE SURGERY and the MORNING OF SURGERY with DIAL Soap.   Pat yourself dry with a CLEAN TOWEL.  Wear CLEAN PAJAMAS to bed the night before surgery  Place CLEAN SHEETS on your bed the night of your first shower and DO NOT SLEEP WITH PETS.   Day of Surgery: Please shower morning of surgery  Wear Clean/Comfortable clothing the morning of surgery Do not apply any deodorants/lotions.   Remember to brush your teeth WITH YOUR REGULAR TOOTHPASTE.   Questions were answered. Patient verbalized understanding of instructions.

## 2022-01-28 NOTE — Telephone Encounter (Signed)
[employer] insurance ended 01/10/22 she now has [employer] no auth required via website

## 2022-01-29 ENCOUNTER — Ambulatory Visit (HOSPITAL_COMMUNITY): Payer: BC Managed Care – PPO | Admitting: Emergency Medicine

## 2022-01-29 ENCOUNTER — Encounter (HOSPITAL_COMMUNITY): Payer: Self-pay

## 2022-01-29 ENCOUNTER — Ambulatory Visit (HOSPITAL_COMMUNITY)
Admission: RE | Admit: 2022-01-29 | Discharge: 2022-01-29 | Disposition: A | Discharge status: Home / Self Care | Payer: BC Managed Care – PPO | Source: Ambulatory Visit | Attending: Psychiatry | Admitting: Psychiatry

## 2022-01-29 ENCOUNTER — Ambulatory Visit (HOSPITAL_COMMUNITY)
Admission: RE | Admit: 2022-01-29 | Discharge: 2022-01-29 | Disposition: A | Discharge status: Home / Self Care | Payer: BC Managed Care – PPO | Source: Home / Self Care | Attending: Family Medicine | Admitting: Family Medicine

## 2022-01-29 ENCOUNTER — Encounter (HOSPITAL_COMMUNITY): Admission: RE | Disposition: A | Discharge status: Home / Self Care | Payer: Self-pay | Source: Home / Self Care

## 2022-01-29 ENCOUNTER — Other Ambulatory Visit: Payer: Self-pay

## 2022-01-29 ENCOUNTER — Ambulatory Visit (HOSPITAL_COMMUNITY)
Admission: RE | Admit: 2022-01-29 | Discharge: 2022-01-29 | Disposition: A | Discharge status: Home / Self Care | Payer: BC Managed Care – PPO | Attending: Psychiatry | Admitting: Psychiatry

## 2022-01-29 DIAGNOSIS — I10 Essential (primary) hypertension: Secondary | ICD-10-CM | POA: Insufficient documentation

## 2022-01-29 DIAGNOSIS — R519 Headache, unspecified: Secondary | ICD-10-CM | POA: Diagnosis not present

## 2022-01-29 DIAGNOSIS — M4802 Spinal stenosis, cervical region: Secondary | ICD-10-CM | POA: Insufficient documentation

## 2022-01-29 DIAGNOSIS — G8929 Other chronic pain: Secondary | ICD-10-CM

## 2022-01-29 DIAGNOSIS — M19011 Primary osteoarthritis, right shoulder: Secondary | ICD-10-CM | POA: Diagnosis not present

## 2022-01-29 DIAGNOSIS — M47812 Spondylosis without myelopathy or radiculopathy, cervical region: Secondary | ICD-10-CM | POA: Insufficient documentation

## 2022-01-29 DIAGNOSIS — Z79899 Other long term (current) drug therapy: Secondary | ICD-10-CM | POA: Insufficient documentation

## 2022-01-29 DIAGNOSIS — Z87891 Personal history of nicotine dependence: Secondary | ICD-10-CM | POA: Diagnosis not present

## 2022-01-29 DIAGNOSIS — M75121 Complete rotator cuff tear or rupture of right shoulder, not specified as traumatic: Secondary | ICD-10-CM | POA: Diagnosis not present

## 2022-01-29 DIAGNOSIS — Z6730 Type AB blood, Rh positive: Secondary | ICD-10-CM | POA: Insufficient documentation

## 2022-01-29 DIAGNOSIS — M5412 Radiculopathy, cervical region: Secondary | ICD-10-CM | POA: Diagnosis not present

## 2022-01-29 DIAGNOSIS — M25511 Pain in right shoulder: Secondary | ICD-10-CM | POA: Diagnosis not present

## 2022-01-29 DIAGNOSIS — M25411 Effusion, right shoulder: Secondary | ICD-10-CM | POA: Diagnosis not present

## 2022-01-29 HISTORY — PX: RADIOLOGY WITH ANESTHESIA: SHX6223

## 2022-01-29 LAB — BASIC METABOLIC PANEL
Anion gap: 12 (ref 5–15)
BUN: 12 mg/dL (ref 6–20)
CO2: 23 mmol/L (ref 22–32)
Calcium: 9.3 mg/dL (ref 8.9–10.3)
Chloride: 103 mmol/L (ref 98–111)
Creatinine, Ser: 0.65 mg/dL (ref 0.44–1.00)
GFR, Estimated: >60 mL/min (ref >60)
Glucose, Bld: 141 mg/dL — ABNORMAL HIGH (ref 70–99)
Potassium: 3.9 mmol/L (ref 3.5–5.1)
Sodium: 138 mmol/L (ref 135–145)

## 2022-01-29 LAB — CBC
HCT: 28.8 % — ABNORMAL LOW (ref 36.0–46.0)
Hemoglobin: 7.9 g/dL — ABNORMAL LOW (ref 12.0–15.0)
MCH: 18.9 pg — ABNORMAL LOW (ref 26.0–34.0)
MCHC: 27.4 g/dL — ABNORMAL LOW (ref 30.0–36.0)
MCV: 69.1 fL — ABNORMAL LOW (ref 80.0–100.0)
Platelets: 335 10*3/uL (ref 150–400)
RBC: 4.17 MIL/uL (ref 3.87–5.11)
RDW: 21.9 % — ABNORMAL HIGH (ref 11.5–15.5)
WBC: 5.2 10*3/uL (ref 4.0–10.5)
nRBC: 0 % (ref 0.0–0.2)

## 2022-01-29 LAB — POCT PREGNANCY, URINE: Preg Test, Ur: NEGATIVE
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
## 2022-01-29 IMAGING — MR MR CERVICAL SPINE W/O CM
4 of 5 series · 19 of 48 positions shown · non-contrast
Comparison: None Available.

CLINICAL DATA: Cervical radiculopathy, no red flags

EXAM:
MRI CERVICAL SPINE WITHOUT CONTRAST
TECHNIQUE: Multiplanar, multisequence MR imaging of the cervical spine was
performed. No intravenous contrast was administered.

[Series 3: T2 · sagittal · 3.0mm · 0.43mm/px · 5 of 16 slices shown (1 of 2)]
[im 1/16]
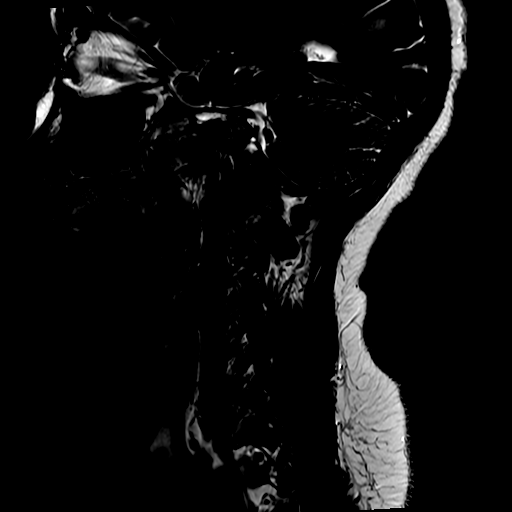
[im 4/16]
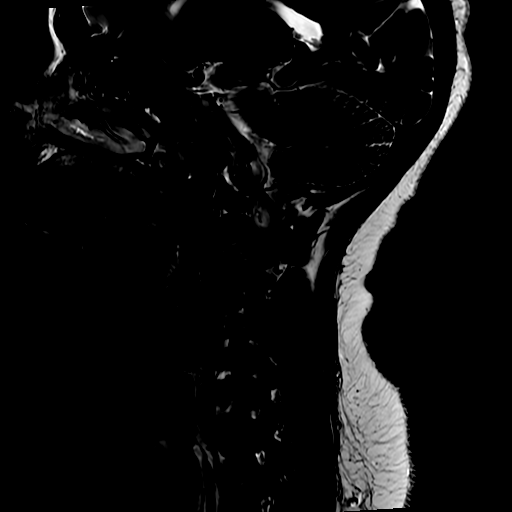
[im 8/16]
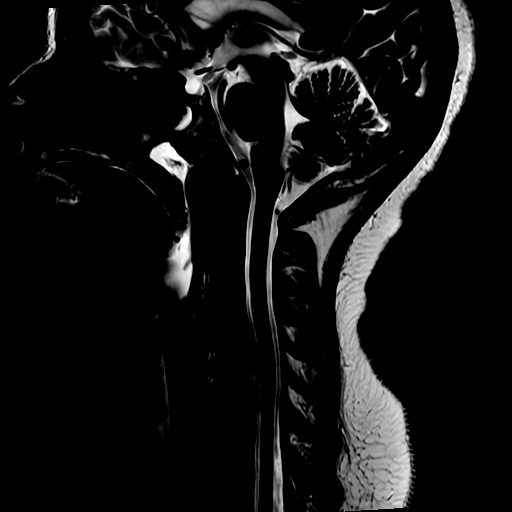
[im 12/16]
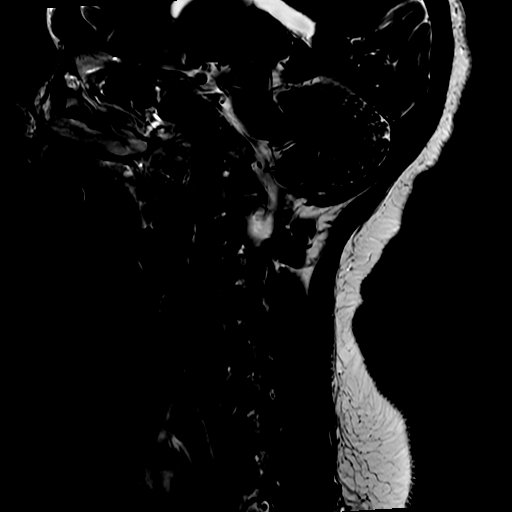
[im 16/16]
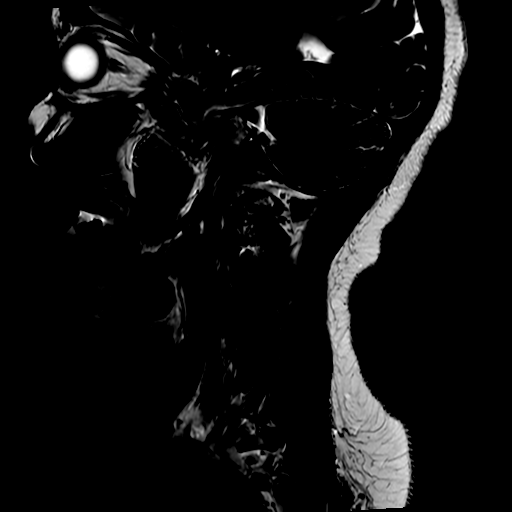

[Series 4: FLAIR · sagittal · 3.0mm · 0.43mm/px · 3 of 16 slices shown]
[im 1/16]
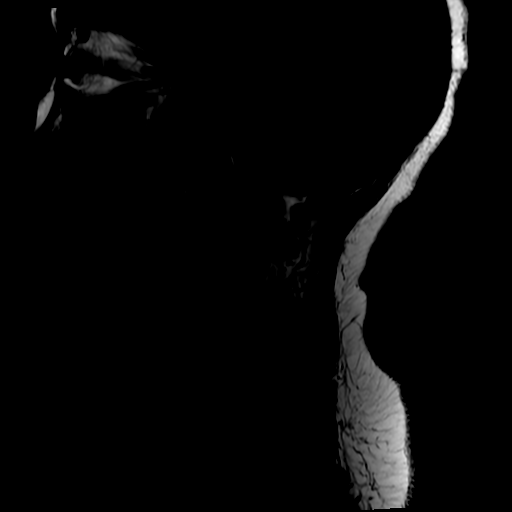
[im 8/16]
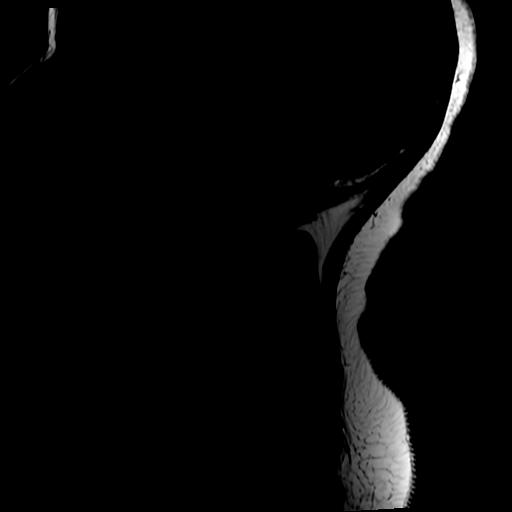
[im 16/16]
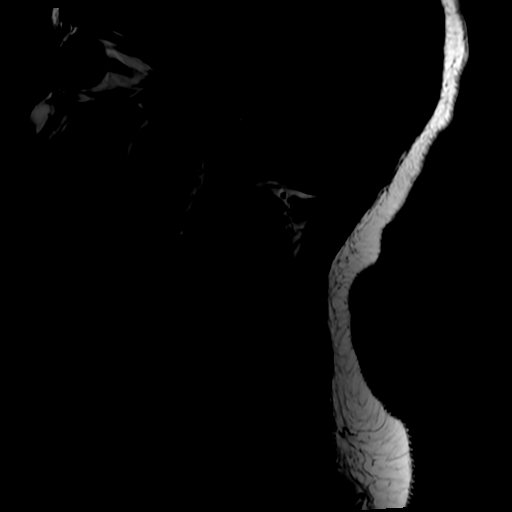

[Series 5: STIR · sagittal · 3.0mm · 0.43mm/px · 3 of 16 slices shown]
[im 1/16]
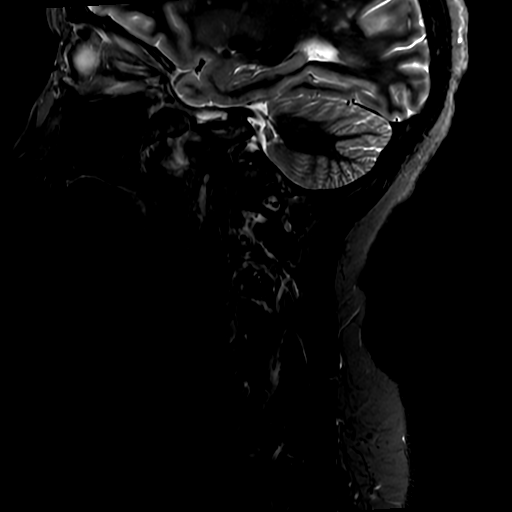
[im 8/16]
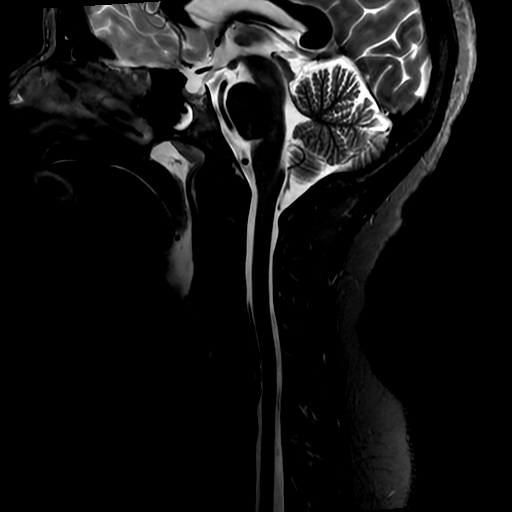
[im 16/16]
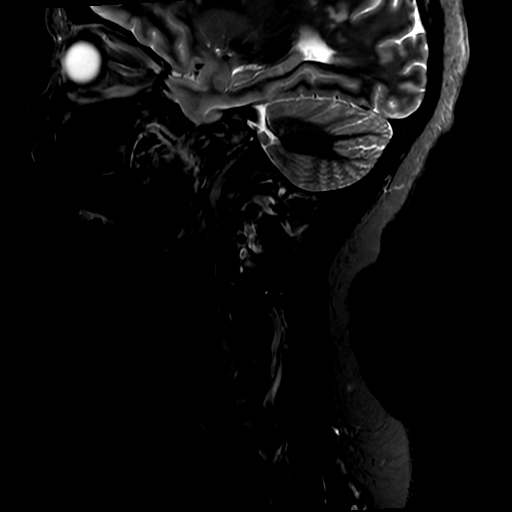

[Series 7: T2 · axial · 3.0mm · 0.35mm/px · z∈[-199,-128]mm · 8 of 28 slices shown (2 of 2)]
[im 1/28]
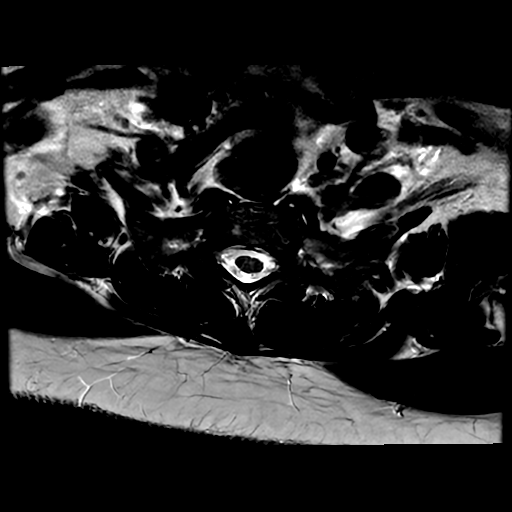
[im 4/28]
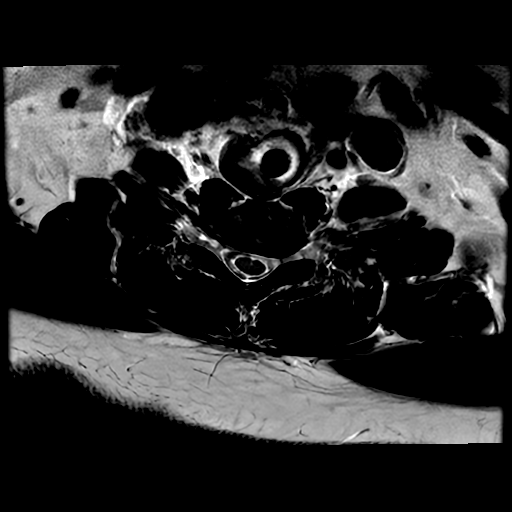
[im 7/28]
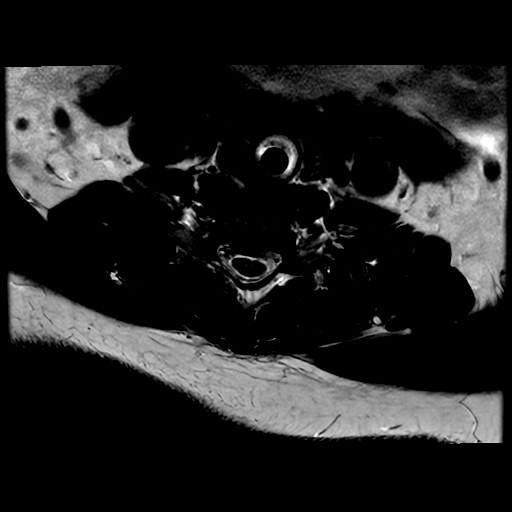
[im 11/28]
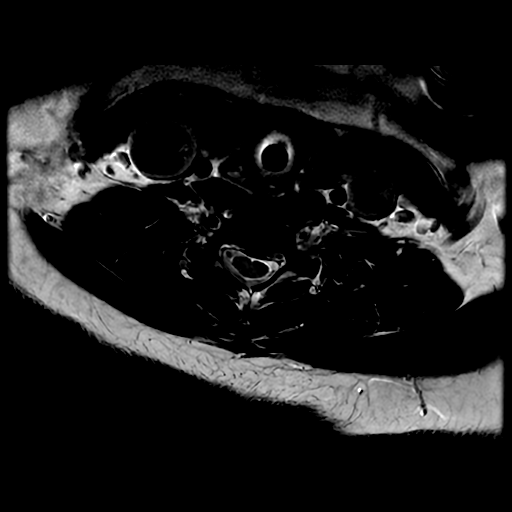
[im 14/28]
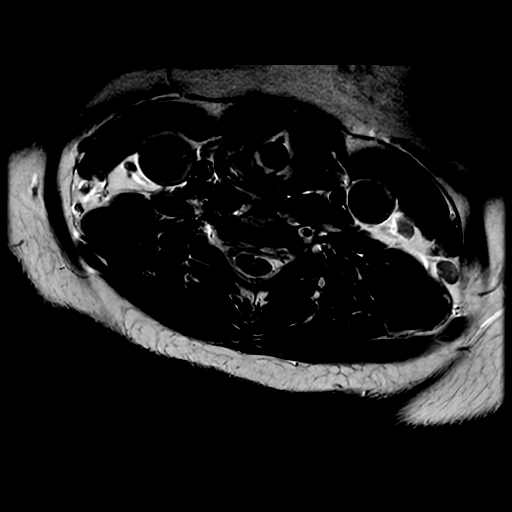
[im 17/28]
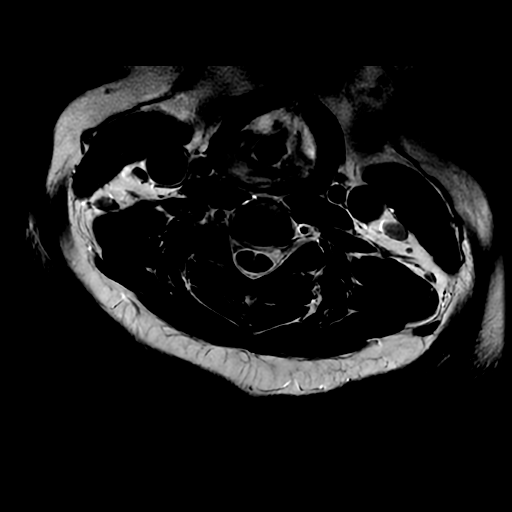
[im 21/28]
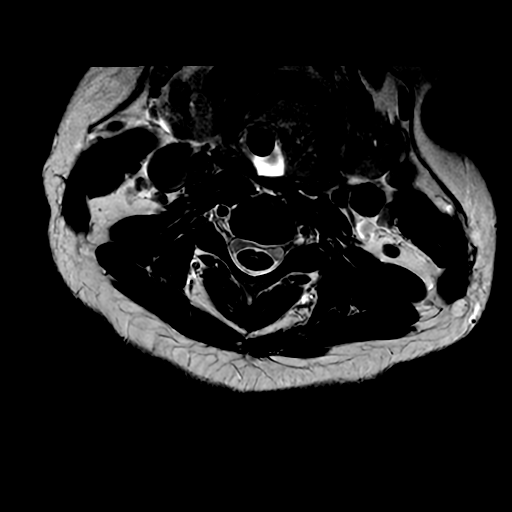
[im 24/28]
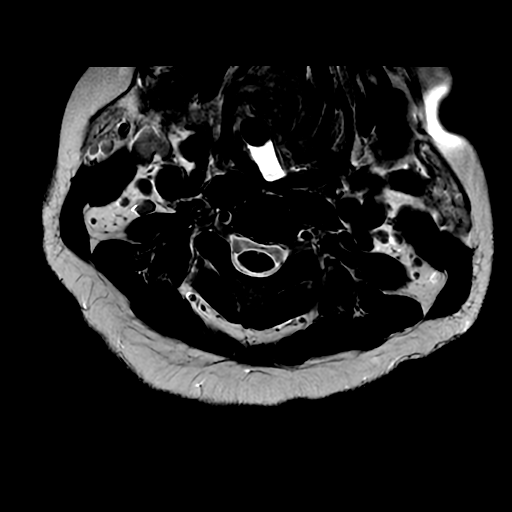

[19 of 48 positions shown; findings below may reference images not displayed]

FINDINGS: Alignment: Straightening of the cervical lordosis with slight
reversal. No significant static listhesis.

Vertebrae: No fracture, evidence of discitis, or bone lesion.

Cord: Normal signal and morphology.

Posterior Fossa, vertebral arteries, paraspinal tissues: Please
refer to dedicated same-day MRI of the brain for evaluation of the
posterior fossa. An endotracheal tube is in place. Vertebral artery
flow voids are maintained.

Disc levels:

C2-C3: No significant disc protrusion, foraminal stenosis, or canal
stenosis.

C3-C4: No significant disc protrusion, foraminal stenosis, or canal
stenosis.

C4-C5: Mild disc osteophyte complex. Minor right facet hypertrophy.
No foraminal or canal stenosis.

C5-C6: Disc osteophyte complex, eccentric to the left with prominent
left-sided uncovertebral spurring. There is mass effect on the left
hemicord with moderate canal stenosis. Moderate to severe left
foraminal stenosis. Mild right foraminal stenosis.

C6-C7: Disc osteophyte complex with bilateral uncovertebral
spurring. Findings result in mild canal stenosis with mild bilateral
foraminal stenosis.

C7-T1: Minimal disc osteophyte complex with left-sided uncovertebral
spurring. Mild left foraminal stenosis. No canal stenosis.
IMPRESSION: 1. Multilevel cervical spondylosis, most pronounced at the C5-6
level where there is moderate canal stenosis and moderate-to-severe
left foraminal stenosis.
2. Mild canal stenosis and mild bilateral foraminal stenosis at
C6-7.
3. Mild left foraminal stenosis at C7-T1.

## 2022-01-29 IMAGING — MR MR HEAD WO/W CM
6 of 12 series · 26 of 48 positions shown · IV contrast (gadavist)
Comparison: CT head [DATE]

CLINICAL DATA: Headache beginning 3 weeks ago

EXAM:
MRI HEAD WITHOUT AND WITH CONTRAST
TECHNIQUE: Multiplanar, multiecho pulse sequences of the brain and surrounding
structures were obtained without and with intravenous contrast.
CONTRAST:  10mL GADAVIST GADOBUTROL 1 MMOL/ML IV SOLN

[Series 2: DWI · axial · 3.0mm · 0.94mm/px · z∈[-90,+36]mm · 8 of 88 slices shown (1 of 2)]
[im 1/88]
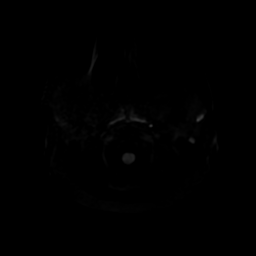
[im 13/88]
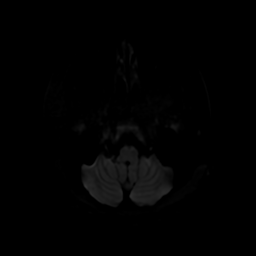
[im 25/88]
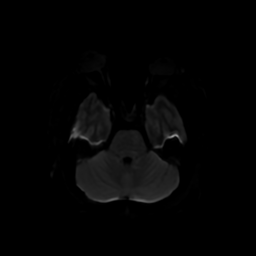
[im 38/88]
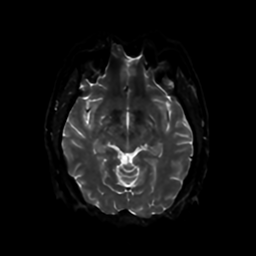
[im 50/88]
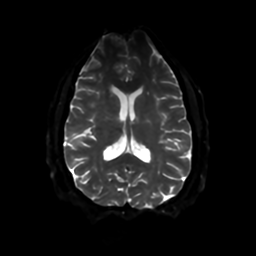
[im 63/88]
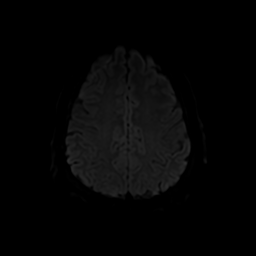
[im 75/88]
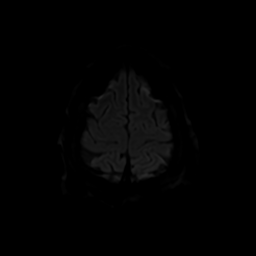
[im 88/88]
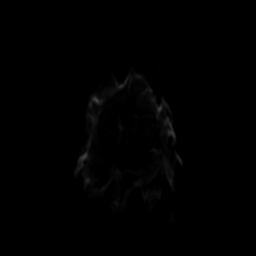

[Series 3: DWI · coronal · 4.0mm · 0.94mm/px · 6 of 68 slices shown (2 of 2)]
[im 1/68]
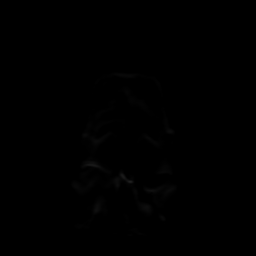
[im 14/68]
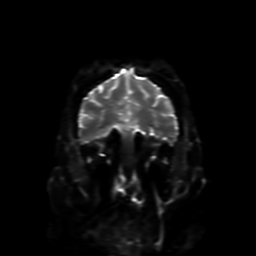
[im 27/68]
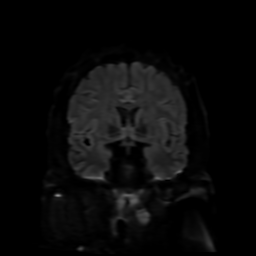
[im 41/68]
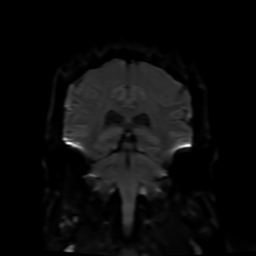
[im 54/68]
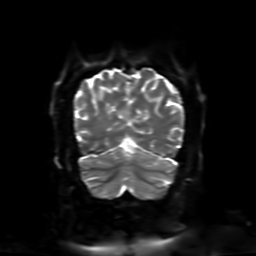
[im 68/68]
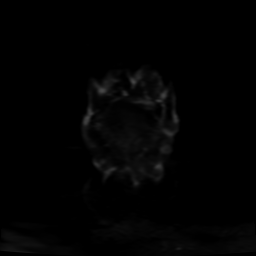

[Series 4: FLAIR · sagittal · 5.0mm · 0.23mm/px · 2 of 23 slices shown (1 of 2)]
[im 1/23]
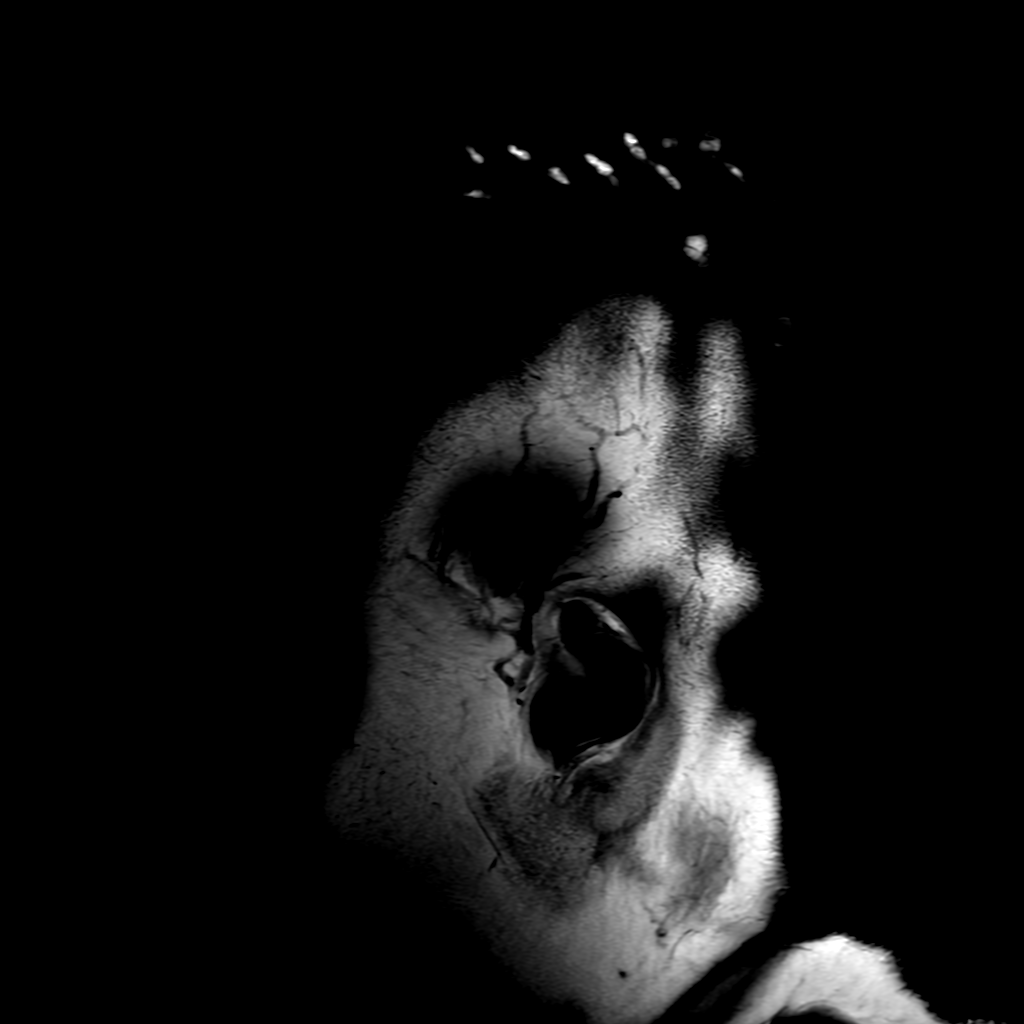
[im 23/23]
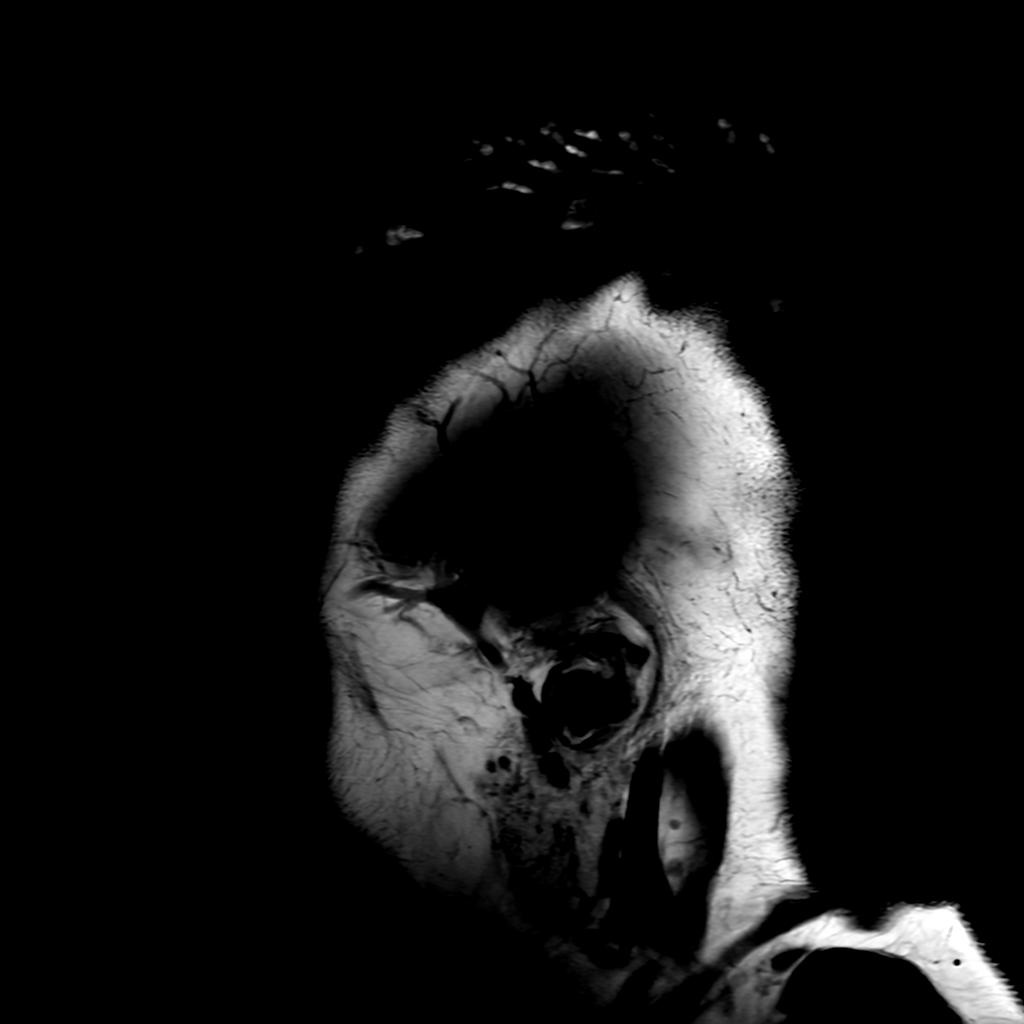

[Series 6: FLAIR · axial · 4.0mm · 0.45mm/px · z∈[-89,+35]mm · 3 of 30 slices shown (2 of 2)]
[im 1/30]
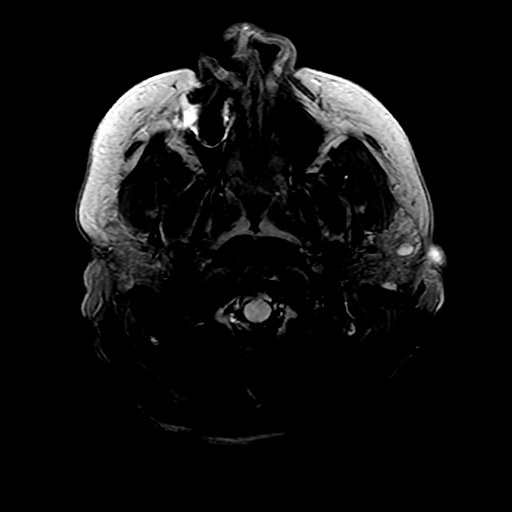
[im 15/30]
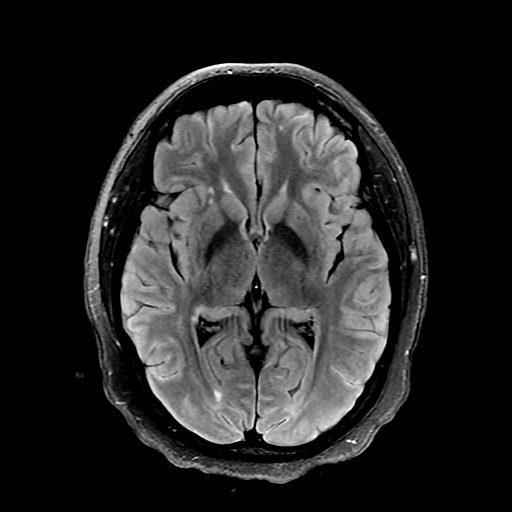
[im 30/30]
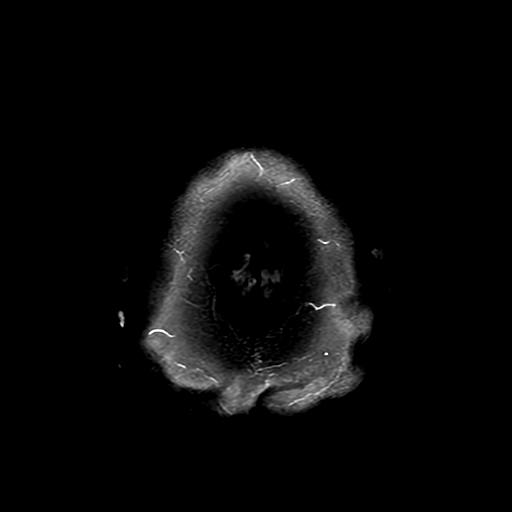

[Series 250: ADC · axial · 3.0mm · 0.94mm/px · z∈[-90,+36]mm · 4 of 42 slices shown (1 of 2)]
[im 1/42]
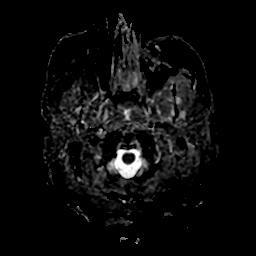
[im 14/42]
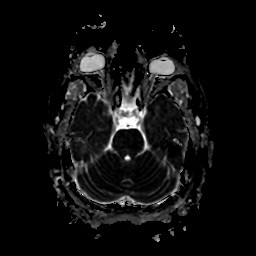
[im 28/42]
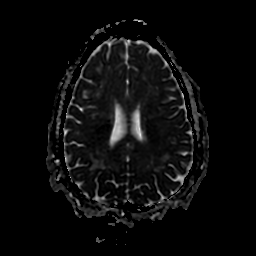
[im 42/42]
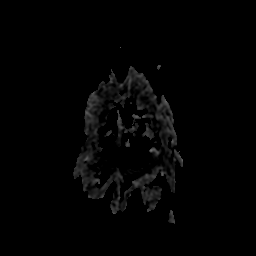

[Series 350: ADC · coronal · 4.0mm · 0.94mm/px · 3 of 34 slices shown (2 of 2)]
[im 1/34]
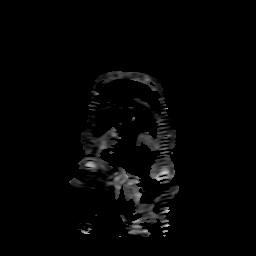
[im 17/34]
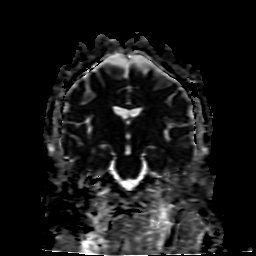
[im 34/34]
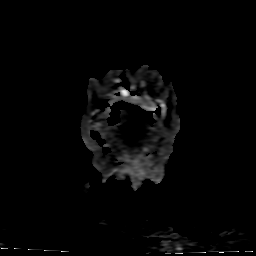

[26 of 48 positions shown; findings below may reference images not displayed]

FINDINGS: Brain: There is no acute intracranial hemorrhage, extra-axial fluid
collection, or acute infarct.

Parenchymal volume is normal. The ventricles are normal in size.
Gray-white differentiation is preserved.

There are patchy foci of FLAIR signal abnormality throughout the
subcortical and periventricular white matter, greater than expected
for age. There is no abnormal enhancement. There is no mass lesion.
There is no mass effect or midline shift.

Vascular: Normal flow voids.

Skull and upper cervical spine: Normal marrow signal.

Sinuses/Orbits: The paranasal sinuses are clear. The globes and
orbits are unremarkable.

Other: None.
IMPRESSION: 1. Scattered FLAIR signal abnormality throughout the subcortical and
periventricular white matter is nonspecific but may reflect sequela
of chronic small vessel ischemia (this would be significantly
accelerated for age), chronic migraines, demyelinating disease,
vasculitis, or sequela of prior insult.
2. No acute intracranial pathology or abnormal enhancement.

## 2022-01-29 SURGERY — MRI WITH ANESTHESIA
Anesthesia: General

## 2022-01-29 MED ORDER — MIDAZOLAM HCL 2 MG/2ML IJ SOLN
INTRAMUSCULAR | Status: DC | PRN
  Administered 2022-01-29: 2 mg via INTRAVENOUS

## 2022-01-29 MED ORDER — GADOBUTROL 1 MMOL/ML IV SOLN
10.0000 mL | Freq: Once | INTRAVENOUS | Status: AC | PRN
Start: 2022-01-29 — End: 2022-01-29
  Administered 2022-01-29: 10 mL via INTRAVENOUS

## 2022-01-29 MED ORDER — CHLORHEXIDINE GLUCONATE 0.12 % MT SOLN
15.0000 mL | Freq: Once | OROMUCOSAL | Status: AC
  Administered 2022-01-29: 15 mL via OROMUCOSAL
  Filled 2022-01-29: qty 15

## 2022-01-29 MED ORDER — PHENYLEPHRINE HCL-NACL 20-0.9 MG/250ML-% IV SOLN
INTRAVENOUS | Status: DC | PRN
  Administered 2022-01-29: 30 ug/min via INTRAVENOUS

## 2022-01-29 MED ORDER — ORAL CARE MOUTH RINSE: 15.0000 mL | Freq: Once | OROMUCOSAL | Status: AC

## 2022-01-29 MED ORDER — ROCURONIUM BROMIDE 10 MG/ML (PF) SYRINGE
PREFILLED_SYRINGE | INTRAVENOUS | Status: DC | PRN
  Administered 2022-01-29: 100 mg via INTRAVENOUS

## 2022-01-29 MED ORDER — EPHEDRINE SULFATE-NACL 50-0.9 MG/10ML-% IV SOSY
PREFILLED_SYRINGE | INTRAVENOUS | Status: DC | PRN
  Administered 2022-01-29: 15 mg via INTRAVENOUS
  Administered 2022-01-29: 10 mg via INTRAVENOUS

## 2022-01-29 MED ORDER — PROPOFOL 10 MG/ML IV BOLUS
INTRAVENOUS | Status: DC | PRN
  Administered 2022-01-29: 150 mg via INTRAVENOUS

## 2022-01-29 MED ORDER — ONDANSETRON HCL 4 MG/2ML IJ SOLN
INTRAMUSCULAR | Status: DC | PRN
  Administered 2022-01-29: 4 mg via INTRAVENOUS

## 2022-01-29 MED ORDER — LACTATED RINGERS IV SOLN: INTRAVENOUS | Status: DC

## 2022-01-29 MED ORDER — LIDOCAINE 2% (20 MG/ML) 5 ML SYRINGE
INTRAMUSCULAR | Status: DC | PRN
  Administered 2022-01-29: 100 mg via INTRAVENOUS

## 2022-01-29 MED ORDER — SUGAMMADEX SODIUM 200 MG/2ML IV SOLN
INTRAVENOUS | Status: DC | PRN
  Administered 2022-01-29: 200 mg via INTRAVENOUS

## 2022-01-29 MED ORDER — DEXAMETHASONE SODIUM PHOSPHATE 10 MG/ML IJ SOLN
INTRAMUSCULAR | Status: DC | PRN
  Administered 2022-01-29: 10 mg via INTRAVENOUS

## 2022-01-29 MED ORDER — FENTANYL CITRATE (PF) 250 MCG/5ML IJ SOLN
INTRAMUSCULAR | Status: DC | PRN
  Administered 2022-01-29: 100 ug via INTRAVENOUS

## 2022-01-29 NOTE — Progress Notes (Signed)
Unable to do MRI right shoulder with MRI cervical and brain today due to being unsure of pre-certification status. Pt would like to proceed with cervical and brain MRI today. Instructed her to call insurance about pre-certification before arrival to the hospital next time.

## 2022-01-29 NOTE — Anesthesia Preprocedure Evaluation (Signed)
Anesthesia Evaluation  Patient identified by MRN, date of birth, ID band Patient awake    Reviewed: Allergy & Precautions, NPO status , Patient's Chart, lab work & pertinent test results  Airway Mallampati: II  TM Distance: >3 FB Neck ROM: Full    Dental no notable dental hx.    Pulmonary sleep apnea , former smoker,    Pulmonary exam normal breath sounds clear to auscultation       Cardiovascular hypertension, Pt. on medications negative cardio ROS Normal cardiovascular exam Rhythm:Regular Rate:Normal     Neuro/Psych negative neurological ROS  negative psych ROS   GI/Hepatic negative GI ROS, Neg liver ROS,   Endo/Other  negative endocrine ROS  Renal/GU negative Renal ROS  negative genitourinary   Musculoskeletal  (+) Arthritis , Osteoarthritis,    Abdominal (+) + obese,   Peds negative pediatric ROS (+)  Hematology  (+) Blood dyscrasia, anemia ,   Anesthesia Other Findings   Reproductive/Obstetrics negative OB ROS                             Anesthesia Physical Anesthesia Plan  ASA: 2  Anesthesia Plan: General   Post-op Pain Management: Minimal or no pain anticipated   Induction: Intravenous  PONV Risk Score and Plan: 3 and Ondansetron, Dexamethasone, Midazolam and Treatment may vary due to age or medical condition  Airway Management Planned: LMA and Oral ETT  Additional Equipment:   Intra-op Plan:   Post-operative Plan: Extubation in OR  Informed Consent: I have reviewed the patients History and Physical, chart, labs and discussed the procedure including the risks, benefits and alternatives for the proposed anesthesia with the patient or authorized representative who has indicated his/her understanding and acceptance.     Dental advisory given  Plan Discussed with: CRNA  Anesthesia Plan Comments:         Anesthesia Quick Evaluation

## 2022-01-29 NOTE — Anesthesia Procedure Notes (Signed)
Procedure Name: Intubation Date/Time: 01/29/2022 9:21 AM  Performed by: Lance Coon, CRNAPre-anesthesia Checklist: Patient identified, Timeout performed, Emergency Drugs available, Suction available and Patient being monitored Patient Re-evaluated:Patient Re-evaluated prior to induction Oxygen Delivery Method: Circle system utilized Induction Type: IV induction Ventilation: Mask ventilation without difficulty Laryngoscope Size: Miller and 3 Grade View: Grade II Tube type: Oral Tube size: 7.0 mm Number of attempts: 1 Airway Equipment and Method: Stylet Placement Confirmation: ETT inserted through vocal cords under direct vision, positive ETCO2 and breath sounds checked- equal and bilateral Secured at: 21 cm Tube secured with: Tape Dental Injury: Teeth and Oropharynx as per pre-operative assessment

## 2022-01-29 NOTE — Anesthesia Postprocedure Evaluation (Signed)
Anesthesia Post Note  Patient: Adrienne Collins  Procedure(s) Performed: CERVICAL SPINE WITHOUT CONTRAST,BRAIN WITH AND WITHOUT CONTRAST     Patient location during evaluation: Phase II Anesthesia Type: General Level of consciousness: awake Pain management: pain level controlled Vital Signs Assessment: post-procedure vital signs reviewed and stable Respiratory status: spontaneous breathing Cardiovascular status: stable Postop Assessment: no apparent nausea or vomiting Anesthetic complications: no   No notable events documented.  Last Vitals:  Vitals:   01/29/22 1115 01/29/22 1130  BP: (!) 105/59 119/71  Pulse: 88 88  Resp: 19 20  Temp:  37 C  SpO2: 93% 96%    Last Pain:  Vitals:   01/29/22 1100  TempSrc:   PainSc: 0-No pain                 Huston Foley

## 2022-01-29 NOTE — Transfer of Care (Signed)
Immediate Anesthesia Transfer of Care Note  Patient: Adrienne Collins  Procedure(s) Performed: CERVICAL SPINE WITHOUT CONTRAST,BRAIN WITH AND WITHOUT CONTRAST  Patient Location: PACU  Anesthesia Type:General  Level of Consciousness: drowsy and patient cooperative  Airway & Oxygen Therapy: Patient Spontanous Breathing  Post-op Assessment: Report given to RN and Post -op Vital signs reviewed and stable  Post vital signs: Reviewed and stable  Last Vitals:  Vitals Value Taken Time  BP 95/48 01/29/22 1059  Temp    Pulse 89 01/29/22 1059  Resp 29 01/29/22 1059  SpO2 92 % 01/29/22 1059  Vitals shown include unvalidated device data.  Last Pain:  Vitals:   01/29/22 0634  TempSrc:   PainSc: 0-No pain         Complications: No notable events documented.

## 2022-01-30 ENCOUNTER — Encounter (HOSPITAL_COMMUNITY): Payer: Self-pay | Admitting: Radiology

## 2022-01-31 DIAGNOSIS — L218 Other seborrheic dermatitis: Secondary | ICD-10-CM | POA: Diagnosis not present

## 2022-01-31 DIAGNOSIS — L258 Unspecified contact dermatitis due to other agents: Secondary | ICD-10-CM | POA: Diagnosis not present

## 2022-02-13 ENCOUNTER — Ambulatory Visit: Payer: Managed Care, Other (non HMO) | Admitting: Cardiology

## 2022-03-04 DIAGNOSIS — M25511 Pain in right shoulder: Secondary | ICD-10-CM | POA: Diagnosis not present

## 2022-03-11 ENCOUNTER — Ambulatory Visit: Payer: Managed Care, Other (non HMO) | Admitting: Cardiology

## 2022-04-02 ENCOUNTER — Other Ambulatory Visit: Payer: Self-pay

## 2022-04-02 DIAGNOSIS — R0981 Nasal congestion: Secondary | ICD-10-CM

## 2022-04-02 MED ORDER — ENALAPRIL-HYDROCHLOROTHIAZIDE 5-12.5 MG PO TABS
1.0000 | ORAL_TABLET | Freq: Every day | ORAL | 1 refills | Status: DC
Start: 2022-04-02 — End: 2022-06-11

## 2022-04-02 MED ORDER — FLUTICASONE PROPIONATE 50 MCG/ACT NA SUSP: 2.0000 | Freq: Every day | NASAL | 6 refills | Status: AC

## 2022-04-03 NOTE — Addendum Note (Signed)
Addended by: Dorna Bloom on: 04/03/2022 09:03 AM   Modules accepted: Orders

## 2022-04-05 MED ORDER — METOPROLOL SUCCINATE ER 50 MG PO TB24
50.0000 mg | ORAL_TABLET | Freq: Every evening | ORAL | 1 refills | Status: DC
Start: 2022-04-05 — End: 2022-08-19

## 2022-04-09 DIAGNOSIS — Z1231 Encounter for screening mammogram for malignant neoplasm of breast: Secondary | ICD-10-CM | POA: Diagnosis not present

## 2022-04-10 ENCOUNTER — Ambulatory Visit: Payer: BC Managed Care – PPO | Attending: Cardiology | Admitting: Cardiology

## 2022-04-12 DIAGNOSIS — N6001 Solitary cyst of right breast: Secondary | ICD-10-CM | POA: Diagnosis not present

## 2022-04-12 DIAGNOSIS — R922 Inconclusive mammogram: Secondary | ICD-10-CM | POA: Diagnosis not present

## 2022-04-24 ENCOUNTER — Encounter: Payer: Self-pay | Admitting: Psychiatry

## 2022-04-24 ENCOUNTER — Ambulatory Visit: Payer: BC Managed Care – PPO | Admitting: Psychiatry

## 2022-04-24 NOTE — Progress Notes (Deleted)
   CC:  headaches  Follow-up Visit  Last visit: 11/20/21  Brief HPI: 51 year old female with a history of HTN, OSA, rotator cuff tear, prediabetes, iron deficiency anemia who follows in clinic for headaches and neck pain.  At her last visit, MRI brain and C-spine were ordered. She was referred to neck PT. Interval History: Headaches***  CPAP***  MRI C-spine 01/29/22 showed multilevel cervical spondylosis with moderate canal stenosis and moderate-severe left foraminal stenosis at C5-6, mild canal/foraminal stenosis at C6-7, and mild left foraminal stenosis at C7-T1.  Neck PT***  MRI brain 01/29/22 showed nonspecific white matter changes and was otherwise unremarkable.  Headache days per month: *** Headache free days per month: *** Headache severity: ***  Current Headache Regimen: Preventative: *** Abortive: ***  # of doses of abortive medications per month: ***  Prior Therapies                                  ***  Physical Exam:   Vital Signs: There were no vitals taken for this visit. GENERAL:  well appearing, in no acute distress, alert  SKIN:  Color, texture, turgor normal. No rashes or lesions HEAD:  Normocephalic/atraumatic. RESP: normal respiratory effort MSK:  No gross joint deformities.   NEUROLOGICAL: Mental Status: Alert, oriented to person, place and time, Follows commands, and Speech fluent and appropriate. Cranial Nerves: PERRL, face symmetric, no dysarthria, hearing grossly intact Motor: moves all extremities equally Gait: normal-based.  IMPRESSION: ***  PLAN: ***   Follow-up: ***  I spent a total of *** minutes on the date of the service. Headache education was done. Discussed lifestyle modification including increased oral hydration, decreased caffeine, exercise and stress management. Discussed treatment options including preventive and acute medications, natural supplements, and infusion therapy. Discussed medication overuse headache and to  limit use of acute treatments to no more than 2 days/week or 10 days/month. Discussed medication side effects, adverse reactions and drug interactions. Written educational materials and patient instructions outlining all of the above were given.  Genia Harold, MD

## 2022-04-25 ENCOUNTER — Encounter: Payer: Self-pay | Admitting: Family Medicine

## 2022-04-29 ENCOUNTER — Telehealth: Payer: BC Managed Care – PPO | Admitting: Psychiatry

## 2022-04-29 NOTE — Progress Notes (Deleted)
   CC:  headaches  Follow-up Visit  Last visit: 11/20/21  Brief HPI: 51 year old female with a history of HTN, OSA, rotator cuff tear with chronic right shoulder pain, prediabetes, iron deficiency anemia who follows in clinic for headaches.  At her last visit, MRI brain and C-spine were ordered. Referral to neck PT was placed.  Interval History: Headaches***  Physical therapy***  Brain MRI 01/29/22 showed nonspecific white matter changes, accelerated for age. MRI C-spine showed moderate canal stenosis and moderate-severe left foraminal stenosis at C5-6.   Headache days per month: *** Headache free days per month: *** Headache severity: ***  Current Headache Regimen: Preventative: *** Abortive: ***  # of doses of abortive medications per month: ***  Prior Therapies                                  Metoprolol 50 mg daily  Physical Exam:   Vital Signs: There were no vitals taken for this visit. GENERAL:  well appearing, in no acute distress, alert  SKIN:  Color, texture, turgor normal. No rashes or lesions HEAD:  Normocephalic/atraumatic. RESP: normal respiratory effort MSK:  No gross joint deformities.   NEUROLOGICAL: Mental Status: Alert, oriented to person, place and time, Follows commands, and Speech fluent and appropriate. Cranial Nerves: PERRL, face symmetric, no dysarthria, hearing grossly intact Motor: moves all extremities equally Gait: normal-based.  IMPRESSION: ***  PLAN: ***   Follow-up: ***  I spent a total of *** minutes on the date of the service. Headache education was done. Discussed lifestyle modification including increased oral hydration, decreased caffeine, exercise and stress management. Discussed treatment options including preventive and acute medications, natural supplements, and infusion therapy. Discussed medication overuse headache and to limit use of acute treatments to no more than 2 days/week or 10 days/month. Discussed medication  side effects, adverse reactions and drug interactions. Written educational materials and patient instructions outlining all of the above were given.  Genia Harold, MD

## 2022-06-07 ENCOUNTER — Ambulatory Visit: Payer: BC Managed Care – PPO | Admitting: Student

## 2022-06-08 ENCOUNTER — Other Ambulatory Visit: Payer: Self-pay | Admitting: Family Medicine

## 2022-06-10 ENCOUNTER — Encounter: Payer: BC Managed Care – PPO | Admitting: Family Medicine

## 2022-06-12 ENCOUNTER — Ambulatory Visit: Payer: BC Managed Care – PPO | Admitting: Cardiology

## 2022-06-14 ENCOUNTER — Ambulatory Visit: Payer: BC Managed Care – PPO | Admitting: Family Medicine

## 2022-06-14 ENCOUNTER — Encounter: Payer: Self-pay | Admitting: Family Medicine

## 2022-06-14 VITALS — BP 136/73 | HR 79 | Ht 61.0 in | Wt 195.2 lb

## 2022-06-14 DIAGNOSIS — B9689 Other specified bacterial agents as the cause of diseases classified elsewhere: Secondary | ICD-10-CM

## 2022-06-14 DIAGNOSIS — J329 Chronic sinusitis, unspecified: Secondary | ICD-10-CM | POA: Diagnosis not present

## 2022-06-14 DIAGNOSIS — Z23 Encounter for immunization: Secondary | ICD-10-CM

## 2022-06-14 MED ORDER — FLUCONAZOLE 150 MG PO TABS: 150.0000 mg | ORAL_TABLET | Freq: Once | ORAL | 0 refills | Status: AC

## 2022-06-14 MED ORDER — AMOXICILLIN-POT CLAVULANATE 875-125 MG PO TABS: 1.0000 | ORAL_TABLET | Freq: Two times a day (BID) | ORAL | 0 refills | Status: AC

## 2022-06-14 NOTE — Patient Instructions (Addendum)
It was nice seeing you today!  Take Augmentin twice a day for 5 days.  I am sending you a yeast infection medication to have just in case.  Stay well, Zola Button, MD [facility] [phone]  --  Make sure to check out at the front desk before you leave today.  Please arrive at least 15 minutes prior to your scheduled appointments.  If you had blood work today, I will send you a MyChart message or a letter if results are normal. Otherwise, I will give you a call.  If you had a referral placed, they will call you to set up an appointment. Please give [place] a call if you don't hear back in the next 2 weeks.  If you need additional refills before your next appointment, please call your pharmacy first.

## 2022-06-14 NOTE — Progress Notes (Signed)
    SUBJECTIVE:   CHIEF COMPLAINT / HPI:  Chief Complaint  Patient presents with   Nasal Congestion    X 2weeks    Started feeling ill 2 weeks ago with congestion, sneezing, cough, fatigue. Denies fever. No sick contacts. Was taking liquid Tussin. Negative Covid test at home.  PERTINENT  PMH / PSH: Allergic rhinitis  Patient Care Team: Leeanne Rio, MD as PCP - General (Family Medicine)   OBJECTIVE:   BP 136/73   Pulse 79   Ht '5\' 1"'$  (1.549 m)   Wt 195 lb 4 oz (88.6 kg)   LMP 05/31/2022   SpO2 100%   BMI 36.89 kg/m   Physical Exam Constitutional:      General: She is not in acute distress. HENT:     Head: Normocephalic and atraumatic.     Comments: No frontal or maxillary sinus tenderness    Nose: Congestion present.     Comments: Mild swelling of bilateral turbinates    Mouth/Throat:     Mouth: Mucous membranes are moist.     Pharynx: Oropharynx is clear. No oropharyngeal exudate or posterior oropharyngeal erythema.  Cardiovascular:     Rate and Rhythm: Normal rate and regular rhythm.     Heart sounds: Normal heart sounds.  Pulmonary:     Effort: Pulmonary effort is normal. No respiratory distress.     Breath sounds: Normal breath sounds.  Musculoskeletal:     Cervical back: Neck supple.  Neurological:     Mental Status: She is alert.         06/14/2022    2:22 PM  Depression screen PHQ 2/9  Decreased Interest 0  Down, Depressed, Hopeless 0  PHQ - 2 Score 0  Altered sleeping 0  Tired, decreased energy 0  Change in appetite 0  Feeling bad or failure about yourself  0  Trouble concentrating 0  Moving slowly or fidgety/restless 0  Suicidal thoughts 0  PHQ-9 Score 0     {Show previous vital signs (optional):23777}    ASSESSMENT/PLAN:   Bacterial sinusitis Patient with ongoing upper respiratory symptoms for greater than 2 weeks, reasonable to treat for bacterial sinusitis. -Amoxicillin-clavulanate x5 days -fluconazole prescribed per  patient request, usually gets yeast infection after antibiotics   HCM  Flu shot given  Return if symptoms worsen or fail to improve.   Zola Button, MD [facility]

## 2022-06-17 ENCOUNTER — Ambulatory Visit: Payer: BC Managed Care – PPO | Admitting: Family Medicine

## 2022-06-18 ENCOUNTER — Ambulatory Visit: Payer: BC Managed Care – PPO

## 2022-06-20 ENCOUNTER — Ambulatory Visit (INDEPENDENT_AMBULATORY_CARE_PROVIDER_SITE_OTHER): Payer: BC Managed Care – PPO | Admitting: Obstetrics and Gynecology

## 2022-06-20 ENCOUNTER — Encounter: Payer: Self-pay | Admitting: Obstetrics and Gynecology

## 2022-06-20 VITALS — BP 117/66 | HR 70 | Resp 16 | Ht 61.0 in | Wt 196.0 lb

## 2022-06-20 DIAGNOSIS — N920 Excessive and frequent menstruation with regular cycle: Secondary | ICD-10-CM

## 2022-06-20 DIAGNOSIS — D219 Benign neoplasm of connective and other soft tissue, unspecified: Secondary | ICD-10-CM | POA: Diagnosis not present

## 2022-06-20 LAB — POCT URINALYSIS DIP (DEVICE)
Bilirubin Urine: NEGATIVE
Glucose, UA: NEGATIVE mg/dL
Hgb urine dipstick: NEGATIVE
Ketones, ur: NEGATIVE mg/dL
Leukocytes,Ua: NEGATIVE
Nitrite: NEGATIVE
Protein, ur: NEGATIVE mg/dL
Specific Gravity, Urine: 1.02 (ref 1.005–1.030)
Urobilinogen, UA: 0.2 mg/dL (ref 0.0–1.0)
pH: 7 (ref 5.0–8.0)

## 2022-06-20 NOTE — Progress Notes (Signed)
NEW GYNECOLOGY PATIENT Patient name: Adrienne Collins MRN [id number]  Date of birth: [birth year] Chief Complaint:   Menorrhagia     History:  Adrienne Collins is a 51 y.o. G2P0 being seen today for followo up  S/p [place] 11/2021  Presents with irregular bleeding, snow s/p UFE 11/2021. When she wipes she sees light brown pieces. Continues to have regular cycles, sometimes 2 a month. Cycles are 7 days  with more bleeding 4-5 days later. Flow is moderate, changing pads every 3-4 hours with mild cramping - this is lighter than pre-embolization.   Had not had follow up since the procedure in April. Sees little brown bits in the toilet from time to time. Bleeding remains irregular but is much lighter since the . Would like to have her iron checked as well. No more than her usual menstrual pain whei her cramping.   Will have intermittent pain that will "grip" her and then it will go. No issues with BM or voiding. Feels she can void without issue and have BM. Not currently sexually active.    ROS     Gynecologic History Patient's last menstrual period was 05/31/2022. Contraception: abstinence Last Pap: 06/2020. Result was normal with negative HPV Last Mammogram: 04/2022.  Result was incompliete Last Colonoscopy: 10/2017.  Result was normal  Obstetric History OB History  Gravida Para Term Preterm AB Living  2         2  SAB IAB Ectopic Multiple Live Births               # Outcome Date GA Lbr Len/2nd Weight Sex Delivery Anes PTL Lv  2 Gravida           1 Gravida             Past Medical History:  Diagnosis Date   Abnormal screening mammogram 07/01/2013   01/2013   Abnormal vaginal bleeding 01/17/2022   Anemia    Anemia, iron deficiency 11/18/2006   Qualifier: History of  By: Walker Kehr MD, [place]     Arthritis    Breast pain, left 11/28/2014   Carpal tunnel syndrome 07/20/2014   Cervical cancer screening 06/14/2020   CERVICAL RADICULOPATHY, RIGHT 04/26/2008   Qualifier: Diagnosis of  By: Walker Kehr MD,  [place] 12/19/2021   Chest tightness 02/11/2012   At rest   Chronic right hip pain 01/11/2016   Colon polyps    Constipation 11/02/2014   Fatigue 06/02/2015   Fibroid uterus 04/26/2021   Generalized joint pain 01/27/2007   Acetaminophen for daily pain, Short course of Ibuprofen for more severe flare.     History of colonic polyps 06/07/2016   Colonoscopy 2010 Hepatic flexure sessile polyp measuring 1.0 cm Transverse colon sessile polyp measuring 4 mm   History of ETT 08/2006   no sustained tachycardia   Hypertension    Left arm pain 11/28/2014   Left knee pain 01/10/2017   Left shoulder pain 01/11/2016   LVH (left ventricular hypertrophy) 06/07/2016   EKG 06/07/16   Memory change 09/05/2021   Menorrhagia 04/21/2012   Morbid obesity ([place]) 10/03/2014   2015  BMI 36 2017  BMI 37  05/2016 ASCVD 2.4%   Neuropathy 11/25/2017   OSA (obstructive sleep apnea) 03/20/2016   Sleep study 07/2015 showed mild OSA. No CPAP recommended.    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   Unspecified vitamin D deficiency 12/09/2012   Level 23 on 12/08/12 and Vitamin D3 1000-2000 IU daily was recommended   Uterine leiomyoma 11/22/2021    Past Surgical History:  Procedure Laterality Date   CESAREAN SECTION  1990   CESAREAN SECTION  1991   IR ANGIOGRAM PELVIS SELECTIVE OR SUPRASELECTIVE  11/22/2021   IR ANGIOGRAM SELECTIVE EACH ADDITIONAL VESSEL  11/22/2021   IR ANGIOGRAM SELECTIVE EACH ADDITIONAL VESSEL  11/22/2021   IR EMBO TUMOR ORGAN ISCHEMIA INFARCT INC GUIDE ROADMAPPING  11/22/2021   IR RADIOLOGIST EVAL & MGMT  08/14/2021   IR RADIOLOGIST EVAL & MGMT  12/20/2021   IR US GUIDE VASC ACCESS LEFT  11/22/2021   RADIOLOGY WITH ANESTHESIA N/A 01/29/2022   Procedure: CERVICAL SPINE WITHOUT CONTRAST,BRAIN WITH AND  WITHOUT CONTRAST;  Surgeon: Radiologist, Medication, MD;  Location: [facility];  Service: Radiology;  Laterality: N/A;   SALPINGECTOMY Right     Current Outpatient Medications on File Prior to Visit  Medication Sig Dispense Refill   acetaminophen (TYLENOL) 650 MG CR tablet Take 2 tablets (1,300 mg total) by mouth every 8 (eight) hours as needed for pain. take 2 tabs every 6 hours as needed for pain (Patient taking differently: Take 1,300 mg by mouth every 8 (eight) hours as needed for pain.) 30 tablet prn   cetirizine (ZYRTEC ALLERGY) 10 MG tablet Take 1 tablet (10 mg total) by mouth daily. 90 tablet 0   diclofenac Sodium (VOLTAREN) 1 % GEL Apply 2 g topically daily as needed (For shoulder pain).     Enalapril-hydroCHLOROthiazide 5-12.5 MG tablet TAKE 1 TABLET BY MOUTH DAILY 90 tablet 0   ferrous sulfate 325 (65 FE) MG EC tablet Take 325 mg by mouth 3 (three) times daily with meals.     fluticasone (FLONASE) 50 MCG/ACT nasal spray Place 2 sprays into both nostrils daily. 16 g 6   ibuprofen (ADVIL) 200 MG tablet Take 600 mg by mouth every 6 (six) hours as needed for mild pain or moderate pain.     metoprolol succinate (TOPROL-XL) 50 MG 24 hr tablet Take 1 tablet (50 mg total) by mouth every evening. 90 tablet 1   No current facility-administered medications on file prior to visit.    No Known Allergies  Social History:  reports that she quit smoking about 28 years ago. Her smoking use included cigarettes. She has never used smokeless tobacco. She reports that she does not drink alcohol and does not use drugs.  Family History  Problem Relation Age of Onset   Stroke Sister        drug abuse   Diabetes Brother    Kidney failure Brother    Diabetes Mother    Hypertension Mother    Heart failure Mother    Kidney disease Mother    Lung cancer Maternal Grandfather        smoker    The following portions of the patient's history were reviewed and updated as appropriate: allergies, current  medications, past family history, past medical history, past social history, past surgical history and problem list.  Review of Systems Pertinent items noted in HPI and remainder of comprehensive ROS otherwise negative.  Physical Exam:  LMP 05/31/2022  Physical Exam Vitals and nursing note reviewed.  Constitutional:      Appearance: Normal appearance.  Pulmonary:     Effort: Pulmonary effort is normal.  Abdominal:  Palpations: Abdomen is soft.  Neurological:     General: No focal deficit present.     Mental Status: She is alert and oriented to person, place, and time.  Psychiatric:        Mood and Affect: Mood normal.        Behavior: Behavior normal.        Thought Content: Thought content normal.        Judgment: Judgment normal.     Assessment and Plan:   1. Menorrhagia with regular cycle Flow has improved, persistent irregularity present. Re-check CBC as was quite anemic prior to procedure. If remains anemic, may benefit from repeat IV iron as well as hormonal suppression until menopause reached. She is ok with current cycle as long as it remains lighter than previous. Discussed discharge likely secondary to shrinking volume secondary to UAE/UFE.  - US PELVIC COMPLETE WITH TRANSVAGINAL; Future - CBC  2. Fibroids Ultrasound to check interval change since procedure. Decreased pelvic pressure, reassuring of likely shrinking fibroid/uterine volume.  - US PELVIC COMPLETE WITH TRANSVAGINAL; Future  Routine preventative health maintenance measures emphasized. Please refer to After Visit Summary for other counseling recommendations.      Darliss Cheney, MD Obstetrician & Gynecologist, [facility] [employer], [place]

## 2022-06-21 LAB — CBC
Hematocrit: 27.6 % — ABNORMAL LOW (ref 34.0–46.6)
Hemoglobin: 7.5 g/dL — ABNORMAL LOW (ref 11.1–15.9)
MCH: 17.5 pg — ABNORMAL LOW (ref 26.6–33.0)
MCHC: 27.2 g/dL — ABNORMAL LOW (ref 31.5–35.7)
MCV: 64 fL — ABNORMAL LOW (ref 79–97)
Platelets: 406 10*3/uL (ref 150–450)
RBC: 4.29 x10E6/uL (ref 3.77–5.28)
RDW: 18.3 % — ABNORMAL HIGH (ref 11.7–15.4)
WBC: 6 10*3/uL (ref 3.4–10.8)

## 2022-06-25 ENCOUNTER — Other Ambulatory Visit: Payer: Self-pay | Admitting: Obstetrics and Gynecology

## 2022-06-27 ENCOUNTER — Telehealth: Payer: Self-pay

## 2022-06-27 ENCOUNTER — Other Ambulatory Visit: Payer: Self-pay | Admitting: Obstetrics and Gynecology

## 2022-06-27 ENCOUNTER — Telehealth: Payer: Self-pay | Admitting: Pharmacy Technician

## 2022-06-27 DIAGNOSIS — N920 Excessive and frequent menstruation with regular cycle: Secondary | ICD-10-CM

## 2022-06-27 MED ORDER — SODIUM CHLORIDE 0.9 % IV SOLN: 300.0000 mg | INTRAVENOUS | Status: DC

## 2022-06-27 NOTE — Telephone Encounter (Signed)
Opened in error

## 2022-06-27 NOTE — Telephone Encounter (Signed)
Auth Submission: NO AUTH NEEDED Payer: [employer]BS Medication & CPT/J Code(s) submitted: Venofer (Iron Sucrose) J1756 Route of submission (phone, fax, portal):  Phone # Fax # Auth type: Buy/Bill Units/visits requested: X5 Reference number:  Approval from: 06/27/22 to 08/11/22

## 2022-06-27 NOTE — Addendum Note (Signed)
Addended by: Cindi Carbon on: 06/27/2022 05:15 PM   Modules accepted: Orders

## 2022-06-28 ENCOUNTER — Telehealth: Payer: Self-pay

## 2022-06-28 NOTE — Telephone Encounter (Signed)
Opened in error

## 2022-07-01 ENCOUNTER — Ambulatory Visit: Admission: RE | Admit: 2022-07-01 | Payer: BC Managed Care – PPO | Source: Ambulatory Visit

## 2022-07-03 ENCOUNTER — Ambulatory Visit
Admission: RE | Admit: 2022-07-03 | Discharge: 2022-07-03 | Disposition: A | Discharge status: Home / Self Care | Payer: BC Managed Care – PPO | Source: Ambulatory Visit | Attending: Obstetrics and Gynecology | Admitting: Obstetrics and Gynecology

## 2022-07-03 DIAGNOSIS — N920 Excessive and frequent menstruation with regular cycle: Secondary | ICD-10-CM | POA: Diagnosis not present

## 2022-07-03 DIAGNOSIS — D219 Benign neoplasm of connective and other soft tissue, unspecified: Secondary | ICD-10-CM | POA: Insufficient documentation

## 2022-07-03 DIAGNOSIS — D259 Leiomyoma of uterus, unspecified: Secondary | ICD-10-CM | POA: Diagnosis not present

## 2022-07-03 DIAGNOSIS — N852 Hypertrophy of uterus: Secondary | ICD-10-CM | POA: Diagnosis not present

## 2022-07-08 ENCOUNTER — Ambulatory Visit (INDEPENDENT_AMBULATORY_CARE_PROVIDER_SITE_OTHER): Payer: BC Managed Care – PPO

## 2022-07-08 VITALS — BP 115/64 | HR 47 | Temp 98.6°F | Resp 18 | Ht 61.0 in | Wt 194.6 lb

## 2022-07-08 DIAGNOSIS — D508 Other iron deficiency anemias: Secondary | ICD-10-CM | POA: Diagnosis not present

## 2022-07-08 MED ORDER — ACETAMINOPHEN 325 MG PO TABS
650.0000 mg | ORAL_TABLET | Freq: Once | ORAL | Status: AC
  Administered 2022-07-08: 650 mg via ORAL
  Filled 2022-07-08: qty 2

## 2022-07-08 MED ORDER — DIPHENHYDRAMINE HCL 25 MG PO CAPS
25.0000 mg | ORAL_CAPSULE | Freq: Once | ORAL | Status: AC
  Administered 2022-07-08: 25 mg via ORAL
  Filled 2022-07-08: qty 1

## 2022-07-08 MED ORDER — SODIUM CHLORIDE 0.9 % IV SOLN
200.0000 mg | Freq: Once | INTRAVENOUS | Status: AC
  Administered 2022-07-08: 200 mg via INTRAVENOUS
  Filled 2022-07-08: qty 10

## 2022-07-08 NOTE — Progress Notes (Signed)
Diagnosis: Iron Deficiency Anemia  Provider:  Marshell Garfinkel MD  Procedure: Infusion  IV Type: Peripheral, IV Location: L Hand  Venofer (Iron Sucrose), Dose: 200 mg  Infusion Start Time: 2549  Infusion Stop Time: 8264  Post Infusion IV Care: Observation period completed and Peripheral IV Discontinued  Discharge: Condition: Good, Destination: Home . AVS provided to patient.   Performed by:  Arnoldo Morale, RN

## 2022-07-10 ENCOUNTER — Ambulatory Visit (INDEPENDENT_AMBULATORY_CARE_PROVIDER_SITE_OTHER): Payer: BC Managed Care – PPO | Admitting: *Deleted

## 2022-07-10 VITALS — BP 101/61 | HR 60 | Temp 98.6°F | Resp 16 | Ht 61.0 in | Wt 194.4 lb

## 2022-07-10 DIAGNOSIS — D508 Other iron deficiency anemias: Secondary | ICD-10-CM | POA: Diagnosis not present

## 2022-07-10 MED ORDER — ACETAMINOPHEN 325 MG PO TABS: 650.0000 mg | ORAL_TABLET | Freq: Once | ORAL | Status: DC

## 2022-07-10 MED ORDER — DIPHENHYDRAMINE HCL 25 MG PO CAPS: 25.0000 mg | ORAL_CAPSULE | Freq: Once | ORAL | Status: DC

## 2022-07-10 MED ORDER — SODIUM CHLORIDE 0.9 % IV SOLN
200.0000 mg | Freq: Once | INTRAVENOUS | Status: AC
  Administered 2022-07-10: 200 mg via INTRAVENOUS
  Filled 2022-07-10: qty 10

## 2022-07-10 NOTE — Progress Notes (Signed)
Diagnosis: Iron Deficiency Anemia  Provider:  Marshell Garfinkel MD  Procedure: Infusion  IV Type: Peripheral, IV Location: R Hand  Venofer (Iron Sucrose), Dose: 200 mg  Infusion Start Time: 0097 pm  Infusion Stop Time: 9499 pm  Post Infusion IV Care: Observation period completed and Peripheral IV Discontinued  Discharge: Condition: Good, Destination: Home . AVS provided to patient.   Performed by:  Oren Beckmann, RN

## 2022-07-12 ENCOUNTER — Ambulatory Visit (INDEPENDENT_AMBULATORY_CARE_PROVIDER_SITE_OTHER): Payer: BC Managed Care – PPO

## 2022-07-12 VITALS — BP 119/74 | HR 52 | Temp 98.4°F | Resp 16 | Ht 61.0 in | Wt 193.0 lb

## 2022-07-12 DIAGNOSIS — D508 Other iron deficiency anemias: Secondary | ICD-10-CM | POA: Diagnosis not present

## 2022-07-12 MED ORDER — SODIUM CHLORIDE 0.9 % IV SOLN
200.0000 mg | Freq: Once | INTRAVENOUS | Status: AC
  Administered 2022-07-12: 200 mg via INTRAVENOUS
  Filled 2022-07-12: qty 10

## 2022-07-12 MED ORDER — ACETAMINOPHEN 325 MG PO TABS: 650.0000 mg | ORAL_TABLET | Freq: Once | ORAL | Status: DC

## 2022-07-12 MED ORDER — DIPHENHYDRAMINE HCL 25 MG PO CAPS: 25.0000 mg | ORAL_CAPSULE | Freq: Once | ORAL | Status: DC

## 2022-07-12 NOTE — Progress Notes (Signed)
Diagnosis: Iron Deficiency Anemia  Provider:  Marshell Garfinkel MD  Procedure: Infusion  IV Type: Peripheral, IV Location: R Hand  Venofer (Iron Sucrose), Dose: 200 mg  Infusion Start Time: 9906  Infusion Stop Time: 8934  Post Infusion IV Care: Peripheral IV Discontinued  Discharge: Condition: Good, Destination: Home . AVS provided to patient.   Performed by:  Adelina Mings, LPN

## 2022-07-15 ENCOUNTER — Ambulatory Visit: Payer: BC Managed Care – PPO

## 2022-07-15 MED ORDER — ACETAMINOPHEN 325 MG PO TABS: 650.0000 mg | ORAL_TABLET | Freq: Once | ORAL | Status: AC

## 2022-07-15 MED ORDER — DIPHENHYDRAMINE HCL 25 MG PO CAPS: 25.0000 mg | ORAL_CAPSULE | Freq: Once | ORAL | Status: AC

## 2022-07-15 MED ORDER — SODIUM CHLORIDE 0.9 % IV SOLN
200.0000 mg | Freq: Once | INTRAVENOUS | Status: AC
  Filled 2022-07-15: qty 10

## 2022-07-16 ENCOUNTER — Other Ambulatory Visit: Payer: Self-pay

## 2022-07-17 ENCOUNTER — Ambulatory Visit (INDEPENDENT_AMBULATORY_CARE_PROVIDER_SITE_OTHER): Payer: BC Managed Care – PPO

## 2022-07-17 VITALS — BP 124/69 | HR 59 | Temp 98.1°F | Resp 18 | Ht 61.0 in | Wt 193.6 lb

## 2022-07-17 DIAGNOSIS — D508 Other iron deficiency anemias: Secondary | ICD-10-CM

## 2022-07-17 MED ORDER — ACETAMINOPHEN 325 MG PO TABS: 650.0000 mg | ORAL_TABLET | Freq: Once | ORAL | Status: DC

## 2022-07-17 MED ORDER — SODIUM CHLORIDE 0.9 % IV SOLN
200.0000 mg | Freq: Once | INTRAVENOUS | Status: AC
  Administered 2022-07-17: 200 mg via INTRAVENOUS
  Filled 2022-07-17: qty 10

## 2022-07-17 MED ORDER — DIPHENHYDRAMINE HCL 25 MG PO CAPS: 25.0000 mg | ORAL_CAPSULE | Freq: Once | ORAL | Status: DC

## 2022-07-17 NOTE — Progress Notes (Signed)
Diagnosis: Iron Deficiency Anemia  Provider:  Marshell Garfinkel MD  Procedure: Infusion  IV Type: Peripheral, IV Location: R Hand  Venofer (Iron Sucrose), Dose: 200 mg  Infusion Start Time: 4175  Infusion Stop Time: 1600  Post Infusion IV Care: Peripheral IV Discontinued  Discharge: Condition: Good, Destination: Home . AVS provided to patient.   Performed by:  Cleophus Molt, RN

## 2022-07-19 ENCOUNTER — Ambulatory Visit (INDEPENDENT_AMBULATORY_CARE_PROVIDER_SITE_OTHER): Payer: BC Managed Care – PPO | Admitting: *Deleted

## 2022-07-19 VITALS — BP 106/64 | HR 79 | Temp 98.5°F | Resp 18 | Ht 61.0 in | Wt 192.4 lb

## 2022-07-19 DIAGNOSIS — N939 Abnormal uterine and vaginal bleeding, unspecified: Secondary | ICD-10-CM

## 2022-07-19 DIAGNOSIS — D500 Iron deficiency anemia secondary to blood loss (chronic): Secondary | ICD-10-CM

## 2022-07-19 DIAGNOSIS — D508 Other iron deficiency anemias: Secondary | ICD-10-CM

## 2022-07-19 MED ORDER — DIPHENHYDRAMINE HCL 25 MG PO CAPS: 25.0000 mg | ORAL_CAPSULE | Freq: Once | ORAL | Status: DC

## 2022-07-19 MED ORDER — SODIUM CHLORIDE 0.9 % IV SOLN
200.0000 mg | Freq: Once | INTRAVENOUS | Status: AC
  Administered 2022-07-19: 200 mg via INTRAVENOUS
  Filled 2022-07-19: qty 10

## 2022-07-19 MED ORDER — ACETAMINOPHEN 325 MG PO TABS: 650.0000 mg | ORAL_TABLET | Freq: Once | ORAL | Status: DC

## 2022-07-19 NOTE — Progress Notes (Addendum)
Diagnosis: Iron Deficiency Anemia  Provider:  Marshell Garfinkel MD  Procedure: Infusion  IV Type: Peripheral, IV Location: R Hand  Venofer (Iron Sucrose), Dose: 200 mg  Infusion Start Time: 2725  Infusion Stop Time: 3664  Post Infusion IV Care: Patient declined observation and Peripheral IV Discontinued  Discharge: Condition: Good, Destination: Home . AVS provided to patient.   Performed by:  Baxter Hire, RN

## 2022-07-24 ENCOUNTER — Encounter: Payer: Self-pay | Admitting: Cardiology

## 2022-07-24 ENCOUNTER — Ambulatory Visit: Payer: BC Managed Care – PPO | Attending: Cardiology | Admitting: Cardiology

## 2022-07-24 VITALS — BP 130/60 | HR 71 | Ht 61.0 in | Wt 193.1 lb

## 2022-07-24 DIAGNOSIS — I10 Essential (primary) hypertension: Secondary | ICD-10-CM

## 2022-07-24 DIAGNOSIS — R011 Cardiac murmur, unspecified: Secondary | ICD-10-CM

## 2022-07-24 DIAGNOSIS — Z13220 Encounter for screening for lipoid disorders: Secondary | ICD-10-CM

## 2022-07-24 DIAGNOSIS — G4733 Obstructive sleep apnea (adult) (pediatric): Secondary | ICD-10-CM

## 2022-07-24 DIAGNOSIS — R7303 Prediabetes: Secondary | ICD-10-CM

## 2022-07-24 NOTE — Patient Instructions (Signed)
Medication Instructions:  Your physician recommends that you continue on your current medications as directed. Please refer to the Current Medication list given to you today.  *If you need a refill on your cardiac medications before your next appointment, please call your pharmacy*   Lab Work: Your physician recommends that you return for lab work in: the next few days You need to have labs done when you are fasting.  You can come Monday through Friday 8:30 am to 12:00 pm and 1:15 to 4:30. You do not need to make an appointment as the order has already been placed. The labs you are going to have done are LFT and Lipids.  [address] in [place]. They also close daily for lunch for 12-1.  or [address] 2nd floor M-W 8-11:30 and 1-4:30 and Thursday and Friday 8-11:30.  If you have labs (blood work) drawn today and your tests are completely normal, you will receive your results only by: [place] (if you have MyChart) OR A paper copy in the mail If you have any lab test that is abnormal or we need to change your treatment, we will call you to review the results.   Testing/Procedures: Your physician has requested that you have an echocardiogram. Echocardiography is a painless test that uses sound waves to create images of your heart. It provides your doctor with information about the size and shape of your heart and how well your heart's chambers and valves are working. This procedure takes approximately one hour. There are no restrictions for this procedure.  We will order CT coronary calcium score. It will cost $99.00 and is not covered by insurance.  Please call to schedule.    [facility] [address] [place], [place] [address] [phone]   Follow-Up: At [facility], you and your health needs are our priority.  As part of our continuing mission to provide you with exceptional heart care, we have created designated  Provider Care Teams.  These Care Teams include your primary Cardiologist (physician) and Advanced Practice Providers (APPs -  Physician Assistants and Nurse Practitioners) who all work together to provide you with the care you need, when you need it.  We recommend signing up for the patient portal called "MyChart".  Sign up information is provided on this After Visit Summary.  MyChart is used to connect with patients for Virtual Visits (Telemedicine).  Patients are able to view lab/test results, encounter notes, upcoming appointments, etc.  Non-urgent messages can be sent to your provider as well.   To learn more about what you can do with MyChart, go to [email or web address].    Your next appointment:   12 month(s)  The format for your next appointment:   In Person  Provider:   Jyl Heinz, MD   Other Instructions Echocardiogram An echocardiogram is a test that uses sound waves (ultrasound) to produce images of the heart. Images from an echocardiogram can provide important information about: Heart size and shape. The size and thickness and movement of your heart's walls. Heart muscle function and strength. Heart valve function or if you have stenosis. Stenosis is when the heart valves are too narrow. If blood is flowing backward through the heart valves (regurgitation). A tumor or infectious growth around the heart valves. Areas of heart muscle that are not working well because of poor blood flow or injury from a heart attack. Aneurysm detection. An aneurysm is a weak or damaged  part of an artery wall. The wall bulges out from the normal force of blood pumping through the body. Tell a health care provider about: Any allergies you have. All medicines you are taking, including vitamins, herbs, eye drops, creams, and over-the-counter medicines. Any blood disorders you have. Any surgeries you have had. Any medical conditions you have. Whether you are pregnant or may be  pregnant. What are the risks? Generally, this is a safe test. However, problems may occur, including an allergic reaction to dye (contrast) that may be used during the test. What happens before the test? No specific preparation is needed. You may eat and drink normally. What happens during the test? You will take off your clothes from the waist up and put on a hospital gown. Electrodes or electrocardiogram (ECG)patches may be placed on your chest. The electrodes or patches are then connected to a device that monitors your heart rate and rhythm. You will lie down on a table for an ultrasound exam. A gel will be applied to your chest to help sound waves pass through your skin. A handheld device, called a transducer, will be pressed against your chest and moved over your heart. The transducer produces sound waves that travel to your heart and bounce back (or "echo" back) to the transducer. These sound waves will be captured in real-time and changed into images of your heart that can be viewed on a video monitor. The images will be recorded on a computer and reviewed by your health care provider. You may be asked to change positions or hold your breath for a short time. This makes it easier to get different views or better views of your heart. In some cases, you may receive contrast through an IV in one of your veins. This can improve the quality of the pictures from your heart. The procedure may vary among health care providers and hospitals.   What can I expect after the test? You may return to your normal, everyday life, including diet, activities, and medicines, unless your health care provider tells you not to do that. Follow these instructions at home: It is up to you to get the results of your test. Ask your health care provider, or the department that is doing the test, when your results will be ready. Keep all follow-up visits. This is important. Summary An echocardiogram is a test that uses  sound waves (ultrasound) to produce images of the heart. Images from an echocardiogram can provide important information about the size and shape of your heart, heart muscle function, heart valve function, and other possible heart problems. You do not need to do anything to prepare before this test. You may eat and drink normally. After the echocardiogram is completed, you may return to your normal, everyday life, unless your health care provider tells you not to do that. This information is not intended to replace advice given to you by your health care provider. Make sure you discuss any questions you have with your health care provider. Document Revised: 03/21/2020 Document Reviewed: 03/21/2020 Elsevier Patient Education  2021 [place].  Coronary Calcium Scan A coronary calcium scan is an imaging test used to look for deposits of plaque in the inner lining of the blood vessels of the heart (coronary arteries). Plaque is made up of calcium, protein, and fatty substances. These deposits of plaque can partly clog and narrow the coronary arteries without producing any symptoms or warning signs. This puts a person at risk for a heart  attack. A coronary calcium scan is performed using a computed tomography (CT) scanner machine without using a dye (contrast). This test is recommended for people who are at moderate risk for heart disease. The test can find plaque deposits before symptoms develop. Tell a health care provider about: Any allergies you have. All medicines you are taking, including vitamins, herbs, eye drops, creams, and over-the-counter medicines. Any problems you or family members have had with anesthetic medicines. Any bleeding problems you have. Any surgeries you have had. Any medical conditions you have. Whether you are pregnant or may be pregnant. What are the risks? Generally, this is a safe procedure. However, problems may occur, including: Harm to a pregnant woman and her  unborn baby. This test involves the use of radiation. Radiation exposure can be dangerous to a pregnant woman and her unborn baby. If you are pregnant or think you may be pregnant, you should not have this procedure done. A slight increase in the risk of cancer. This is because of the radiation involved in the test. The amount of radiation from one test is similar to the amount of radiation you are naturally exposed to over one year. What happens before the procedure? Ask your health care provider for any specific instructions on how to prepare for this procedure. You may be asked to avoid products that contain caffeine, tobacco, or nicotine for 4 hours before the procedure. What happens during the procedure?  You will undress and remove any jewelry from your neck or chest. You may need to remove hearing aides and dentures. Women may need to remove their bras. You will put on a hospital gown. Sticky electrodes will be placed on your chest. The electrodes will be connected to an electrocardiogram (ECG) machine to record a tracing of the electrical activity of your heart. You will lie down on your back on a curved bed that is attached to the [place]. You may be given medicine to slow down your heart rate so that clear pictures can be created. You will be moved into the CT scanner, and the CT scanner will take pictures of your heart. During this time, you will be asked to lie still and hold your breath for 10-20 seconds at a time while each picture of your heart is being taken. The procedure may vary among health care providers and hospitals. What can I expect after the procedure? You can return to your normal activities. It is up to you to get the results of your procedure. Ask your health care provider, or the department that is doing the procedure, when your results will be ready. Summary A coronary calcium scan is an imaging test used to look for deposits of plaque in the inner lining of the  blood vessels of the heart. Plaque is made up of calcium, protein, and fatty substances. A coronary calcium scan is performed using a CT scanner machine without contrast. Generally, this is a safe procedure. Tell your health care provider if you are pregnant or may be pregnant. Ask your health care provider for any specific instructions on how to prepare for this procedure. You can return to your normal activities after the scan is done. This information is not intended to replace advice given to you by your health care provider. Make sure you discuss any questions you have with your health care provider. Document Revised: 07/08/2021 Document Reviewed: 07/08/2021 Elsevier Patient Education  [place].

## 2022-07-24 NOTE — Progress Notes (Signed)
Cardiology Office Note:    Date:  07/24/2022   ID:  Adrienne Collins, DOB 12/22/1970, MRN [id number]  PCP:  Leeanne Rio, Collins  Cardiologist:  Jenean Lindau, Collins   Referring Collins: Leeanne Rio, Collins    ASSESSMENT:    1. Primary hypertension   2. OSA (obstructive sleep apnea)   3. Morbid obesity (Adrienne Collins)   4. Cardiac murmur    PLAN:    In order of problems listed above:  Primary prevention stressed with the patient.  Importance of compliance with diet medication stressed and she vocalized understanding.  She was advised to at least walk half an hour a day 5 days a week and she promises to do so. Essential hypertension: Blood pressure stable and diet was emphasized.  Lifestyle modification urged. Obesity: Weight reduction stressed risks of obesity explained and she promises to do better. Cardiac murmur: Echo cardiogram will be done to assess murmur heard on auscultation. Obesity: Weight reduction stressed risks of obesity explained and diet emphasized.  She promises to do better. Sleep apnea: Sleep issues were discussed. Coronary risk stratification: I discussed coronary calcium score and she is agreeable.  I also will get liver lipid check on her for the same reason and she is agreeable. Patient will be seen in follow-up appointment in 12 months or earlier if the patient has any concerns    Medication Adjustments/Labs and Tests Ordered: Current medicines are reviewed at length with the patient today.  Concerns regarding medicines are outlined above.  No orders of the defined types were placed in this encounter.  No orders of the defined types were placed in this encounter.    History of Present Illness:    Adrienne Collins is a 51 y.o. female who is being seen today for the evaluation of cardiovascular status at the request of Ardelia Mems, Adrienne Limber, Collins. patient is a pleasant 51 year old female.  She has past medical history of essential hypertension.  She mentions to  me that she overall leads a sedentary lifestyle and is here to get evaluated.  She denies any chest pain orthopnea or PND.  She is here for restratification.  She is overweight.  At the time of my evaluation, the patient is alert awake oriented and in no distress.  Past Medical History:  Diagnosis Date   Abnormal screening mammogram 07/01/2013   01/2013   Abnormal vaginal bleeding 01/17/2022   Anemia    Anemia, iron deficiency 11/18/2006   Qualifier: History of  By: Adrienne Collins, [place]     Arthritis    Breast pain, left 11/28/2014   Carpal tunnel syndrome 07/20/2014   Cervical cancer screening 06/14/2020   CERVICAL RADICULOPATHY, RIGHT 04/26/2008   Qualifier: Diagnosis of  By: Adrienne Collins, [place] 12/19/2021   Chest tightness 02/11/2012   At rest   Chronic right hip pain 01/11/2016   Colon polyps    Constipation 11/02/2014   Fatigue 06/02/2015   Fibroid uterus 04/26/2021   Generalized joint pain 01/27/2007   Acetaminophen for daily pain, Short course of Ibuprofen for more severe flare.     History of colonic polyps 06/07/2016   Colonoscopy 2010 Hepatic flexure sessile polyp measuring 1.0 cm Transverse colon sessile polyp measuring 4 mm   History of ETT 08/2006   no sustained tachycardia   Hypertension    Left arm pain 11/28/2014   Left knee pain 01/10/2017   Left shoulder pain 01/11/2016   LVH (left ventricular hypertrophy)  06/07/2016   EKG 06/07/16   Memory change 09/05/2021   Menorrhagia 04/21/2012   Morbid obesity ([place]) 10/03/2014   2015  BMI 36 2017  BMI 37  05/2016 ASCVD 2.4%   Neuropathy 11/25/2017   OSA (obstructive sleep apnea) 03/20/2016   Sleep study 07/2015 showed mild OSA. No CPAP recommended.    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   Unspecified vitamin D deficiency 12/09/2012    Level 23 on 12/08/12 and Vitamin D3 1000-2000 IU daily was recommended   Uterine leiomyoma 11/22/2021    Past Surgical History:  Procedure Laterality Date   CESAREAN SECTION  1990   CESAREAN SECTION  1991   IR ANGIOGRAM PELVIS SELECTIVE OR SUPRASELECTIVE  11/22/2021   IR ANGIOGRAM SELECTIVE EACH ADDITIONAL VESSEL  11/22/2021   IR ANGIOGRAM SELECTIVE EACH ADDITIONAL VESSEL  11/22/2021   IR EMBO TUMOR ORGAN ISCHEMIA INFARCT INC GUIDE ROADMAPPING  11/22/2021   IR RADIOLOGIST EVAL & MGMT  08/14/2021   IR RADIOLOGIST EVAL & MGMT  12/20/2021   IR US GUIDE VASC ACCESS LEFT  11/22/2021   RADIOLOGY WITH ANESTHESIA N/A 01/29/2022   Procedure: CERVICAL SPINE WITHOUT CONTRAST,BRAIN WITH AND WITHOUT CONTRAST;  Surgeon: Radiologist, Medication, Collins;  Location: [facility];  Service: Radiology;  Laterality: N/A;   SALPINGECTOMY Right     Current Medications: Current Meds  Medication Sig   acetaminophen (TYLENOL) 650 MG CR tablet Take 2 tablets (1,300 mg total) by mouth every 8 (eight) hours as needed for pain. take 2 tabs every 6 hours as needed for pain   Biotin 1000 MCG CHEW Chew 1,000 mg by mouth daily.   cetirizine (ZYRTEC ALLERGY) 10 MG tablet Take 1 tablet (10 mg total) by mouth daily.   clobetasol (TEMOVATE) 0.05 % external solution Apply 1 Application topically daily as needed.   desonide (DESOWEN) 0.05 % cream Apply 1 Application topically 2 (two) times daily.   diclofenac Sodium (VOLTAREN) 1 % GEL Apply 2 g topically daily as needed (For shoulder pain).   Enalapril-hydroCHLOROthiazide 5-12.5 MG tablet TAKE 1 TABLET BY MOUTH DAILY   ferrous sulfate 325 (65 FE) MG EC tablet Take 325 mg by mouth 3 (three) times daily with meals.   fluticasone (FLONASE) 50 MCG/ACT nasal spray Place 2 sprays into both nostrils daily.   ibuprofen (ADVIL) 200 MG tablet Take 600 mg by mouth every 6 (six) hours as needed for mild pain or moderate pain.   metoprolol succinate (TOPROL-XL) 50 MG 24 hr tablet Take 1 tablet (50 mg  total) by mouth every evening.     Allergies:   Patient has no known allergies.   Social History   Socioeconomic History   Marital status: Single    Spouse name: Not on file   Number of children: 2   Years of education: 45 GED   Highest education level: Not on file  Occupational History   Occupation: Activity assistant    Employer: [employer]  Tobacco Use   Smoking status: Former    Types: Cigarettes    Quit date: 01/30/1994    Years since quitting: 28.4   Smokeless tobacco: Never  Substance and Sexual Activity   Alcohol use: No    Comment: quit 1995   Drug use: No   Sexual activity: Not Currently  Other  Topics Concern   Not on file  Social History Narrative   Never married, celebate since 46Son, [place], odd jobs, living at [employer], Engineer, civil (consulting), Ship broker at [employer], living at [employer] [facility] since 01/2012, CMA and Med TechEmployed at [employer]   Right handed    Caffeine- 1-2 cups per day    Social Determinants of Health   Financial Resource Strain: Not on file  Food Insecurity: No Food Insecurity (04/26/2021)   Hunger Vital Sign    Worried About Running Out of Food in the Last Year: Never true    Ran Out of Food in the Last Year: Never true  Transportation Needs: No Transportation Needs (04/26/2021)   PRAPARE - Hydrologist (Medical): No    Lack of Transportation (Non-Medical): No  Physical Activity: Not on file  Stress: Not on file  Social Connections: Not on file     Family History: The patient's family history includes Diabetes in her brother and mother; Heart failure in her mother; Hypertension in her mother; Kidney disease in her mother; Kidney failure in her brother; Lung cancer in her maternal grandfather; Stroke in her sister.  ROS:   Please see the history of present illness.    All other systems reviewed and are negative.  EKGs/Labs/Other Studies Reviewed:    The following  studies were reviewed today: EKG reveals sinus rhythm and nonspecific ST-T changes   Recent Labs: 09/05/2021: TSH 0.955 01/29/2022: BUN 12; Creatinine, Ser 0.65; Potassium 3.9; Sodium 138 06/20/2022: Hemoglobin 7.5; Platelets 406  Recent Lipid Panel    Component Value Date/Time   CHOL 161 06/06/2020 0909   TRIG 79 06/06/2020 0909   HDL 44 06/06/2020 0909   CHOLHDL 3.7 06/06/2020 0909   CHOLHDL 3.9 06/07/2016 1708   VLDL 24 06/07/2016 1708   LDLCALC 102 (H) 06/06/2020 0909    Physical Exam:    VS:  BP 130/60   Pulse 71   Ht '5\' 1"'$  (1.549 m)   Wt 193 lb 1.9 oz (87.6 kg)   SpO2 96%   BMI 36.49 kg/m     Wt Readings from Last 3 Encounters:  07/24/22 193 lb 1.9 oz (87.6 kg)  07/19/22 192 lb 6.4 oz (87.3 kg)  07/17/22 193 lb 9.6 oz (87.8 kg)     GEN: Patient is in no acute distress HEENT: Normal NECK: No JVD; No carotid bruits LYMPHATICS: No lymphadenopathy CARDIAC: S1 S2 regular, 2/6 systolic murmur at the apex. RESPIRATORY:  Clear to auscultation without rales, wheezing or rhonchi  ABDOMEN: Soft, non-tender, non-distended MUSCULOSKELETAL:  No edema; No deformity  SKIN: Warm and dry NEUROLOGIC:  Alert and oriented x 3 PSYCHIATRIC:  Normal affect    Signed, Jenean Lindau, Collins  07/24/2022 4:06 PM    [facility]

## 2022-07-29 ENCOUNTER — Telehealth (HOSPITAL_BASED_OUTPATIENT_CLINIC_OR_DEPARTMENT_OTHER): Payer: Self-pay

## 2022-08-14 ENCOUNTER — Other Ambulatory Visit (HOSPITAL_COMMUNITY): Payer: BC Managed Care – PPO

## 2022-08-15 ENCOUNTER — Other Ambulatory Visit: Payer: Self-pay | Admitting: Family Medicine

## 2022-08-20 ENCOUNTER — Ambulatory Visit (HOSPITAL_COMMUNITY): Payer: BC Managed Care – PPO | Attending: Cardiology

## 2022-08-20 DIAGNOSIS — R011 Cardiac murmur, unspecified: Secondary | ICD-10-CM | POA: Diagnosis not present

## 2022-08-22 DIAGNOSIS — Z13220 Encounter for screening for lipoid disorders: Secondary | ICD-10-CM | POA: Diagnosis not present

## 2022-08-22 DIAGNOSIS — R7303 Prediabetes: Secondary | ICD-10-CM | POA: Diagnosis not present

## 2022-08-22 DIAGNOSIS — I10 Essential (primary) hypertension: Secondary | ICD-10-CM | POA: Diagnosis not present

## 2022-08-23 LAB — LIPID PANEL
Chol/HDL Ratio: 3.9 ratio (ref 0.0–4.4)
Cholesterol, Total: 192 mg/dL (ref 100–199)
HDL: 49 mg/dL (ref >39)
LDL Chol Calc (NIH): 127 mg/dL — ABNORMAL HIGH (ref 0–99)
Triglycerides: 86 mg/dL (ref 0–149)
VLDL Cholesterol Cal: 16 mg/dL (ref 5–40)

## 2022-08-23 LAB — HEPATIC FUNCTION PANEL
ALT: 32 IU/L (ref 0–32)
AST: 31 IU/L (ref 0–40)
Albumin: 4.8 g/dL (ref 3.8–4.9)
Alkaline Phosphatase: 83 IU/L (ref 44–121)
Bilirubin Total: 0.4 mg/dL (ref 0.0–1.2)
Bilirubin, Direct: 0.15 mg/dL (ref 0.00–0.40)
Total Protein: 7.7 g/dL (ref 6.0–8.5)

## 2022-08-28 LAB — ECHOCARDIOGRAM COMPLETE
Area-P 1/2: 3.75 cm2
S' Lateral: 2.9 cm

## 2022-09-12 ENCOUNTER — Ambulatory Visit: Payer: BC Managed Care – PPO | Admitting: Student

## 2022-09-12 VITALS — BP 124/68 | HR 68 | Wt 192.2 lb

## 2022-09-12 DIAGNOSIS — M1811 Unilateral primary osteoarthritis of first carpometacarpal joint, right hand: Secondary | ICD-10-CM | POA: Diagnosis not present

## 2022-09-12 MED ORDER — MELOXICAM 7.5 MG PO TABS: 7.5000 mg | ORAL_TABLET | Freq: Every day | ORAL | 0 refills | Status: DC

## 2022-09-12 NOTE — Progress Notes (Signed)
    SUBJECTIVE:   CHIEF COMPLAINT / HPI:   Patient is a 52 year old female presenting today for right thumb pain.  Pain started last Thursday and no trauma to the thumb. Pain is 9 out of 10 and described as soreness worse in the morning and feels like locking and popping. No swelling of the thumb but patient said she has had difficulty with opening a jar or door knobs because of the pain.  PERTINENT  PMH / PSH: Reviewed   OBJECTIVE:   BP 124/68   Pulse 68   Wt 192 lb 3.2 oz (87.2 kg)   SpO2 96%   BMI 36.32 kg/m    Physical Exam General: Alert, well appearing, NAD, Oriented x4 Cardiovascular: RRR, No Murmurs, Normal S2/S2 Respiratory: CTAB, No wheezing or Rales Right thumb: No notable deformity, mild swelling on the tenia surface with surrounding tenderness with palpation. Normal flexion and extension.   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 -Encourage splinting using bandage over the thumb for at least a month -Rx daily meloxicam for 7 days -Advised use of lidocaine patch over the affected area -Will consider Xray for worsening pain or no improvement -could need ultrasound guided steroid injection if no improvement or worsening symptoms.     Alen Bleacher, MD [place]

## 2022-09-12 NOTE — Patient Instructions (Addendum)
It was wonderful to meet you today. Thank you for allowing me to be a part of your care. Below is a short summary of what we discussed at your visit today:  Suspect your thumb pain is likely due to osteoarthritis.  I sent in prescription for meloxicam which you will take once daily for 7 days.  After that you can do Tylenol or ibuprofen.  However if you are doing ibuprofen please do not take it daily is a low risk of abdominal bleeding.  Also I recommend splinting the thumb.  As demonstrated for you.  Can also use over-the-counter lidocaine patch over the affected area.  Please follow-up with me in 3 to 4 weeks if no improvement or if symptoms get worse.  Please bring all of your medications to every appointment!  If you have any questions or concerns, please do not hesitate to contact us via phone or MyChart message.   Alen Bleacher, MD [facility]

## 2022-09-16 ENCOUNTER — Telehealth: Payer: Self-pay | Admitting: *Deleted

## 2022-09-16 NOTE — Telephone Encounter (Signed)
As patient is not yet due for screening mammogram (would be due in September/October 2024) she should first come in for an office visit to assess this issue. She needs a clinical exam.  Please contact patient to schedule appointment  Leeanne Rio, MD

## 2022-09-16 NOTE — Telephone Encounter (Signed)
Patient states that she needs a referral to [facility] for diagnostic mammogram due to bilateral breast pain x 4 days. Will forward to MD.  Johnney Ou

## 2022-09-18 NOTE — Telephone Encounter (Signed)
Called and scheduled patient to be seen.

## 2022-09-23 ENCOUNTER — Ambulatory Visit (INDEPENDENT_AMBULATORY_CARE_PROVIDER_SITE_OTHER): Payer: BC Managed Care – PPO | Admitting: Family Medicine

## 2022-09-23 ENCOUNTER — Encounter: Payer: Self-pay | Admitting: Family Medicine

## 2022-09-23 VITALS — BP 126/70 | HR 66 | Wt 186.0 lb

## 2022-09-23 DIAGNOSIS — M940 Chondrocostal junction syndrome [Tietze]: Secondary | ICD-10-CM

## 2022-09-23 NOTE — Progress Notes (Signed)
    SUBJECTIVE:   CHIEF COMPLAINT / HPI:   Breast pain Started about 9 days ago.  Pain came on suddenly without cause.  It is sometimes unilateral sometimes bilateral.  She usually feels it towards the top of her breast but feels as if it radiates down into the breast.  She usually has breast pain right before her menses, though this pain is different. She has not noticed any new lumps or bumps.  She has not had any trauma, though recounts a history of a DVT in her left breast years ago.  She has not had any drainage or bleeding from any area.  She has a fairly new bra that is similar to her previous.  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  PERTINENT  PMH / PSH: fhx of breast cancer, screening mammogram in 04/2022 was incomplete due to oval, focal asymmetry in the right breast that was indeterminate, though follow up US demonstrated benign 60m simple cyst in lower inner aspect with a depth of 856mfrom the nipple  OBJECTIVE:   BP 126/70   Pulse 66   Wt 186 lb (84.4 kg)   LMP 09/06/2022 (Approximate)   SpO2 98%   BMI 35.14 kg/m   General: Alert and oriented, in NAD Skin: Warm, dry, and intact without lesions HEENT: NCAT, EOM grossly normal, midline nasal septum Cardiac/Chest: RRR, no m/r/g appreciated; bilateral macromastia with increased tension near medial chest wall without erythema, rashes, or masses appreciated; TTP over pectorals and medial breast tissue bilaterally Respiratory: CTAB, breathing and speaking comfortably on RA Extremities: Moves all extremities grossly equally Neurological: No gross focal deficit Psychiatric: Appropriate mood and affect  ASSESSMENT/PLAN:   Costochondritis History and exam most consistent with musculoskeletal etiology versus breast pathology.  Reproducible pain on palpation bilaterally of breasts is consistent with costochondritis, possibly due to  increased stress of macromastia on musculature.  Of note, patient has lost around 20 pounds on a keto diet, could consider decreased muscle mass and increased strain causing symptoms now.  Will recommend trial of ibuprofen 600 mg every 6 hours for 5 days as well as wall push-ups to help stretch the pectorals.  Should her symptoms continue after 1 week, advised patient to follow-up for further evaluation.   BrEthelene HalMD [facility]

## 2022-09-23 NOTE — Patient Instructions (Signed)
It was great to see you today! Here's what we talked about:  I believe your pain is due to a muscular cause given it is worse around your pectoral muscles and my exam did not find any new bumps. I would like you to take 600 mg of ibuprofen every 6 hours for 5 days to see if we can reduce inflammation that may be in the muscles. You should also do three sets of 10 wall push ups every day to test those muscles and build their strength. If by next week you are not feeling any different, let us know for further evaluation.  Please let me know if you have any other questions.  Dr. Marcha Dutton

## 2022-10-06 ENCOUNTER — Other Ambulatory Visit: Payer: Self-pay | Admitting: Family Medicine

## 2022-10-06 DIAGNOSIS — Z6835 Body mass index (BMI) 35.0-35.9, adult: Secondary | ICD-10-CM | POA: Diagnosis not present

## 2022-10-06 DIAGNOSIS — J020 Streptococcal pharyngitis: Secondary | ICD-10-CM | POA: Diagnosis not present

## 2022-10-06 DIAGNOSIS — J029 Acute pharyngitis, unspecified: Secondary | ICD-10-CM | POA: Diagnosis not present

## 2022-10-07 ENCOUNTER — Ambulatory Visit: Payer: BC Managed Care – PPO | Admitting: Family Medicine

## 2022-10-07 VITALS — BP 116/70 | Ht 61.0 in | Wt 184.0 lb

## 2022-10-07 DIAGNOSIS — M6530 Trigger finger, unspecified finger: Secondary | ICD-10-CM

## 2022-10-07 MED ORDER — DICLOFENAC SODIUM 75 MG PO TBEC: 75.0000 mg | DELAYED_RELEASE_TABLET | Freq: Two times a day (BID) | ORAL | 1 refills | Status: DC

## 2022-10-07 NOTE — Patient Instructions (Signed)
You have a trigger finger. Stop the ibuprofen. Take diclofenac '75mg'$  twice a day with food for pain and inflammation - take for 7 days then as needed. Splint as directed definitely at night and as often as possible during the day. Follow up with me in 3-4 weeks if not improving and would go ahead with an injection.

## 2022-10-08 NOTE — Progress Notes (Signed)
PCP: Leeanne Rio, MD  Subjective:   HPI: Patient is a 52 y.o. female here for right thumb pain.  Patient reports about 3 weeks ago she started to get pain in her right thumb. Feels like pain is worsening. Associated with popping, feeling of the thumb getting stuck. Pain is achy. Tried ibuprofen, band-aid splinting. No numbness or tingling.  Past Medical History:  Diagnosis Date   Abnormal screening mammogram 07/01/2013   01/2013   Abnormal vaginal bleeding 01/17/2022   Anemia    Anemia, iron deficiency 11/18/2006   Qualifier: History of  By: Walker Kehr MD, [place]     Arthritis    Breast pain, left 11/28/2014   Carpal tunnel syndrome 07/20/2014   Cervical cancer screening 06/14/2020   CERVICAL RADICULOPATHY, RIGHT 04/26/2008   Qualifier: Diagnosis of  By: Walker Kehr MD, [place] 12/19/2021   Chest tightness 02/11/2012   At rest   Chronic right hip pain 01/11/2016   Colon polyps    Constipation 11/02/2014   Fatigue 06/02/2015   Fibroid uterus 04/26/2021   Generalized joint pain 01/27/2007   Acetaminophen for daily pain, Short course of Ibuprofen for more severe flare.     History of colonic polyps 06/07/2016   Colonoscopy 2010 Hepatic flexure sessile polyp measuring 1.0 cm Transverse colon sessile polyp measuring 4 mm   History of ETT 08/2006   no sustained tachycardia   Hypertension    Left arm pain 11/28/2014   Left knee pain 01/10/2017   Left shoulder pain 01/11/2016   LVH (left ventricular hypertrophy) 06/07/2016   EKG 06/07/16   Memory change 09/05/2021   Menorrhagia 04/21/2012   Morbid obesity ([place]) 10/03/2014   2015  BMI 36 2017  BMI 37  05/2016 ASCVD 2.4%   Neuropathy 11/25/2017   OSA (obstructive sleep apnea) 03/20/2016   Sleep study 07/2015 showed mild OSA. No CPAP recommended.    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 take 2 tabs every 6 hours as needed for pain 30 tablet prn   Biotin 1000 MCG CHEW Chew 1,000 mg by mouth daily.     cetirizine (ZYRTEC ALLERGY) 10 MG tablet Take 1 tablet (10 mg total) by mouth daily. 90 tablet 0   clobetasol (TEMOVATE) 0.05 % external solution Apply 1 Application topically daily as needed.     desonide (DESOWEN) 0.05 % cream Apply 1 Application topically 2 (two) times daily.     diclofenac Sodium (VOLTAREN) 1 % GEL Apply 2 g topically daily as needed (For shoulder pain).     Enalapril-hydroCHLOROthiazide 5-12.5 MG tablet TAKE 1 TABLET BY MOUTH DAILY 90 tablet 1   ferrous sulfate 325 (65 FE) MG EC tablet Take 325 mg by mouth 3 (three) times daily with meals.     fluticasone (FLONASE) 50 MCG/ACT nasal spray Place 2 sprays into both nostrils daily. 16 g 6   ibuprofen (ADVIL) 200 MG tablet  Take 600 mg by mouth every 6 (six) hours as needed for mild pain or moderate pain.     metoprolol succinate (TOPROL-XL) 50 MG 24 hr tablet TAKE 1 TABLET BY MOUTH EVERY  EVENING 90 tablet 0   Current Facility-Administered Medications on File Prior to Visit  Medication Dose Route Frequency Provider Last Rate Last Admin   acetaminophen (TYLENOL) tablet 650 mg  650 mg Oral Once Ajewole, Christana, MD       diphenhydrAMINE (BENADRYL) capsule 25 mg  25 mg Oral Once Ajewole, Christana, MD       iron sucrose (VENOFER) 200 mg in sodium chloride 0.9 % 100 mL IVPB  200 mg Intravenous Once Darliss Cheney, MD        Past Surgical History:  Procedure Laterality Date   CESAREAN SECTION  1990    CESAREAN SECTION  1991   IR ANGIOGRAM PELVIS SELECTIVE OR SUPRASELECTIVE  11/22/2021   IR ANGIOGRAM SELECTIVE EACH ADDITIONAL VESSEL  11/22/2021   IR ANGIOGRAM SELECTIVE EACH ADDITIONAL VESSEL  11/22/2021   IR EMBO TUMOR ORGAN ISCHEMIA INFARCT INC GUIDE ROADMAPPING  11/22/2021   IR RADIOLOGIST EVAL & MGMT  08/14/2021   IR RADIOLOGIST EVAL & MGMT  12/20/2021   IR US GUIDE VASC ACCESS LEFT  11/22/2021   RADIOLOGY WITH ANESTHESIA N/A 01/29/2022   Procedure: CERVICAL SPINE WITHOUT CONTRAST,BRAIN WITH AND WITHOUT CONTRAST;  Surgeon: Radiologist, Medication, MD;  Location: [facility];  Service: Radiology;  Laterality: N/A;   SALPINGECTOMY Right     No Known Allergies  BP 116/70   Ht '5\' 1"'$  (1.549 m)   Wt 184 lb (83.5 kg)   LMP 09/06/2022 (Approximate)   BMI 34.77 kg/m      04/30/2021    9:12 AM  [place] Adult Exercise  Frequency of aerobic exercise (# of days/week) 5  Average time in minutes 20  Frequency of strengthening activities (# of days/week) 0        No data to display              Objective:  Physical Exam:  Gen: NAD, comfortable in exam room  Right hand/thumb: Mild swelling of IP joint of thumb.  Nodule palpated at A1 pulley area of thumb. Able to flex and extend at IP joint with mild discomfort. Tenderness to palpation over nodule above.  No 1st dorsal compartment, base 1st CMC, carpal tunnel tenderness. NVI distally. Negative finkelsteins, tinels.   Assessment & Plan:  1. Right trigger thumb - has tried ibuprofen and bandaid splinting without improvement so far.  Discussed injection - she would like to wait on this for now.  Try diclofenac instead and more rigid splint for extension of IP joint provided.  Consider injection if not improving.  F/u in 3-4 weeks.

## 2022-10-09 ENCOUNTER — Other Ambulatory Visit: Payer: Self-pay | Admitting: Student

## 2022-10-09 DIAGNOSIS — M1811 Unilateral primary osteoarthritis of first carpometacarpal joint, right hand: Secondary | ICD-10-CM

## 2022-10-18 ENCOUNTER — Other Ambulatory Visit: Payer: Self-pay | Admitting: Family Medicine

## 2022-11-11 DIAGNOSIS — H3552 Pigmentary retinal dystrophy: Secondary | ICD-10-CM | POA: Diagnosis not present

## 2022-11-11 DIAGNOSIS — H43393 Other vitreous opacities, bilateral: Secondary | ICD-10-CM | POA: Diagnosis not present

## 2022-11-14 NOTE — Progress Notes (Deleted)
[facility] Note  11/26/2022     CHIEF COMPLAINT Patient presents for No chief complaint on file.   HISTORY OF PRESENT ILLNESS: Adrienne Collins is a 52 y.o. female who presents to the clinic today for:    Referring physician: Leeanne Rio, [place],  [place] [id number]  HISTORICAL INFORMATION:  Selected notes from the MEDICAL RECORD NUMBER Referred by Dr. Truman Hayward:  Ocular Hx- PMH-   CURRENT MEDICATIONS: No current outpatient medications on file. (Ophthalmic Drugs)   No current facility-administered medications for this visit. (Ophthalmic Drugs)   Current Outpatient Medications (Other)  Medication Sig   acetaminophen (TYLENOL) 650 MG CR tablet Take 2 tablets (1,300 mg total) by mouth every 8 (eight) hours as needed for pain. take 2 tabs every 6 hours as needed for pain   Biotin 1000 MCG CHEW Chew 1,000 mg by mouth daily.   cetirizine (ZYRTEC ALLERGY) 10 MG tablet Take 1 tablet (10 mg total) by mouth daily.   clobetasol (TEMOVATE) 0.05 % external solution Apply 1 Application topically daily as needed.   desonide (DESOWEN) 0.05 % cream Apply 1 Application topically 2 (two) times daily.   diclofenac (VOLTAREN) 75 MG EC tablet Take 1 tablet (75 mg total) by mouth 2 (two) times daily.   diclofenac Sodium (VOLTAREN) 1 % GEL Apply 2 g topically daily as needed (For shoulder pain).   Enalapril-hydroCHLOROthiazide 5-12.5 MG tablet TAKE 1 TABLET BY MOUTH DAILY   ferrous sulfate 325 (65 FE) MG EC tablet Take 325 mg by mouth 3 (three) times daily with meals.   fluticasone (FLONASE) 50 MCG/ACT nasal spray Place 2 sprays into both nostrils daily.   ibuprofen (ADVIL) 200 MG tablet Take 600 mg by mouth every 6 (six) hours as needed for mild pain or moderate pain.   metoprolol succinate (TOPROL-XL) 50 MG 24 hr tablet TAKE 1 TABLET BY MOUTH IN THE  EVENING   No current facility-administered medications for this visit. (Other)    Facility-Administered Medications Ordered in Other Visits (Other)  Medication Route   acetaminophen (TYLENOL) tablet 650 mg Oral   diphenhydrAMINE (BENADRYL) capsule 25 mg Oral   iron sucrose (VENOFER) 200 mg in sodium chloride 0.9 % 100 mL IVPB Intravenous   REVIEW OF SYSTEMS:  ALLERGIES No Known Allergies PAST MEDICAL HISTORY Past Medical History:  Diagnosis Date   Abnormal screening mammogram 07/01/2013   01/2013   Abnormal vaginal bleeding 01/17/2022   Anemia    Anemia, iron deficiency 11/18/2006   Qualifier: History of  By: Walker Kehr MD, [place]     Arthritis    Breast pain, left 11/28/2014   Carpal tunnel syndrome 07/20/2014   Cervical cancer screening 06/14/2020   CERVICAL RADICULOPATHY, RIGHT 04/26/2008   Qualifier: Diagnosis of  By: Walker Kehr MD, [place] 12/19/2021   Chest tightness 02/11/2012   At rest   Chronic right hip pain 01/11/2016   Colon polyps    Constipation 11/02/2014   Fatigue 06/02/2015   Fibroid uterus 04/26/2021   Generalized joint pain 01/27/2007   Acetaminophen for daily pain, Short course of Ibuprofen for more severe flare.     History of colonic polyps 06/07/2016   Colonoscopy 2010 Hepatic flexure sessile polyp measuring 1.0 cm Transverse colon sessile polyp measuring 4 mm   History of ETT 08/2006   no sustained tachycardia   Hypertension    Left arm pain 11/28/2014   Left knee pain 01/10/2017  Left shoulder pain 01/11/2016   LVH (left ventricular hypertrophy) 06/07/2016   EKG 06/07/16   Memory change 09/05/2021   Menorrhagia 04/21/2012   Morbid obesity ([place]) 10/03/2014   2015  BMI 36 2017  BMI 37  05/2016 ASCVD 2.4%   Neuropathy 11/25/2017   OSA (obstructive sleep apnea) 03/20/2016   Sleep study 07/2015 showed mild OSA. No CPAP recommended.    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   Unspecified vitamin D deficiency 12/09/2012   Level 23 on 12/08/12 and Vitamin D3 1000-2000 IU daily was recommended   Uterine leiomyoma 11/22/2021   Past Surgical History:  Procedure Laterality Date   CESAREAN SECTION  1990   CESAREAN SECTION  1991   IR ANGIOGRAM PELVIS SELECTIVE OR SUPRASELECTIVE  11/22/2021   IR ANGIOGRAM SELECTIVE EACH ADDITIONAL VESSEL  11/22/2021   IR ANGIOGRAM SELECTIVE EACH ADDITIONAL VESSEL  11/22/2021   IR EMBO TUMOR ORGAN ISCHEMIA INFARCT INC GUIDE ROADMAPPING  11/22/2021   IR RADIOLOGIST EVAL & MGMT  08/14/2021   IR RADIOLOGIST EVAL & MGMT  12/20/2021   IR US GUIDE VASC ACCESS LEFT  11/22/2021   RADIOLOGY WITH ANESTHESIA N/A 01/29/2022   Procedure: CERVICAL SPINE WITHOUT CONTRAST,BRAIN WITH AND WITHOUT CONTRAST;  Surgeon: Radiologist, Medication, MD;  Location: [facility];  Service: Radiology;  Laterality: N/A;   SALPINGECTOMY Right    FAMILY HISTORY Family History  Problem Relation Age of Onset   Stroke Sister        drug abuse   Diabetes Brother    Kidney failure Brother    Diabetes Mother    Hypertension Mother    Heart failure Mother    Kidney disease Mother    Lung cancer Maternal Grandfather        smoker   SOCIAL HISTORY Social History   Tobacco Use   Smoking status: Former    Types: Cigarettes    Quit date: 01/30/1994    Years since quitting: 28.8   Smokeless tobacco: Never  Substance Use Topics   Alcohol use: No    Comment: quit 1995   Drug use: No       OPHTHALMIC EXAM:  Not recorded     IMAGING AND PROCEDURES  Imaging and Procedures for 11/26/2022        ASSESSMENT/PLAN: No diagnosis found. 1.  2.  3.  Ophthalmic Meds Ordered this visit:  No orders of the defined types were placed in this encounter.    No follow-ups on file.  There are no Patient Instructions on file for this visit.  Explained the diagnoses, plan, and follow up with the patient and they expressed understanding.  Patient  expressed understanding of the importance of proper follow up care.   This document serves as a record of services personally performed by Gardiner Sleeper, MD, PhD. It was created on their behalf by Renaldo Reel, [place] an ophthalmic technician. The creation of this record is the provider's dictation and/or activities during the visit.    Electronically signed by:  Renaldo Reel, COT  4.4.24 10:24 AM   Gardiner Sleeper, M.D., Ph.D. Diseases & Surgery of the Retina and [place] 11/26/2022   Abbreviations: M myopia (nearsighted); A astigmatism; H hyperopia (farsighted); P presbyopia; Mrx spectacle prescription;  CTL contact  lenses; OD right eye; OS left eye; OU both eyes  XT exotropia; ET esotropia; PEK punctate epithelial keratitis; PEE punctate epithelial erosions; DES dry eye syndrome; MGD meibomian gland dysfunction; ATs artificial tears; PFAT's preservative free artificial tears; [place] nuclear sclerotic cataract; PSC posterior subcapsular cataract; ERM epi-retinal membrane; PVD posterior vitreous detachment; RD retinal detachment; DM diabetes mellitus; DR diabetic retinopathy; NPDR non-proliferative diabetic retinopathy; PDR proliferative diabetic retinopathy; CSME clinically significant macular edema; DME diabetic macular edema; dbh dot blot hemorrhages; CWS cotton wool spot; POAG primary open angle glaucoma; C/D cup-to-disc ratio; HVF humphrey visual field; GVF goldmann visual field; OCT optical coherence tomography; IOP intraocular pressure; BRVO Branch retinal vein occlusion; CRVO central retinal vein occlusion; CRAO central retinal artery occlusion; BRAO branch retinal artery occlusion; RT retinal tear; SB scleral buckle; PPV pars plana vitrectomy; VH Vitreous hemorrhage; PRP panretinal laser photocoagulation; IVK intravitreal kenalog; VMT vitreomacular traction; MH Macular hole;  NVD neovascularization of the disc; NVE neovascularization elsewhere; AREDS  age related eye disease study; ARMD age related macular degeneration; POAG primary open angle glaucoma; EBMD epithelial/anterior basement membrane dystrophy; ACIOL anterior chamber intraocular lens; IOL intraocular lens; PCIOL posterior chamber intraocular lens; Phaco/IOL phacoemulsification with intraocular lens placement; [place] photorefractive keratectomy; LASIK laser assisted in situ keratomileusis; HTN hypertension; DM diabetes mellitus; COPD chronic obstructive pulmonary disease

## 2022-11-18 NOTE — Progress Notes (Addendum)
[facility] - Clinic Note  11/26/2022     CHIEF COMPLAINT Patient presents for Retina Evaluation   HISTORY OF PRESENT ILLNESS: Adrienne Collins is a 52 y.o. female who presents to the clinic today for:  HPI     Retina Evaluation   In both eyes.  This started 8 days ago.  Duration of 8 days.  Associated Symptoms Floaters.  I, the attending physician,  performed the HPI with the patient and updated documentation appropriately.        Comments   Pt here for ret eval referred by Dr. Harriette BouillonMcFarland for pigment ret. Dyst. Pt states that around 4/8 she would see a black spot/wide stripe in OS that would come and go when she would blink. Pt reports she is now not seeing the stripe. No blurriness. Sees floaters but nothing new, no FOL. Hx of HTN (about 20 years)/rapid heart rate.       Last edited by Thompson GrayerSimpson, Makenzie E, COT on 11/26/2022  9:03 AM.    Pt is here on the referral of Dr. 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 Geryl CouncilmanMacFarland [facility] [address] [place] [id number]  HISTORICAL INFORMATION:  Selected notes from the MEDICAL RECORD NUMBER Referred by Dr. 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 (Ophthalmic Drugs)   No current facility-administered medications for this visit. (Ophthalmic Drugs)   Current Outpatient Medications (Other)  Medication Sig   acetaminophen (TYLENOL) 650 MG CR tablet Take 2 tablets (1,300 mg total) by mouth every 8 (eight) hours as needed for pain. take 2 tabs every 6 hours as needed for pain   Biotin 1000 MCG CHEW Chew 1,000 mg by mouth daily.   cetirizine (ZYRTEC ALLERGY)  10 MG tablet Take 1 tablet (10 mg total) by mouth daily.   diclofenac (VOLTAREN) 75 MG EC tablet Take 1 tablet (75 mg total) by mouth 2 (two) times daily.   diclofenac Sodium (VOLTAREN) 1 % GEL Apply 2 g topically daily as needed (For shoulder pain).   Enalapril-hydroCHLOROthiazide 5-12.5 MG tablet TAKE 1 TABLET BY MOUTH DAILY   ferrous sulfate 325 (65 FE) MG EC tablet Take 325 mg by mouth 3 (three) times daily with meals.   fluticasone (FLONASE) 50 MCG/ACT nasal spray Place 2 sprays into both nostrils daily.   ibuprofen (ADVIL) 200 MG tablet Take 600 mg by mouth every 6 (six) hours as needed for mild pain or moderate pain.   metoprolol succinate (TOPROL-XL) 50 MG 24 hr tablet TAKE 1 TABLET BY MOUTH IN THE  EVENING   clobetasol (TEMOVATE) 0.05 % external solution Apply 1 Application topically daily as needed. (Patient not taking: Reported on 11/26/2022)   desonide (DESOWEN) 0.05 % cream Apply 1 Application topically 2 (two) times daily. (Patient not taking: Reported on 11/26/2022)   No current facility-administered medications for this visit. (Other)   Facility-Administered Medications Ordered in Other Visits (Other)  Medication Route   acetaminophen (TYLENOL) tablet 650 mg Oral   diphenhydrAMINE (BENADRYL) capsule 25 mg Oral   iron sucrose (VENOFER) 200 mg in sodium chloride 0.9 % 100 mL IVPB Intravenous  REVIEW OF SYSTEMS: ROS   Positive for: Musculoskeletal, Cardiovascular, Eyes, Allergic/Imm Negative for: Constitutional, Gastrointestinal, Neurological, Skin, Genitourinary, HENT, Endocrine, Respiratory, Psychiatric, Heme/Lymph Last edited by Thompson Grayer, COT on 11/26/2022  9:03 AM.     ALLERGIES No Known Allergies PAST MEDICAL HISTORY Past Medical History:  Diagnosis Date   Abnormal screening mammogram 07/01/2013   01/2013   Abnormal vaginal bleeding 01/17/2022   Anemia    Anemia, iron deficiency 11/18/2006   Qualifier: History of  By: Sheffield Slider MD, [place]     Arthritis    Breast  pain, left 11/28/2014   Carpal tunnel syndrome 07/20/2014   Cervical cancer screening 06/14/2020   CERVICAL RADICULOPATHY, RIGHT 04/26/2008   Qualifier: Diagnosis of  By: Sheffield Slider MD, [place]     Cervicalgia 12/19/2021   Chest tightness 02/11/2012   At rest   Chronic right hip pain 01/11/2016   Colon polyps    Constipation 11/02/2014   Fatigue 06/02/2015   Fibroid uterus 04/26/2021   Generalized joint pain 01/27/2007   Acetaminophen for daily pain, Short course of Ibuprofen for more severe flare.     History of colonic polyps 06/07/2016   Colonoscopy 2010 Hepatic flexure sessile polyp measuring 1.0 cm Transverse colon sessile polyp measuring 4 mm   History of ETT 08/2006   no sustained tachycardia   Hypertension    Left arm pain 11/28/2014   Left knee pain 01/10/2017   Left shoulder pain 01/11/2016   LVH (left ventricular hypertrophy) 06/07/2016   EKG 06/07/16   Memory change 09/05/2021   Menorrhagia 04/21/2012   Morbid obesity 10/03/2014   2015  BMI 36 2017  BMI 37  05/2016 ASCVD 2.4%   Neuropathy 11/25/2017   OSA (obstructive sleep apnea) 03/20/2016   Sleep study 07/2015 showed mild OSA. No CPAP recommended.    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   Unspecified vitamin D deficiency 12/09/2012   Level 23 on 12/08/12 and Vitamin D3 1000-2000 IU daily was recommended   Uterine leiomyoma 11/22/2021   Past Surgical History:  Procedure Laterality Date   CESAREAN SECTION  1990   CESAREAN SECTION  1991   IR ANGIOGRAM PELVIS SELECTIVE OR SUPRASELECTIVE  11/22/2021   IR ANGIOGRAM SELECTIVE EACH ADDITIONAL VESSEL  11/22/2021   IR ANGIOGRAM SELECTIVE EACH ADDITIONAL VESSEL  11/22/2021   IR EMBO TUMOR ORGAN ISCHEMIA INFARCT INC GUIDE ROADMAPPING  11/22/2021   IR RADIOLOGIST EVAL & MGMT  08/14/2021   IR RADIOLOGIST  EVAL & MGMT  12/20/2021   IR US GUIDE VASC ACCESS LEFT  11/22/2021   RADIOLOGY WITH ANESTHESIA N/A 01/29/2022   Procedure: CERVICAL SPINE WITHOUT CONTRAST,BRAIN WITH AND WITHOUT CONTRAST;  Surgeon: Radiologist, Medication, MD;  Location: [facility];  Service: Radiology;  Laterality: N/A;   SALPINGECTOMY Right    FAMILY HISTORY Family History  Problem Relation Age of Onset   Glaucoma Mother    Diabetes Mother    Hypertension Mother    Heart failure Mother    Kidney disease Mother    Stroke Sister        drug abuse   Diabetes Brother    Kidney failure Brother    Lung cancer Maternal Grandfather        smoker   SOCIAL HISTORY Social History   Tobacco Use   Smoking  status: Former    Types: Cigarettes    Quit date: 01/30/1994    Years since quitting: 28.8   Smokeless tobacco: Never  Substance Use Topics   Alcohol use: No    Comment: quit 1995   Drug use: No       OPHTHALMIC EXAM:  Base Eye Exam     Visual Acuity (Snellen - Linear)       Right Left   Dist cc 20/20 20/20    Correction: Glasses         Tonometry (Tonopen, 9:10 AM)       Right Left   Pressure 17 19         Pupils       Pupils Dark Light Shape React APD   Right PERRL 3 2 Round Brisk None   Left PERRL 3 2 Round Brisk None         Visual Fields (Counting fingers)       Left Right    Full Full         Extraocular Movement       Right Left    Full, Ortho Full, Ortho         Neuro/Psych     Oriented x3: Yes   Mood/Affect: Normal         Dilation     Both eyes: 1.0% Mydriacyl, 2.5% Phenylephrine @ 9:11 AM           Slit Lamp and Fundus Exam     Slit Lamp Exam       Right Left   Lids/Lashes Dermatochalasis - upper lid Dermatochalasis - upper lid   Conjunctiva/Sclera White and quiet White and quiet   Cornea trace PEE, trace tear film debris trace PEE, trace tear film debris   Anterior Chamber deep and clear deep and clear   Iris Round and dilated Round and dilated    Lens 1-2+ Nuclear sclerosis, 2+ Cortical cataract 2+ Nuclear sclerosis, 2+ Cortical cataract   Anterior Vitreous mild syneresis mild syneresis         Fundus Exam       Right Left   Disc Pink and Sharp, Compact Pink and Sharp, Compact   C/D Ratio 0.2 0.2   Macula Flat, Good foveal reflex, No heme or edema Flat, Good foveal reflex, No heme or edema   Vessels mild tortuosity mild tortuosity   Periphery Attached, focal myelinated NFL at 1000 Attached, myelinated NFL nasal midzone           Refraction     Wearing Rx       Sphere Cylinder Axis   Right -5.00 +0.25 164   Left -5.25 +0.75 094    Age: 52 yr   Type: Single Vision  Rx specs less than a year old MS           IMAGING AND PROCEDURES  Imaging and Procedures for 11/26/2022  OCT, Retina - OU - Both Eyes       Right Eye Quality was good. Central Foveal Thickness: 222. Progression has no prior data. Findings include normal foveal contour, no IRF, no SRF, vitreomacular adhesion .   Left Eye Quality was good. Central Foveal Thickness: 221. Progression has no prior data. Findings include normal foveal contour, no IRF, no SRF, vitreomacular adhesion .   Notes *Images captured and stored on drive  Diagnosis / Impression:  NFP, no IRF/SRF OU  Clinical management:  See below  Abbreviations: NFP - Normal foveal profile.  CME - cystoid macular edema. PED - pigment epithelial detachment. IRF - intraretinal fluid. SRF - subretinal fluid. EZ - ellipsoid zone. ERM - epiretinal membrane. ORA - outer retinal atrophy. ORT - outer retinal tubulation. SRHM - subretinal hyper-reflective material. 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 Vitreous syneresis of both eyes  H43.393     3. Essential hypertension  I10     4. Hypertensive retinopathy of both eyes  H35.033 OCT, Retina - OU - Both  Eyes    5. Nuclear sclerosis of both eyes  H25.13      1. Myelinated NFL OU  - focal patches of myelinated NFL OU -- nasal peripheral midzone  - discussed findings, prognosis - no retinal or ophthalmic interventions indicated or recommended   - monitor  - pt is cleared from a retina standpoint for release to Dr. Harriette Bouillon and resumption of primary eye care   2. Vitreous syneresis w/ history of floaters and visual disturbances  - intermittent frequency - currently resolved  - monitor  3,4. Hypertensive retinopathy OU - discussed importance of tight BP control - monitor   5. Nuclear sclerosis OU - The symptoms of cataract, surgical options, and treatments and risks were discussed with patient. - discussed diagnosis and progression - monitor   Ophthalmic Meds Ordered this visit:  No orders of the defined types were placed in this encounter.    Return if symptoms worsen or fail to improve.  There are no Patient Instructions on file for this visit.  Explained the diagnoses, plan, and follow up with the patient and they expressed understanding.  Patient expressed understanding of the importance of proper follow up care.   This document serves as a record of services personally performed by Karie Chimera, MD, PhD. It was created on their behalf by Gerilyn Nestle, COT an ophthalmic technician. The creation of this record is the provider's dictation and/or activities during the visit.    Electronically signed by:  Gerilyn Nestle, COT  04.08.24 9:09 PM  This document serves as a record of services personally performed by Karie Chimera, MD, PhD. It was created on their behalf by Glee Arvin. Manson Passey, OA an ophthalmic technician. The creation of this record is the provider's dictation and/or activities during the visit.    Electronically signed by: Glee Arvin. Kristopher Oppenheim 04.16.2024 9:09 PM  Karie Chimera, M.D., Ph.D. Diseases & Surgery of the Retina and Vitreous [facility] 11/26/2022  I have reviewed the above documentation for accuracy and completeness, and I agree with the above. Karie Chimera, M.D., Ph.D. 11/26/22 9:09 PM  Abbreviations: M myopia (nearsighted); A astigmatism; H hyperopia (farsighted); P presbyopia; Mrx spectacle prescription;  CTL contact lenses; OD right eye; OS left eye; OU both eyes  XT exotropia; ET esotropia; PEK punctate epithelial keratitis; PEE punctate epithelial erosions; DES dry eye syndrome; MGD meibomian gland dysfunction; ATs artificial tears; PFAT's preservative free artificial tears; NSC nuclear sclerotic cataract; PSC posterior subcapsular cataract; ERM epi-retinal membrane; PVD posterior vitreous detachment; RD retinal detachment; DM diabetes mellitus; DR diabetic retinopathy; NPDR non-proliferative diabetic retinopathy; PDR proliferative diabetic retinopathy; CSME clinically significant macular edema; DME diabetic macular edema; dbh dot blot hemorrhages; CWS cotton wool spot; POAG primary open angle glaucoma; C/D cup-to-disc ratio; HVF humphrey visual  field; GVF goldmann visual field; OCT optical coherence tomography; IOP intraocular pressure; BRVO Branch retinal vein occlusion; CRVO central retinal vein occlusion; CRAO central retinal artery occlusion; BRAO branch retinal artery occlusion; RT retinal tear; SB scleral buckle; PPV pars plana vitrectomy; VH Vitreous hemorrhage; PRP panretinal laser photocoagulation; IVK intravitreal kenalog; VMT vitreomacular traction; MH Macular hole;  NVD neovascularization of the disc; NVE neovascularization elsewhere; AREDS age related eye disease study; ARMD age related macular degeneration; POAG primary open angle glaucoma; EBMD epithelial/anterior basement membrane dystrophy; ACIOL anterior chamber intraocular lens; IOL intraocular lens; PCIOL posterior chamber intraocular lens; Phaco/IOL phacoemulsification with intraocular lens placement; PRK photorefractive keratectomy; LASIK  laser assisted in situ keratomileusis; HTN hypertension; DM diabetes mellitus; COPD chronic obstructive pulmonary disease

## 2022-11-26 ENCOUNTER — Ambulatory Visit (INDEPENDENT_AMBULATORY_CARE_PROVIDER_SITE_OTHER): Payer: BC Managed Care – PPO | Admitting: Ophthalmology

## 2022-11-26 ENCOUNTER — Encounter (INDEPENDENT_AMBULATORY_CARE_PROVIDER_SITE_OTHER): Payer: Self-pay | Admitting: Ophthalmology

## 2022-11-26 DIAGNOSIS — H43393 Other vitreous opacities, bilateral: Secondary | ICD-10-CM

## 2022-11-26 DIAGNOSIS — H35033 Hypertensive retinopathy, bilateral: Secondary | ICD-10-CM | POA: Diagnosis not present

## 2022-11-26 DIAGNOSIS — I10 Essential (primary) hypertension: Secondary | ICD-10-CM | POA: Diagnosis not present

## 2022-11-26 DIAGNOSIS — H359 Unspecified retinal disorder: Secondary | ICD-10-CM

## 2022-11-26 DIAGNOSIS — H2513 Age-related nuclear cataract, bilateral: Secondary | ICD-10-CM

## 2022-11-26 DIAGNOSIS — H3581 Retinal edema: Secondary | ICD-10-CM

## 2022-12-11 ENCOUNTER — Encounter: Payer: Self-pay | Admitting: Student

## 2022-12-11 ENCOUNTER — Ambulatory Visit: Payer: BC Managed Care – PPO | Admitting: Student

## 2022-12-11 VITALS — BP 112/64 | HR 70 | Ht 61.0 in | Wt 191.8 lb

## 2022-12-11 DIAGNOSIS — R1032 Left lower quadrant pain: Secondary | ICD-10-CM

## 2022-12-11 LAB — POCT URINE PREGNANCY: Preg Test, Ur: NEGATIVE

## 2022-12-11 MED ORDER — NAPROXEN 500 MG PO TABS: 500.0000 mg | ORAL_TABLET | Freq: Two times a day (BID) | ORAL | 0 refills | Status: AC

## 2022-12-11 NOTE — Progress Notes (Signed)
    SUBJECTIVE:   CHIEF COMPLAINT / HPI: Abdominal pain  Sunday she started noticing abdominal pain when driving Left sided pain Sharp pain Worse when walking, breathing in and out and position changes Uterine fibroid embolization last year Right fallopian tube cyst removal in 2009 No cycle last month or month after No extra activity No vomiting or diarrhea No fevers BM once a day-not super hard but does have hx of constipation No vaginal bleeding or discharge No pain with peeing or urgency/frequency Took 3 ibuprofen before going to bed but didn't help much  Last pelvic U/S 06/2022  Enlarged uterus containing multiple uterine leiomyomata, largest 5.7 cm diameter at upper uterine segment decreased in size since previous exam. Unremarkable ovaries and adnexa.  PERTINENT  PMH / PSH: hx leiomyoma  OBJECTIVE:   BP 112/64   Pulse 70   Ht 5\' 1"  (1.549 m)   Wt 191 lb 12.8 oz (87 kg)   SpO2 98%   BMI 36.24 kg/m   General: Well appearing, NAD, awake, alert, responsive to questions Head: Normocephalic atraumatic CV: Regular rate and rhythm no murmurs rubs or gallops Respiratory: Clear to ausculation bilaterally, no wheezes rales or crackles, chest rises symmetrically,  no increased work of breathing Abdomen: Soft, tender to palpation of LLQ, no rebound or gaurding, non-distended, normoactive bowel sounds; no CVA tenderness Extremities: Moves upper and lower extremities freely, no edema in LE Neuro: No focal deficits Skin: No rashes or lesions visualized   ASSESSMENT/PLAN:   Left lower quadrant abdominal pain Differential includes muscular given patient has some tenderness over this area and worsens with breathing/movement.  Will treat with anti-inflammatory-advised not using other NSAIDs while using this.  Also consider ovarian or fallopian tube pathology, could consider pelvic ultrasound if this worsens. There is no vaginal bleeding or vaginal discharge currently.   Constipation could be playing into this as well and I advised fiber intake with increased fluid to help with this.  Will obtain baseline labs to ensure there is no infection underlying or CMP.  Urine pregnancy test negative in office. - naproxen (NAPROSYN) 500 MG tablet; Take 1 tablet (500 mg total) by mouth 2 (two) times daily with a meal for 7 days.  Dispense: 14 tablet; Refill: 0 - Comprehensive metabolic panel - CBC - ED/Return precautions discussed  Levin Erp, MD [facility]

## 2022-12-11 NOTE — Patient Instructions (Signed)
It was great to see you! Thank you for allowing me to participate in your care!   Our plans for today:  - Naproxen twice a day for 7 days- with meals, avoid other NSAIDs with this - I am getting some labs done today to make sure we are not missing anything intrabdominal - Please return if having vomiting, fevers, worsening of pain or not improving after a week  Take care and seek immediate care sooner if you develop any concerns.  Levin Erp, MD

## 2022-12-12 LAB — COMPREHENSIVE METABOLIC PANEL
ALT: 23 IU/L (ref 0–32)
AST: 23 IU/L (ref 0–40)
Albumin/Globulin Ratio: 1.8 (ref 1.2–2.2)
Albumin: 4.5 g/dL (ref 3.8–4.9)
Alkaline Phosphatase: 85 IU/L (ref 44–121)
BUN/Creatinine Ratio: 21 (ref 9–23)
BUN: 13 mg/dL (ref 6–24)
Bilirubin Total: 0.4 mg/dL (ref 0.0–1.2)
CO2: 23 mmol/L (ref 20–29)
Calcium: 9.5 mg/dL (ref 8.7–10.2)
Chloride: 103 mmol/L (ref 96–106)
Creatinine, Ser: 0.62 mg/dL (ref 0.57–1.00)
Globulin, Total: 2.5 g/dL (ref 1.5–4.5)
Glucose: 90 mg/dL (ref 70–99)
Potassium: 4 mmol/L (ref 3.5–5.2)
Sodium: 139 mmol/L (ref 134–144)
Total Protein: 7 g/dL (ref 6.0–8.5)
eGFR: 108 mL/min/{1.73_m2} (ref >59)

## 2022-12-12 LAB — CBC
Hematocrit: 36.6 % (ref 34.0–46.6)
Hemoglobin: 11.4 g/dL (ref 11.1–15.9)
MCH: 25.5 pg — ABNORMAL LOW (ref 26.6–33.0)
MCHC: 31.1 g/dL — ABNORMAL LOW (ref 31.5–35.7)
MCV: 82 fL (ref 79–97)
Platelets: 242 10*3/uL (ref 150–450)
RBC: 4.47 x10E6/uL (ref 3.77–5.28)
RDW: 15 % (ref 11.7–15.4)
WBC: 7.9 10*3/uL (ref 3.4–10.8)

## 2022-12-22 ENCOUNTER — Other Ambulatory Visit: Payer: Self-pay | Admitting: Family Medicine

## 2022-12-24 NOTE — Telephone Encounter (Signed)
Please let patient know I am refilling this medication, but she needs to schedule an appointment with me.   Thanks, Lafreda Casebeer J Bedelia Pong, MD  

## 2022-12-26 NOTE — Telephone Encounter (Signed)
Called and schedule patient

## 2023-02-10 ENCOUNTER — Encounter: Payer: Self-pay | Admitting: Family Medicine

## 2023-02-10 ENCOUNTER — Other Ambulatory Visit: Payer: Self-pay

## 2023-02-10 ENCOUNTER — Ambulatory Visit: Payer: BC Managed Care – PPO | Admitting: Family Medicine

## 2023-02-10 VITALS — BP 129/61 | HR 53 | Ht 61.0 in | Wt 197.0 lb

## 2023-02-10 DIAGNOSIS — N920 Excessive and frequent menstruation with regular cycle: Secondary | ICD-10-CM | POA: Diagnosis not present

## 2023-02-10 DIAGNOSIS — R7303 Prediabetes: Secondary | ICD-10-CM

## 2023-02-10 DIAGNOSIS — I10 Essential (primary) hypertension: Secondary | ICD-10-CM

## 2023-02-10 LAB — POCT GLYCOSYLATED HEMOGLOBIN (HGB A1C): HbA1c, POC (prediabetic range): 5.8 % (ref 5.7–6.4)

## 2023-02-10 MED ORDER — SHINGRIX 50 MCG/0.5ML IM SUSR
INTRAMUSCULAR | 1 refills | Status: DC
Start: 2023-02-10 — End: 2024-07-05

## 2023-02-10 NOTE — Assessment & Plan Note (Signed)
Much improved after uterine artery embolization procedure.  Check hemoglobin today with CBC.

## 2023-02-10 NOTE — Assessment & Plan Note (Addendum)
Update A1c today along with direct LDL at patient's request.

## 2023-02-10 NOTE — Patient Instructions (Signed)
It was great to see you again today.  Checking CBC, cholesterol, A1c today  Call sports medicine for an appointment for your thumb  Take shingles vaccine prescription to your pharmacy   Be well, Dr. Pollie Meyer

## 2023-02-10 NOTE — Progress Notes (Signed)
  Date of Visit: 02/10/2023   SUBJECTIVE:   HPI:  Adrienne Collins presents today for routine follow up.  This is the first time I seen her since September 2022.  Hypertension: Currently taking enalapril-HCTZ 5-12.5 mg daily and metoprolol XL 50 mg daily.  Tolerating these medications well.  Right thumb pain: Having ongoing pain and difficulty with movement of her right thumb.  Previously seen here and was given prescription for meloxicam, then followed up with sports medicine who prescribed diclofenac tablets.  Pain persists.  She does still take the diclofenac some.  She is now amenable to an injection and is willing to follow-up with sports medicine for that.  Menorrhagia: Since I last saw her she underwent bilateral uterine artery embolization for her menorrhagia.  She has been doing quite well since then, has just occasional bleeding, much improved from prior.  She is not taking any iron.  Last hemoglobin was over 11.  She is agreeable to rechecking this today.  OBJECTIVE:   BP 129/61   Pulse (!) 53   Ht 5\' 1"  (1.549 m)   Wt 197 lb (89.4 kg)   SpO2 100%   BMI 37.22 kg/m  Gen: No acute distress, pleasant, cooperative HEENT: Normocephalic, atraumatic Heart: Regular rate and rhythm, no murmur Lungs: Clear to auscultation bilaterally, normal effort Neuro: Alert, grossly nonfocal, speech normal Ext: Right thumb with obvious chronic appearing swelling compared to left, no erythema or warmth.  Grip 5 out of 5 bilaterally, full range of motion of thumb.  ASSESSMENT/PLAN:   Health maintenance:  -Discussed shingles vaccine, given prescription  Prediabetes Update A1c today along with direct LDL at patient's request.  Menorrhagia Much improved after uterine artery embolization procedure.  Check hemoglobin today with CBC.  Hypertension Well-controlled, continue current regimen  Trigger thumb Encouraged to follow-up with sports medicine for injection, as that was the plan if it did not  improve at last visit with sports medicine.  FOLLOW UP: Follow up in 6 mos for routine medical problems  [place] J. Pollie Meyer, MD [facility]

## 2023-02-10 NOTE — Assessment & Plan Note (Deleted)
Check A1c. 

## 2023-02-10 NOTE — Assessment & Plan Note (Signed)
Well-controlled, continue current regimen 

## 2023-02-11 LAB — LDL CHOLESTEROL, DIRECT: LDL Direct: 101 mg/dL — ABNORMAL HIGH (ref 0–99)

## 2023-02-11 LAB — CBC
Hematocrit: 36.7 % (ref 34.0–46.6)
Hemoglobin: 11.3 g/dL (ref 11.1–15.9)
MCH: 25.7 pg — ABNORMAL LOW (ref 26.6–33.0)
MCHC: 30.8 g/dL — ABNORMAL LOW (ref 31.5–35.7)
MCV: 84 fL (ref 79–97)
Platelets: 230 10*3/uL (ref 150–450)
RBC: 4.39 x10E6/uL (ref 3.77–5.28)
RDW: 14.6 % (ref 11.7–15.4)
WBC: 4.3 10*3/uL (ref 3.4–10.8)

## 2023-03-01 ENCOUNTER — Other Ambulatory Visit: Payer: Self-pay | Admitting: Family Medicine

## 2023-04-15 DIAGNOSIS — Z1231 Encounter for screening mammogram for malignant neoplasm of breast: Secondary | ICD-10-CM | POA: Diagnosis not present

## 2023-06-16 ENCOUNTER — Telehealth: Payer: Self-pay

## 2023-06-16 ENCOUNTER — Telehealth: Payer: Self-pay | Admitting: Student

## 2023-06-16 ENCOUNTER — Ambulatory Visit (INDEPENDENT_AMBULATORY_CARE_PROVIDER_SITE_OTHER): Payer: BC Managed Care – PPO | Admitting: Student

## 2023-06-16 VITALS — BP 132/70 | HR 78 | Ht 61.0 in | Wt 206.6 lb

## 2023-06-16 DIAGNOSIS — R0789 Other chest pain: Secondary | ICD-10-CM | POA: Diagnosis not present

## 2023-06-16 MED ORDER — NITROGLYCERIN 0.3 MG SL SUBL: 0.3000 mg | SUBLINGUAL_TABLET | SUBLINGUAL | 12 refills | Status: AC | PRN

## 2023-06-16 MED ORDER — ASPIRIN 81 MG PO TBEC: 81.0000 mg | DELAYED_RELEASE_TABLET | Freq: Every day | ORAL | Status: AC

## 2023-06-16 NOTE — Progress Notes (Signed)
    SUBJECTIVE:   CHIEF COMPLAINT / HPI:   Chest Tightness Patient comes in today describing chest tightness that has been coming and going for the past week or so.  Does not seem to be associated with any specific activity or movement.  She is not having any right now as we speak.  The episodes last anywhere from a few to several minutes and then pass.  Sometimes feels like a squeezing in the center and upper portion of her chest.  She does also endorse some back pain but thinks that this is related to a pulled muscle because she can reproduce it by looking to the right and it feels like something is pulling in between her scapulae.  She does not have a known history of CAD, she does follow with Dr. Tomie China for palpitations and by review of his last note, he had wanted a coronary calcium score on her but this never happened. She is on a B-blocker (Toprol XL 50mg  at bedtime) and reports that this does a good job of controlling her palpitations.   PERTINENT  PMH / PSH: HTN, OSA, Obesity, HLD  OBJECTIVE:   BP 132/70   Pulse 78   Ht 5\' 1"  (1.549 m)   Wt 206 lb 9.6 oz (93.7 kg)   SpO2 99%   BMI 39.04 kg/m   Gen: Very well appearing and NAD, in good spirits Chest: Chest wall is non-tender to palpation, lungs are clear to auscultation throughout Cardiac: RRR, 2/6 systolic murmur heard best at the apex Ext: BLE without edema or deformity   ASSESSMENT/PLAN:   Chest tightness EKG today appears stable, nearly identical in morphology when compared to her baseline from 2019.  No ST or T or T wave changes to suggest acute ischemia.  Though with her longstanding history of hypertension and obesity, it is certainly possible that she may have some coronary artery disease. Considered CTA coronary, but given that she is already established with Dr. Tomie China, I would defer CAD workup to his office. She thinks she can get an appointment with them rather quickly.  - Adding ASA 81mg  daily until and unless  Dr. Kem Parkinson workup is benign - Already on a B-blocker - Given SL nitroglycerin with instructions to present to ED if CP does not resolve with 2 films - ER precautions reviewed     J Dorothyann Gibbs, MD [facility]

## 2023-06-16 NOTE — Telephone Encounter (Signed)
Patient calls nurse line reporting intermittent chest discomfort.   She reports symptoms have been present for ~ 1 week. She reports when she coughs she feels much better. However, she denies having any active cold symptoms or chest congestion.   She reports she has been checking her pulse ox and reports WNL.   She denies any SOB, crushing chest pains, dizziness, nausea/vomiting, headaches, vision changes or jaw tightness.   Patient scheduled for tomorrow for evaluation.   Strict precautions discussed in the meantime.

## 2023-06-16 NOTE — Telephone Encounter (Signed)
**  After Hours/ Emergency Line Call**  Received a call to report that Adrienne Collins and confirmed DOB on phone. Issues with chest pain and tightness comes and goes for the past week.  Was seen in clinic by Dr. Marisue Humble and got an EKG that showed no ischemic changes.  INTERCHEST score of 1 which was reassuring and was told to take 2 nitroglycerin sublingually and present to ED if chest pain does not resolve.  She denies any current chest pain but says that it comes and goes.  Denies any dyspnea currently or any fevers or cough.  Recommended that I will call the pharmacy about the nitroglycerin prescription and if she continues to have chest pain, worsening symptoms, shortness of breath, fevers to go to the emergency department immediately.  Red flags discussed.  Will forward to PCP.  Called [facility] and discussed situation.  They stated that because the nitroglycerin was sent as a quantity of 90 tablet instead of 100 it was having issues being filled but pharmacist was able to change this and will fill this right now.  Adrienne Collins PGY-3, [facility] 06/16/2023 7:29 PM

## 2023-06-16 NOTE — Telephone Encounter (Signed)
Called patient, left VM with clinic number to call back to schedule to be seen this afternoon and also left clinic address to come to be worked in this afternoon.  Burley Saver MD

## 2023-06-16 NOTE — Patient Instructions (Addendum)
Ms. Budney,   It is great to meet you! I'm sure this chest pain has been scary for you. It certainly could be related to your heart, but I'm reassured that you do not seem to be having a big heart attack or anything right now.   I am sending you home with a prescription for sublingual nitroglycerin. If you start having chest pain, sit down for 3-5 minutes. If it goes away, great. If it does not go away with sitting down, I want you to take a nitroglycerin. Wait another 3-5 minutes. If it goes away, great. If, however, it does not go away within 3-5 minutes of taking the nitroglycerin, I want you to take a second nitroglycerin and head to the emergency room.  I think you are going to need more of a workup for this chest pain, but I think this is best managed by your cardiologist since you are already established with their practice. Please call his office right away and make an appointment as soon as you can. I'd also like you to take a baby aspirin (81mg ) every day until and unless Dr. Tomie China tells you to stop based on his work up.   Eliezer Mccoy, MD

## 2023-06-16 NOTE — Assessment & Plan Note (Addendum)
EKG today appears stable, nearly identical in morphology when compared to her baseline from 2019.  No ST or T or T wave changes to suggest acute ischemia.  INTERCHEST Score of just 1 is reassuring. Though with her longstanding history of hypertension and obesity, it is certainly possible that she may have some coronary artery disease. Considered CTA coronary, but given that she is already established with Dr. Tomie China, I would defer CAD workup to his office. She thinks she can get an appointment with them rather quickly.  - Adding ASA 81mg  daily until and unless Dr. Kem Parkinson workup is benign - Already on a B-blocker - Given SL nitroglycerin with instructions to present to ED if CP does not resolve with 2 films - ER precautions reviewed

## 2023-06-17 ENCOUNTER — Emergency Department (HOSPITAL_COMMUNITY)
Admission: EM | Admit: 2023-06-17 | Discharge: 2023-06-18 | Disposition: A | Discharge status: Home / Self Care | Payer: BC Managed Care – PPO | Attending: Emergency Medicine | Admitting: Emergency Medicine

## 2023-06-17 ENCOUNTER — Other Ambulatory Visit: Payer: Self-pay

## 2023-06-17 ENCOUNTER — Ambulatory Visit: Payer: BC Managed Care – PPO | Admitting: Student

## 2023-06-17 ENCOUNTER — Encounter (HOSPITAL_COMMUNITY): Payer: Self-pay

## 2023-06-17 ENCOUNTER — Ambulatory Visit: Payer: BC Managed Care – PPO | Admitting: Cardiology

## 2023-06-17 ENCOUNTER — Ambulatory Visit (HOSPITAL_COMMUNITY)
Admission: RE | Admit: 2023-06-17 | Discharge: 2023-06-17 | Disposition: A | Discharge status: Home / Self Care | Payer: BC Managed Care – PPO | Source: Ambulatory Visit | Attending: Family Medicine

## 2023-06-17 ENCOUNTER — Emergency Department (HOSPITAL_COMMUNITY): Payer: BC Managed Care – PPO

## 2023-06-17 DIAGNOSIS — R079 Chest pain, unspecified: Secondary | ICD-10-CM | POA: Insufficient documentation

## 2023-06-17 DIAGNOSIS — Z7982 Long term (current) use of aspirin: Secondary | ICD-10-CM | POA: Insufficient documentation

## 2023-06-17 DIAGNOSIS — I10 Essential (primary) hypertension: Secondary | ICD-10-CM | POA: Diagnosis not present

## 2023-06-17 DIAGNOSIS — R0789 Other chest pain: Secondary | ICD-10-CM | POA: Diagnosis not present

## 2023-06-17 DIAGNOSIS — R42 Dizziness and giddiness: Secondary | ICD-10-CM | POA: Diagnosis not present

## 2023-06-17 LAB — CBC
HCT: 38.6 % (ref 36.0–46.0)
Hemoglobin: 11.7 g/dL — ABNORMAL LOW (ref 12.0–15.0)
MCH: 25.5 pg — ABNORMAL LOW (ref 26.0–34.0)
MCHC: 30.3 g/dL (ref 30.0–36.0)
MCV: 84.3 fL (ref 80.0–100.0)
Platelets: 251 10*3/uL (ref 150–400)
RBC: 4.58 MIL/uL (ref 3.87–5.11)
RDW: 16.5 % — ABNORMAL HIGH (ref 11.5–15.5)
WBC: 6 10*3/uL (ref 4.0–10.5)
nRBC: 0 % (ref 0.0–0.2)

## 2023-06-17 LAB — BASIC METABOLIC PANEL
Anion gap: 10 (ref 5–15)
BUN: 9 mg/dL (ref 6–20)
CO2: 24 mmol/L (ref 22–32)
Calcium: 9.3 mg/dL (ref 8.9–10.3)
Chloride: 103 mmol/L (ref 98–111)
Creatinine, Ser: 0.64 mg/dL (ref 0.44–1.00)
GFR, Estimated: >60 mL/min (ref >60)
Glucose, Bld: 95 mg/dL (ref 70–99)
Potassium: 3.7 mmol/L (ref 3.5–5.1)
Sodium: 137 mmol/L (ref 135–145)

## 2023-06-17 LAB — TROPONIN I (HIGH SENSITIVITY)
Troponin I (High Sensitivity): 5 ng/L (ref <18)
Troponin I (High Sensitivity): 6 ng/L (ref <18)

## 2023-06-17 LAB — HCG, SERUM, QUALITATIVE: Preg, Serum: NEGATIVE

## 2023-06-17 NOTE — ED Triage Notes (Signed)
Pt c/o int midsternal chest pain, int [id number]. Pt denies N/V.

## 2023-06-17 NOTE — Progress Notes (Deleted)
  Cardiology Office Note:  .   Date:  06/17/2023  ID:  Adrienne Collins, DOB [birth year], MRN [id number] PCP: Latrelle Dodrill, MD  [facility] Providers Cardiologist:  None { Click to update primary MD,subspecialty MD or APP then REFRESH:1}   History of Present Illness: .   Adrienne Collins is a 52 y.o. female with a past medical history of hypertension, OSA, diastolic dysfunction, obesity.  08/28/2022 echo EF 70 to 75%, grade 2 DD, mild to moderate TR 12/29/2017 monitor average heart rate 80 bpm, brief episode of ventricular trigeminy occasional PVCs 12/29/2017 stress echo negative for ischemia  Most recently evaluated by Dr. Tomie China on 07/24/2022, she had episodes of chest tightness, decision was made to get a calcium score however it does not appear this was completed.  She was evaluated by her PCP on 06/17/2023 with complaints of intermittent chest pain, pain would last for few minutes and then pass, described as squeezing in the upper portion of her chest.  Her EKG was without changes, she was advised to follow back up with cardiology.  LDL 101 02/11/2023  ROS: ***  Studies Reviewed: .        *** Risk Assessment/Calculations:   {Does this patient have ATRIAL FIBRILLATION?:[phone]}         Physical Exam:   VS:  There were no vitals taken for this visit.   Wt Readings from Last 3 Encounters:  06/16/23 206 lb 9.6 oz (93.7 kg)  02/10/23 197 lb (89.4 kg)  12/11/22 191 lb 12.8 oz (87 kg)    GEN: Well nourished, well developed in no acute distress NECK: No JVD; No carotid bruits CARDIAC: ***RRR, no murmurs, rubs, gallops RESPIRATORY:  Clear to auscultation without rales, wheezing or rhonchi  ABDOMEN: Soft, non-tender, non-distended EXTREMITIES:  No edema; No deformity   ASSESSMENT AND PLAN: .   ***    {Are you ordering a CV Procedure (e.g. stress test, cath, DCCV, TEE, etc)?   Press F2        :[id number]}  Dispo: ***  Signed, Flossie Dibble, NP

## 2023-06-18 NOTE — Progress Notes (Deleted)
Cardiology Office Note:  .   Date:  06/18/2023  ID:  Adrienne Collins, DOB Aug 02, 1971, MRN [id number] PCP: Latrelle Dodrill, MD  [facility] Providers Cardiologist:  None { Click to update primary MD,subspecialty MD or APP then REFRESH:1}   History of Present Illness: .   Adrienne Collins is a 52 y.o. female with a past medical history of hypertension, OSA, diastolic dysfunction, obesity.  08/28/2022 echo EF 70 to 75%, grade 2 DD, mild to moderate TR 12/29/2017 monitor average heart rate 80 bpm, brief episode of ventricular trigeminy occasional PVCs 12/29/2017 stress echo negative for ischemia  Most recently evaluated by Dr. Tomie China on 07/24/2022, she had episodes of chest tightness, decision was made to get a calcium score however it does not appear this was completed.  She was evaluated by her PCP on 06/17/2023 with complaints of intermittent chest pain, pain would last for few minutes and then pass, described as squeezing in the upper portion of her chest.  Her EKG was without changes, she was advised to follow back up with cardiology.  She eventually presented to the emergency department on 06/17/2023 for evaluation of chest pain, EKG was without changes, troponin was negative x 2, chest x-ray was negative, she was ultimately discharged.  LDL 101 02/11/2023 06/17/2023 137, 3.7, 0.64 CTA  ROS: ROS   Studies Reviewed: .        Cardiac Studies & Procedures     STRESS TESTS  ECHOCARDIOGRAM STRESS TEST 12/29/2017  Narrative *[facility]* [address] [place], [place] [address] [phone]  ------------------------------------------------------------------- Stress Echocardiography  Patient:    Adrienne, Collins MR #:       [id number] Study Date: 12/29/2017 Gender:     F Age:        47 Height:     157.5 cm Weight:     92.7 kg BSA:        2.06 m^2 Pt. Status: Room:  ATTENDING    Belva Crome, MD ORDERING     Belva Crome, MD REFERRING    Belva Crome, MD PERFORMING   [facility]    [place], [place] J SONOGRAPHER  Sinda Du, RDCS  cc:  -------------------------------------------------------------------  ------------------------------------------------------------------- Indications:      Palpitations 785.1.  ------------------------------------------------------------------- Study Conclusions  - Stress ECG conclusions: There were no stress arrhythmias or conduction abnormalities. The stress ECG was negative for ischemia. - Staged echo: There was no echocardiographic evidence for stress-induced ischemia.  Impressions:  - Normal exercise tolerance. No chest pain during the exercise. No echo or EKG criteria for ischemia noted Overall it is NEGATIVE stress test for exercise induced myocardial ischemia.  ------------------------------------------------------------------- Study data:   Study status:  Routine.  Consent:  The risks, benefits, and alternatives to the procedure were explained to the patient and informed consent was obtained.  Procedure:  Initial setup. The patient was brought to the laboratory. A baseline ECG was recorded. Surface ECG leads and automatic cuff blood pressure measurements were monitored. Treadmill exercise testing was performed using the Bruce protocol. The patient exercised for 6.13 min, to protocol stage 3, to a maximal work rate of 7.5 mets. Exercise was terminated due to achievement of target heart rate. Transthoracic stress echocardiography. Images were captured at baseline and peak exercise.  Study completion:  The patient tolerated the procedure well. There were no complications. Bruce protocol. Stress echocardiography.  Birthdate:  Patient birthdate: Aug 08, 1971.  Age:  Patient is 52 yr  old.  Sex:  Gender: female.    BMI: 37.4 kg/m^2.  Blood pressure:     132/82  Patient status:  Outpatient.  Study date:  Study date: 12/29/2017. Study time: 01:14  PM.  -------------------------------------------------------------------  ------------------------------------------------------------------- Baseline ECG:  Normal.  ------------------------------------------------------------------- Stress protocol:  +---------------------+---+------------+--------+ !Stage                !HR !BP (mmHg)   !Symptoms! +---------------------+---+------------+--------+ !Baseline             !75 !132/82 (99) !None    ! +---------------------+---+------------+--------+ !Stage 1              !95 !135/69 (91) !--------! +---------------------+---+------------+--------+ !Stage 2              !96 !------------!--------! +---------------------+---+------------+--------+ !Stage 3              !146!133/52 (79) !--------! +---------------------+---+------------+--------+ !Stage 4              !164!------------!--------! +---------------------+---+------------+--------+ !Immediate post stress!162!------------!--------! +---------------------+---+------------+--------+ !Recovery; 1 min      !150!------------!--------! +---------------------+---+------------+--------+ !Recovery; 2 min      !133!242/57 (119)!--------! +---------------------+---+------------+--------+ !Recovery; 3 min      !127!242/57 (119)!--------! +---------------------+---+------------+--------+ !Recovery; 5 min      !104!145/46 (79) !--------! +---------------------+---+------------+--------+ !Recovery; 10 min     !96 !------------!--------! +---------------------+---+------------+--------+  ------------------------------------------------------------------- Stress results:   Maximal heart rate during stress was 164 bpm (94% of maximal predicted heart rate). The maximal predicted heart rate was 174 bpm.The target heart rate was achieved. The heart rate response to stress was normal. There was a normal resting blood pressure with an appropriate response to stress. The rate-pressure product  for the peak heart rate and blood pressure was 91478 mm Hg/min.  The patient experienced no chest pain during stress.  ------------------------------------------------------------------- Stress ECG:  There were no stress arrhythmias or conduction abnormalities.  The stress ECG was negative for ischemia.  ------------------------------------------------------------------- Baseline:  - The estimated LV ejection fraction was 55-60%. - Normal wall motion; no LV regional wall motion abnormalities.  Peak stress:  - The estimated LV ejection fraction was 60-65%. - Normal wall motion; no LV regional wall motion abnormalities.  ------------------------------------------------------------------- Stress echo results:     Left ventricular ejection fraction was normal at rest and with stress. There was no echocardiographic evidence for stress-induced ischemia.  ------------------------------------------------------------------- Prepared and Electronically Authenticated by  Gypsy Balsam, MD 2019-05-21T11:56:36   ECHOCARDIOGRAM  ECHOCARDIOGRAM COMPLETE 08/20/2022  Narrative ECHOCARDIOGRAM REPORT    Patient Name:   NANNIE STARZYK Date of Exam: 08/20/2022 Medical Rec #:  [id number]       Height:       61.0 in Accession #:    [id number]      Weight:       193.1 lb Date of Birth:  [birth year]        BSA:          1.861 m Patient Age:    52 years        BP:           130/60 mmHg Patient Gender: F               HR:           62 bpm. Exam Location:  [address]  Procedure: 2D Echo, 3D Echo, Cardiac Doppler, Color Doppler and Strain Analysis  Indications:    R01.1 Murmur  History:        Patient has prior history of Echocardiogram examinations, most  recent 12/26/2017. Signs/Symptoms:Murmur, Shortness of Breath and Fatigue; Risk Factors:Hypertension, Family History of Coronary Artery Disease, Sleep Apnea and Former Smoker. Palpitaitons, obesity.  Sonographer:    Farrel Conners  RDCS Referring Phys: Garwin Brothers  IMPRESSIONS   1. Left ventricular ejection fraction, by estimation, is 70 to 75%. Left ventricular ejection fraction by 3D volume is 72 %. The left ventricle has hyperdynamic function. The left ventricle has no regional wall motion abnormalities. Left ventricular diastolic parameters are consistent with Grade II diastolic dysfunction (pseudonormalization). The average left ventricular global longitudinal strain is -23.9 %. The global longitudinal strain is normal. 2. Right ventricular systolic function is normal. The right ventricular size is normal. There is normal pulmonary artery systolic pressure. The estimated right ventricular systolic pressure is 31.9 mmHg. 3. Left atrial size was mildly dilated. 4. The mitral valve is normal in structure. Trivial mitral valve regurgitation. No evidence of mitral stenosis. 5. Tricuspid valve regurgitation is mild to moderate. 6. The aortic valve is tricuspid. Aortic valve regurgitation is not visualized. No aortic stenosis is present. 7. The inferior vena cava is normal in size with greater than 50% respiratory variability, suggesting right atrial pressure of 3 mmHg.  FINDINGS Left Ventricle: Left ventricular ejection fraction, by estimation, is 70 to 75%. Left ventricular ejection fraction by 3D volume is 72 %. The left ventricle has hyperdynamic function. The left ventricle has no regional wall motion abnormalities. The average left ventricular global longitudinal strain is -23.9 %. The global longitudinal strain is normal. The left ventricular internal cavity size was normal in size. There is no left ventricular hypertrophy. Left ventricular diastolic parameters are consistent with Grade II diastolic dysfunction (pseudonormalization).  Right Ventricle: The right ventricular size is normal. No increase in right ventricular wall thickness. Right ventricular systolic function is normal. There is normal pulmonary  artery systolic pressure. The tricuspid regurgitant velocity is 2.69 m/s, and with an assumed right atrial pressure of 3 mmHg, the estimated right ventricular systolic pressure is 31.9 mmHg.  Left Atrium: Left atrial size was mildly dilated.  Right Atrium: Right atrial size was normal in size.  Pericardium: There is no evidence of pericardial effusion.  Mitral Valve: The mitral valve is normal in structure. Trivial mitral valve regurgitation. No evidence of mitral valve stenosis.  Tricuspid Valve: The tricuspid valve is normal in structure. Tricuspid valve regurgitation is mild to moderate. No evidence of tricuspid stenosis.  Aortic Valve: The aortic valve is tricuspid. Aortic valve regurgitation is not visualized. No aortic stenosis is present.  Pulmonic Valve: The pulmonic valve was normal in structure. Pulmonic valve regurgitation is trivial. No evidence of pulmonic stenosis.  Aorta: The aortic root is normal in size and structure.  Venous: The inferior vena cava is normal in size with greater than 50% respiratory variability, suggesting right atrial pressure of 3 mmHg.  IAS/Shunts: No atrial level shunt detected by color flow Doppler.   LEFT VENTRICLE PLAX 2D LVIDd:         5.00 cm         Diastology LVIDs:         2.90 cm         LV e' medial:    6.42 cm/s LV PW:         0.70 cm         LV E/e' medial:  16.9 LV IVS:        0.80 cm         LV e' lateral:  8.27 cm/s LVOT diam:     2.10 cm         LV E/e' lateral: 13.1 LV SV:         85 LV SV Index:   46              2D LVOT Area:     3.46 cm        Longitudinal Strain 2D Strain GLS  -26.1 % (A2C): 2D Strain GLS  -19.9 % (A3C): 2D Strain GLS  -25.7 % (A4C): 2D Strain GLS  -23.9 % Avg:  3D Volume EF LV 3D EF:    Left ventricul ar ejection fraction by 3D volume is 72 %.  3D Volume EF: 3D EF:        72 % LV EDV:       126 ml LV ESV:       35 ml LV SV:        91 ml  RIGHT VENTRICLE RV Basal diam:  3.10  cm RV S prime:     15.00 cm/s TAPSE (M-mode): 2.4 cm  LEFT ATRIUM             Index        RIGHT ATRIUM           Index LA diam:        4.00 cm 2.15 cm/m   RA Area:     16.30 cm LA Vol (A2C):   54.9 ml 29.50 ml/m  RA Volume:   38.70 ml  20.80 ml/m LA Vol (A4C):   48.1 ml 25.85 ml/m LA Biplane Vol: 52.7 ml 28.32 ml/m AORTIC VALVE LVOT Vmax:   121.33 cm/s LVOT Vmean:  75.400 cm/s LVOT VTI:    0.245 m  AORTA Ao Root diam: 2.50 cm Ao Asc diam:  2.60 cm  MITRAL VALVE                TRICUSPID VALVE MV Area (PHT): cm          TR Peak grad:   28.9 mmHg MV Decel Time: 203 msec     TR Vmax:        269.00 cm/s MV E velocity: 108.20 cm/s MV A velocity: 88.00 cm/s   SHUNTS MV E/A ratio:  1.23         Systemic VTI:  0.24 m Systemic Diam: 2.10 cm  Donato Schultz MD Electronically signed by Donato Schultz MD Signature Date/Time: 08/28/2022/9:51:44 AM    Final             Risk Assessment/Calculations:             Physical Exam:   VS:  There were no vitals taken for this visit.   Wt Readings from Last 3 Encounters:  06/17/23 206 lb 9.1 oz (93.7 kg)  06/16/23 206 lb 9.6 oz (93.7 kg)  02/10/23 197 lb (89.4 kg)    GEN: Well nourished, well developed in no acute distress NECK: No JVD; No carotid bruits CARDIAC: ***RRR, no murmurs, rubs, gallops RESPIRATORY:  Clear to auscultation without rales, wheezing or rhonchi  ABDOMEN: Soft, non-tender, non-distended EXTREMITIES:  No edema; No deformity   ASSESSMENT AND PLAN: .   Precordial chest pain- Hypertension- Dyslipidemia- OSA- Obesity-    {Are you ordering a CV Procedure (e.g. stress test, cath, DCCV, TEE, etc)?   Press F2        :[id number]}  Dispo: ***  Signed, Flossie Dibble, NP

## 2023-06-18 NOTE — ED Notes (Signed)
Pt in NAD at d/c from ED. A&O. Ambulatory. Respirations even & unlabored. Skin warm & dry. Pt verbalized understanding of d/c teaching including follow up care and reasons to return to the ED. No needs or questions expressed at d/c.

## 2023-06-18 NOTE — ED Provider Notes (Signed)
EMERGENCY DEPARTMENT AT [facility] Provider Note   CSN: [id number] Arrival date & time: 06/17/23  1817     History  Chief Complaint  Patient presents with   Chest Pain    Adrienne Collins is a 52 y.o. female.  The history is provided by the patient and medical records.  Chest Pain  52 year old female with history of anemia, hypertension, obesity, prediabetes, LVH, sleep apnea, presenting to the ED with chest pain.  This has been intermittent for the past several days.  Onset seems random, not necessarily associated with exertion.  She reports sometimes it feels like "congestion" in tends to improve with coughing or clearing her throat.  No fever/chills.  No real SOB, palpitations, dizziness, weakness, or feelings of syncope.  She follows with cardiology, Dr. Tomie China, and has appointment with him in 2 days.  She smoked briefly as a teenager but no longer.  She denies any drug use.  No exogenous estrogens.  No history of DVT or PE.  Saw PCP yesterday with normal EKG but she desired blood work for further evaluation and extra reassurance.  Home Medications Prior to Admission medications   Medication Sig Start Date End Date Taking? Authorizing Provider  acetaminophen (TYLENOL) 650 MG CR tablet Take 2 tablets (1,300 mg total) by mouth every 8 (eight) hours as needed for pain. take 2 tabs every 6 hours as needed for pain 10/24/10   Zachery Dauer, MD  aspirin EC 81 MG tablet Take 1 tablet (81 mg total) by mouth daily. Swallow whole. 06/16/23   Alicia Amel, MD  Biotin 1000 MCG CHEW Chew 1,000 mg by mouth daily.    [provider]  cetirizine (ZYRTEC ALLERGY) 10 MG tablet Take 1 tablet (10 mg total) by mouth daily. 10/30/21   Autry-Lott, Randa Evens, DO  clobetasol (TEMOVATE) 0.05 % external solution Apply 1 Application topically daily as needed.    [provider]  desonide (DESOWEN) 0.05 % cream Apply 1 Application topically 2 (two) times daily.    [provider]  diclofenac Sodium (VOLTAREN) 1 % GEL Apply 2 g topically daily as needed (For shoulder pain).    [provider]  Enalapril-hydroCHLOROthiazide 5-12.5 MG tablet TAKE 1 TABLET BY MOUTH DAILY 03/03/23   Latrelle Dodrill, MD  fluticasone [facility]) 50 MCG/ACT nasal spray Place 2 sprays into both nostrils daily. Patient taking differently: Place 2 sprays into both nostrils daily as needed. 04/02/22   Latrelle Dodrill, MD  ibuprofen (ADVIL) 200 MG tablet Take 600 mg by mouth every 6 (six) hours as needed for mild pain or moderate pain.    [provider]  metoprolol succinate (TOPROL-XL) 50 MG 24 hr tablet TAKE 1 TABLET BY MOUTH IN THE  EVENING 03/03/23   Latrelle Dodrill, MD  nitroGLYCERIN (NITROSTAT) 0.3 MG SL tablet Place 1 tablet (0.3 mg total) under the tongue every 5 (five) minutes as needed for chest pain. 06/16/23   Alicia Amel, MD  Zoster Vaccine Adjuvanted [facility]) injection Administer Shingrix vaccination now and repeat in two months 02/10/23   Latrelle Dodrill, MD      Allergies    Patient has no known allergies.    Review of Systems   Review of Systems  Cardiovascular:  Positive for chest pain.  All other systems reviewed and are negative.   Physical Exam Updated Vital Signs BP 137/80 (BP Location: Left Arm)   Pulse (!) 55   Temp 98 F (36.7  C)   Resp (!) 24   Ht 5\' 1"  (1.549 m)   Wt 93.7 kg   SpO2 100%   BMI 39.03 kg/m   Physical Exam Vitals and nursing note reviewed.  Constitutional:      Appearance: She is well-developed.  HENT:     Head: Normocephalic and atraumatic.  Eyes:     Conjunctiva/sclera: Conjunctivae normal.     Pupils: Pupils are equal, round, and reactive to light.  Cardiovascular:     Rate and Rhythm: Normal rate and regular rhythm.     Heart sounds: Normal heart sounds.  Pulmonary:     Effort: Pulmonary effort is normal.     Breath sounds: Normal breath sounds.  Abdominal:     General: Bowel  sounds are normal.     Palpations: Abdomen is soft.  Musculoskeletal:        General: Normal range of motion.     Cervical back: Normal range of motion.  Skin:    General: Skin is warm and dry.  Neurological:     Mental Status: She is alert and oriented to person, place, and time.     ED Results / Procedures / Treatments   Labs (all labs ordered are listed, but only abnormal results are displayed) Labs Reviewed  CBC - Abnormal; Notable for the following components:      Result Value   Hemoglobin 11.7 (*)    MCH 25.5 (*)    RDW 16.5 (*)    All other components within normal limits  BASIC METABOLIC PANEL  HCG, SERUM, QUALITATIVE  TROPONIN I (HIGH SENSITIVITY)  TROPONIN I (HIGH SENSITIVITY)    EKG EKG Interpretation Date/Time:  Tuesday June 17 2023 18:41:35 EST Ventricular Rate:  65 PR Interval:  154 QRS Duration:  78 QT Interval:  408 QTC Calculation: 424 R Axis:   12  Text Interpretation: Normal sinus rhythm Normal ECG When compared with ECG of 16-Jun-2023 17:30, PREVIOUS ECG IS PRESENT Confirmed by Ross Marcus ([id number]) on 06/18/2023 12:59:55 AM  Radiology DG Chest 2 View  Result Date: 06/17/2023 CLINICAL DATA:  Chest pain, lightheadedness EXAM: CHEST - 2 VIEW COMPARISON:  11/01/2014 FINDINGS: Lungs are clear. Heart size and mediastinal contours are within normal limits. No effusion. Visualized bones unremarkable. IMPRESSION: No acute cardiopulmonary disease. Electronically Signed   By: Corlis Leak M.D.   On: 06/17/2023 21:12    Procedures Procedures    Medications Ordered in ED Medications - No data to display  ED Course/ Medical Decision Making/ A&P                                 Medical Decision Making Amount and/or Complexity of Data Reviewed Labs: ordered. Radiology: ordered and independent interpretation performed. ECG/medicine tests: ordered and independent interpretation performed.   52 y.o. female presenting to the ED with chest pain.  Has  been intermittent for the past several days, seems rather random in onset.  She is not having any real associated shortness of breath, diaphoresis, palpitations, or feelings of syncope.  She is afebrile and nontoxic in appearance here.  Her EKG is sinus rhythm without acute ischemic changes, unchanged from prior.  Labs are reassuring--does have mild anemia which is chronic, largely unchanged from prior.  No electrolyte derangement.  Troponin negative x 2.  Chest x-ray is clear.  Patient symptoms are a bit atypical, no real exertional component.  Lower suspicion for ACS at  this time.  She does not have any significant risk factors for PE, no tachycardia or hypoxia to suggest same.  Feel she is stable for discharge.  She has follow-up with cardiology in 2 days.  She can return here for any new or acute changes.  Final Clinical Impression(s) / ED Diagnoses Final diagnoses:  Chest pain in adult    Rx / DC Orders ED Discharge Orders     None         Garlon Hatchet, PA-C 06/18/23 0112    Shon Baton, MD 06/18/23 2255

## 2023-06-18 NOTE — Discharge Instructions (Signed)
As we discussed, cardiac workup today was reassuring. Please follow-up with cardiology on Thursday as scheduled-- they will have access to your labs/chart from today's visit. Can follow-up with your primary care doctor otherwise. Turn here for any new or acute changes.

## 2023-06-19 ENCOUNTER — Ambulatory Visit: Payer: BC Managed Care – PPO | Attending: Cardiology | Admitting: Cardiology

## 2023-06-19 DIAGNOSIS — R072 Precordial pain: Secondary | ICD-10-CM

## 2023-06-19 DIAGNOSIS — G4733 Obstructive sleep apnea (adult) (pediatric): Secondary | ICD-10-CM

## 2023-06-19 DIAGNOSIS — I10 Essential (primary) hypertension: Secondary | ICD-10-CM

## 2023-06-19 DIAGNOSIS — I5189 Other ill-defined heart diseases: Secondary | ICD-10-CM

## 2023-07-03 DIAGNOSIS — Z0289 Encounter for other administrative examinations: Secondary | ICD-10-CM

## 2023-07-04 ENCOUNTER — Ambulatory Visit: Payer: BC Managed Care – PPO

## 2023-07-07 ENCOUNTER — Telehealth: Payer: Self-pay

## 2023-07-07 NOTE — Telephone Encounter (Signed)
Transition Care Management Unsuccessful Follow-up Telephone Call  Date of discharge and from where:  [facility] 11/6  Attempts:  1st Attempt  Reason for unsuccessful TCM follow-up call:  No answer/busy   Lenard Forth [place]  [facility], [facility] Guide, Phone: [phone] Website: [email or web address]

## 2023-07-08 ENCOUNTER — Telehealth: Payer: Self-pay

## 2023-07-08 NOTE — Telephone Encounter (Signed)
Transition Care Management Unsuccessful Follow-up Telephone Call  Date of discharge and from where:  [facility] 11/[facility]  Attempts:  2nd Attempt  Reason for unsuccessful TCM follow-up call:  No answer/busy   Lenard Forth [place]  [facility], [facility] Guide, Phone: [phone] Website: [email or web address]

## 2023-07-14 ENCOUNTER — Telehealth: Payer: Self-pay | Admitting: Psychiatry

## 2023-07-14 NOTE — Telephone Encounter (Signed)
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  Pt states it now affects her quality of life and her sleep. Pt states even in just sitting  there is worsening pain in shoulder and neck.  Pt sttes her hands feel dead,  Pt has accepted next available appointment and is on wait list with suggested providers by New Pt referrals(Dr Terrace Arabia Dr Marjory Lies and Dr Epimenio Foot) this is fyi for POD 1

## 2023-09-22 ENCOUNTER — Encounter: Payer: BC Managed Care – PPO | Admitting: Family Medicine

## 2023-09-25 ENCOUNTER — Other Ambulatory Visit: Payer: Self-pay

## 2023-09-26 ENCOUNTER — Ambulatory Visit: Payer: BC Managed Care – PPO | Attending: Cardiology | Admitting: Cardiology

## 2023-09-29 ENCOUNTER — Encounter: Payer: Self-pay | Admitting: Family Medicine

## 2023-09-29 ENCOUNTER — Ambulatory Visit: Payer: BC Managed Care – PPO | Admitting: Family Medicine

## 2023-09-29 VITALS — BP 126/66 | HR 65 | Wt 198.0 lb

## 2023-09-29 DIAGNOSIS — R42 Dizziness and giddiness: Secondary | ICD-10-CM

## 2023-09-29 NOTE — Patient Instructions (Signed)
I'll let you know if anything is abnormal today

## 2023-09-29 NOTE — Progress Notes (Addendum)
    SUBJECTIVE:   CHIEF COMPLAINT / HPI:   Dizziness when she got up this morning - room spinning Occurred when she got out of bed in the morning 0700 Improved when she sat back down in the bed, rested for 30 mins Also felt dizzy when she lay back initially and turned her head to the left Lasted about 30 mins Also had a bit of a headache last night and this morning - endorses history of headaches for a few years No numbness/weakness in extremities No blurry vision No palpitations, chest pain, shortness of breath   Currently still has a bit of a headache but otherwise no symptoms No recent illness Eating/drinking normally  PERTINENT  PMH / PSH: Anemia (was previously on iron supplementation but has not been taking this), hypertension  OBJECTIVE:   BP 126/66   Pulse 65   Wt 198 lb (89.8 kg)   SpO2 98%   BMI 37.41 kg/m   General: NAD, pleasant, able to participate in exam Cardiac: RRR, no murmurs auscultated Respiratory: CTAB, normal WOB Abdomen: soft, non-tender, non-distended, normoactive bowel sounds Extremities: warm and well perfused, no edema or cyanosis Skin: warm and dry, no rashes noted Neuro: alert, no obvious focal deficits, speech normal.  Cranial nerves II through XII intact without any focal deficits.  Finger-nose testing normal.  Gait normal.  Sensation intact in bilateral upper and lower extremities.  Strength 5/5 in bilateral upper and lower extremities. Psych: Normal affect and mood  ASSESSMENT/PLAN:   Assessment & Plan Dizziness 30-minute episode earlier today, self resolved after rest.  Orthostatics are negative in clinic today.  Reassuringly normal neuro exam and no symptoms suggestive of cardiac etiology.  No murmurs on exam.  No history of stroke.  Reviewed brain MRI from 2023 which showed some nonspecific findings but no acute abnormalities.  Low suspicion for TIA/CVA.  Favor episode of possible BPPV given association with positioning.  Check CBC and  BMP today to evaluate for anemia (patient has history of this and has not been taking iron supplementation), electrolyte abnormalities.  Discussed strict return precautions and red flags.   Vonna Drafts, MD [facility]

## 2023-09-30 ENCOUNTER — Encounter: Payer: Self-pay | Admitting: Family Medicine

## 2023-09-30 LAB — BASIC METABOLIC PANEL
BUN/Creatinine Ratio: 20 (ref 9–23)
BUN: 12 mg/dL (ref 6–24)
CO2: 23 mmol/L (ref 20–29)
Calcium: 9.9 mg/dL (ref 8.7–10.2)
Chloride: 101 mmol/L (ref 96–106)
Creatinine, Ser: 0.59 mg/dL (ref 0.57–1.00)
Glucose: 97 mg/dL (ref 70–99)
Potassium: 4.4 mmol/L (ref 3.5–5.2)
Sodium: 139 mmol/L (ref 134–144)
eGFR: 108 mL/min/{1.73_m2} (ref >59)

## 2023-09-30 LAB — CBC
Hematocrit: 38.5 % (ref 34.0–46.6)
Hemoglobin: 11.9 g/dL (ref 11.1–15.9)
MCH: 25.2 pg — ABNORMAL LOW (ref 26.6–33.0)
MCHC: 30.9 g/dL — ABNORMAL LOW (ref 31.5–35.7)
MCV: 81 fL (ref 79–97)
Platelets: 274 10*3/uL (ref 150–450)
RBC: 4.73 x10E6/uL (ref 3.77–5.28)
RDW: 14.8 % (ref 11.7–15.4)
WBC: 6.2 10*3/uL (ref 3.4–10.8)

## 2023-10-20 ENCOUNTER — Encounter: Payer: Self-pay | Admitting: Neurology

## 2023-10-20 ENCOUNTER — Ambulatory Visit: Payer: BC Managed Care – PPO | Admitting: Neurology

## 2023-10-20 VITALS — BP 131/83 | HR 67 | Ht 61.0 in | Wt 202.0 lb

## 2023-10-20 DIAGNOSIS — R519 Headache, unspecified: Secondary | ICD-10-CM | POA: Insufficient documentation

## 2023-10-20 DIAGNOSIS — G43709 Chronic migraine without aura, not intractable, without status migrainosus: Secondary | ICD-10-CM | POA: Diagnosis not present

## 2023-10-20 DIAGNOSIS — I679 Cerebrovascular disease, unspecified: Secondary | ICD-10-CM | POA: Insufficient documentation

## 2023-10-20 NOTE — Progress Notes (Signed)
 Chief Complaint  Patient presents with   Follow-up    Pt in 15, here alone  Dr Delena Bali pt, pt is following up on headaches. Pt states she has been having headaches more often, states takes ibuprofen. Pt states that Dr Delena Bali order an MRI, which showed a pinched nerve, which she was going to be sent to do PT and she never heard back.       ASSESSMENT AND PLAN  CHELLSIE GOMER is a 53 y.o. female   Cerebral vascular disease Chronic migraine At risk for obstructive sleep apnea  She has vascular risk factors of obesity, hypertension, strong family history of cerebrovascular disease,  MRI of the brain in June 2023 showed scattered small vessel disease more than age peers,  Advised her aspirin 81 mg daily, increase water intake, weight control, moderate exercise  Refer her to sleep study   DIAGNOSTIC DATA (LABS, IMAGING, TESTING) - I reviewed patient records, labs, notes, testing and imaging myself where available.   MEDICAL HISTORY:  THAYER INABINET, is a 53 year old female seen in request by her primary care doctor Levert Feinstein for evaluation of chronic migraine, dizziness, initial evaluation was on October 20, 2023   History is obtained from the patient and review of electronic medical records. I personally reviewed pertinent available imaging films in PACS.   PMHx of  HTN  She had a history of migraine, typical migraine holoacranial severe pounding headache with throbbing, light noise sensitivity or nausea, was seen by Dr. Delena Bali, had MRI of the brain with without contrast in June 2023 no significant abnormality MRI of cervical spine mild degenerative changes no evidence of canal foraminal narrowing  Over the past 1 year she has headache about twice a week, taking ibuprofen as needed, which is helpful  About a week ago she woke up 1 morning felt dizziness, lightheaded, barely make it to the bathroom, had to lie down, do have some headache with it, to now, she is still feeling  woozy headed sometimes, denies vertigo  She was diagnosed with mild obstructive sleep apnea few years back, no treatment was offered, she now complains of mild snoring, who has suffered obesity, daytime sleepiness, has narrowing pharyngeal space  She denies visual change, was seen by ophthalmologist few weeks ago, no abnormality found, wearing glasses  PHYSICAL EXAM:   Vitals:   10/20/23 1016 10/20/23 1026  BP: (!) 149/79 131/83  Pulse: 88 67  Weight: 202 lb (91.6 kg)   Height: 5\' 1"  (1.549 m)    Not recorded     Body mass index is 38.17 kg/m.  PHYSICAL EXAMNIATION:  Gen: NAD, conversant, well nourised, well groomed                     Cardiovascular: Regular rate rhythm, no peripheral edema, warm, nontender. Eyes: Conjunctivae clear without exudates or hemorrhage Neck: Supple, no carotid bruits. Pulmonary: Clear to auscultation bilaterally   NEUROLOGICAL EXAM:  MENTAL STATUS: Speech/cognition: Awake, alert, oriented to history taking and casual conversation CRANIAL NERVES: CN II: Visual fields are full to confrontation. Pupils are round equal and briskly reactive to light.  Funduscopy was normal CN III, IV, VI: extraocular movement are normal. No ptosis. CN V: Facial sensation is intact to light touch CN VII: Face is symmetric with normal eye closure  CN VIII: Hearing is normal to causal conversation. CN IX, X: Phonation is normal. CN XI: Head turning and shoulder shrug are intact CN XII: Narrow oropharyngeal space  MOTOR: There is no pronator drift of out-stretched arms. Muscle bulk and tone are normal. Muscle strength is normal.  REFLEXES: Reflexes are 2+ and symmetric at the biceps, triceps, knees, and ankles. Plantar responses are flexor.  SENSORY: Intact to light touch, pinprick and vibratory sensation are intact in fingers and toes.  COORDINATION: There is no trunk or limb dysmetria noted.  GAIT/STANCE: Posture is normal. Gait is steady with normal  steps, base, arm swing, and turning. Heel and toe walking are normal. Tandem gait is normal.  Romberg is absent.  REVIEW OF SYSTEMS:  Full 14 system review of systems performed and notable only for as above All other review of systems were negative.   ALLERGIES: No Known Allergies  HOME MEDICATIONS: Current Outpatient Medications  Medication Sig Dispense Refill   acetaminophen (TYLENOL) 650 MG CR tablet Take 2 tablets (1,300 mg total) by mouth every 8 (eight) hours as needed for pain. take 2 tabs every 6 hours as needed for pain 30 tablet prn   aspirin EC 81 MG tablet Take 1 tablet (81 mg total) by mouth daily. Swallow whole.     Biotin 1000 MCG CHEW Chew 1,000 mg by mouth daily.     cetirizine (ZYRTEC ALLERGY) 10 MG tablet Take 1 tablet (10 mg total) by mouth daily. 90 tablet 0   clobetasol (TEMOVATE) 0.05 % external solution Apply 1 Application topically daily as needed (rash).     desonide (DESOWEN) 0.05 % cream Apply 1 Application topically 2 (two) times daily.     Enalapril-hydroCHLOROthiazide 5-12.5 MG tablet TAKE 1 TABLET BY MOUTH DAILY 90 tablet 3   ibuprofen (ADVIL) 200 MG tablet Take 600 mg by mouth every 6 (six) hours as needed for mild pain or moderate pain.     metoprolol succinate (TOPROL-XL) 50 MG 24 hr tablet TAKE 1 TABLET BY MOUTH IN THE  EVENING 90 tablet 3   diclofenac Sodium (VOLTAREN) 1 % GEL Apply 2 g topically daily as needed (For shoulder pain). (Patient not taking: Reported on 10/20/2023)     fluticasone (FLONASE) 50 MCG/ACT nasal spray Place 2 sprays into both nostrils daily. (Patient not taking: Reported on 10/20/2023) 16 g 6   nitroGLYCERIN (NITROSTAT) 0.3 MG SL tablet Place 1 tablet (0.3 mg total) under the tongue every 5 (five) minutes as needed for chest pain. (Patient not taking: Reported on 10/20/2023) 90 tablet 12   Zoster Vaccine Adjuvanted [facility]) injection Administer Shingrix vaccination now and repeat in two months (Patient not taking: Reported on  10/20/2023) 1 each 1   No current facility-administered medications for this visit.   Facility-Administered Medications Ordered in Other Visits  Medication Dose Route Frequency Provider Last Rate Last Admin   acetaminophen (TYLENOL) tablet 650 mg  650 mg Oral Once Ajewole, Christana, MD       diphenhydrAMINE (BENADRYL) capsule 25 mg  25 mg Oral Once Ajewole, Christana, MD       iron sucrose (VENOFER) 200 mg in sodium chloride 0.9 % 100 mL IVPB  200 mg Intravenous Once Lorriane Shire, MD        PAST MEDICAL HISTORY: Past Medical History:  Diagnosis Date   Abnormal screening mammogram 07/01/2013   01/2013   Abnormal vaginal bleeding 01/17/2022   Anemia    Anemia, iron deficiency 11/18/2006   Qualifier: History of  By: Sheffield Slider MD, Wayne     Arthritis    Breast pain, left 11/28/2014   Carpal tunnel syndrome 07/20/2014   Cervical cancer screening 06/14/2020  CERVICAL RADICULOPATHY, RIGHT 04/26/2008   Qualifier: Diagnosis of   By: Sheffield Slider MD, Wayne      Replacing diagnoses that were inactivated after the 11/11/22 regulatory import     Cervicalgia 12/19/2021   Chest tightness 02/11/2012   At rest   Chronic right hip pain 01/11/2016   Colon polyps    Constipation 11/02/2014   Fatigue 06/02/2015   Fibroid uterus 04/26/2021   Generalized joint pain 01/27/2007   Acetaminophen for daily pain, Short course of Ibuprofen for more severe flare.     History of colonic polyps 06/07/2016   Colonoscopy 2010 Hepatic flexure sessile polyp measuring 1.0 cm Transverse colon sessile polyp measuring 4 mm   History of ETT 08/2006   no sustained tachycardia   Hypertension    Left arm pain 11/28/2014   Left knee pain 01/10/2017   Left shoulder pain 01/11/2016   LVH (left ventricular hypertrophy) 06/07/2016   EKG 06/07/16   Memory change 09/05/2021   Menorrhagia 04/21/2012   Morbid obesity (HCC) 10/03/2014   2015  BMI 36 2017  BMI 37  05/2016 ASCVD 2.4%   Neuropathy 11/25/2017   OSA (obstructive  sleep apnea) 03/20/2016   Sleep study 07/2015 showed mild OSA. No CPAP recommended.    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   Unspecified vitamin D deficiency 12/09/2012   Level 23 on 12/08/12 and Vitamin D3 1000-2000 IU daily was recommended   Uterine leiomyoma 11/22/2021   Vitamin D deficiency 12/09/2012   Level 23 on 12/08/12 and Vitamin D3 1000-2000 IU daily was recommended  IMO SNOMED Dx Update Oct 2024      PAST SURGICAL HISTORY: Past Surgical History:  Procedure Laterality Date   CESAREAN SECTION  1990   CESAREAN SECTION  1991   IR ANGIOGRAM PELVIS SELECTIVE OR SUPRASELECTIVE  11/22/2021   IR ANGIOGRAM SELECTIVE EACH ADDITIONAL VESSEL  11/22/2021   IR ANGIOGRAM SELECTIVE EACH ADDITIONAL VESSEL  11/22/2021   IR EMBO TUMOR ORGAN ISCHEMIA INFARCT INC GUIDE ROADMAPPING  11/22/2021   IR RADIOLOGIST EVAL & MGMT  08/14/2021   IR RADIOLOGIST EVAL & MGMT  12/20/2021   IR US GUIDE VASC ACCESS LEFT  11/22/2021   RADIOLOGY WITH ANESTHESIA N/A 01/29/2022   Procedure: CERVICAL SPINE WITHOUT CONTRAST,BRAIN WITH AND WITHOUT CONTRAST;  Surgeon: Radiologist, Medication, MD;  Location: [facility] OR;  Service: Radiology;  Laterality: N/A;   SALPINGECTOMY Right     FAMILY HISTORY: Family History  Problem Relation Age of Onset   Glaucoma Mother    Diabetes Mother    Hypertension Mother    Heart failure Mother    Kidney disease Mother    Stroke Sister        drug abuse   Diabetes Brother    Kidney failure Brother    Lung cancer Maternal Grandfather        smoker    SOCIAL HISTORY: Social History   Socioeconomic History   Marital status: Single    Spouse name: Not on file   Number of children: 2   Years of education: 66 GED   Highest education level: Not on file  Occupational History    Occupation: Activity assistant    Employer: [employer]  Tobacco Use   Smoking status: Former    Current packs/day: 0.00  Types: Cigarettes    Quit date: 01/30/1994    Years since quitting: 29.7    Passive exposure: Past   Smokeless tobacco: Never  Substance and Sexual Activity   Alcohol use: No    Comment: quit 1995   Drug use: No   Sexual activity: Not Currently  Other Topics Concern   Not on file  Social History Narrative   Never married, celebate since 64Son, Cedric, odd jobs, living at [employer], Education officer, community, Consulting civil engineer at [employer], living at [employer] since 01/2012, 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 [employer]   Right handed    Caffeine- 1-2 cups per day    Social Drivers of Health   Financial Resource Strain: Not on file  Food Insecurity: No Food Insecurity (04/26/2021)   Hunger Vital Sign    Worried About Running Out of Food in the Last Year: Never true    Ran Out of Food in the Last Year: Never true  Transportation Needs: No Transportation Needs (04/26/2021)   PRAPARE - Administrator, Civil Service (Medical): No    Lack of Transportation (Non-Medical): No  Physical Activity: Not on file  Stress: Not on file  Social Connections: Unknown (12/13/2021)   Received from [facility]   Social Network    Social Network: Not on file  Intimate Partner Violence: Unknown (11/13/2021)   Received from [facility]   HITS    Physically Hurt: Not on file    Insult or Talk Down To: Not on file    Threaten Physical Harm: Not on file    Scream or Curse: Not on file      Levert Feinstein, M.D. Ph.D.  [facility] [address], [address] [place], [place] [address] Ph: [phone] Fax: [phone]  CC:  Latrelle Dodrill, MD [address] [place],  [place] [address]  Latrelle Dodrill, MD

## 2023-11-05 ENCOUNTER — Institutional Professional Consult (permissible substitution): Admitting: Neurology

## 2023-11-05 ENCOUNTER — Encounter: Payer: Self-pay | Admitting: Neurology

## 2024-01-02 ENCOUNTER — Encounter (HOSPITAL_COMMUNITY): Payer: Self-pay | Admitting: *Deleted

## 2024-01-02 ENCOUNTER — Ambulatory Visit (HOSPITAL_COMMUNITY)
Admission: EM | Admit: 2024-01-02 | Discharge: 2024-01-02 | Disposition: A | Discharge status: Home / Self Care | Attending: Emergency Medicine | Admitting: Emergency Medicine

## 2024-01-02 DIAGNOSIS — M545 Low back pain, unspecified: Secondary | ICD-10-CM | POA: Diagnosis not present

## 2024-01-02 DIAGNOSIS — W19XXXA Unspecified fall, initial encounter: Secondary | ICD-10-CM | POA: Diagnosis not present

## 2024-01-02 LAB — POCT URINALYSIS DIP (MANUAL ENTRY)
Bilirubin, UA: NEGATIVE
Blood, UA: NEGATIVE
Glucose, UA: NEGATIVE mg/dL
Ketones, POC UA: NEGATIVE mg/dL
Leukocytes, UA: NEGATIVE
Nitrite, UA: NEGATIVE
Protein Ur, POC: NEGATIVE mg/dL
Spec Grav, UA: 1.025
Urobilinogen, UA: 0.2 U/dL
pH, UA: 5.5

## 2024-01-02 MED ORDER — LIDOCAINE 5 % EX PTCH: 1.0000 | MEDICATED_PATCH | CUTANEOUS | 0 refills | Status: DC

## 2024-01-02 NOTE — Discharge Instructions (Addendum)
 You can apply a lidocaine  patch for 12 hours at a time once daily to help with your back pain. Alternate between 650 mg of Tylenol  and 400 mg of ibuprofen  every 6-8 hours as needed for pain. Alternate between ice and heat as needed, and do some gentle stretching to help with pain. Follow-up with [facility] if pain continues. Return here as needed.

## 2024-01-02 NOTE — ED Triage Notes (Signed)
 Pt states that she has right lower back pain since yesterday. She has been taking some IBU as needed which helps. She doesn't know if she has a UTI or its due to a fall she had a week ago.

## 2024-01-02 NOTE — ED Provider Notes (Signed)
 [facility]    CSN: [id number] Arrival date & time: 01/02/24  1550      History   Chief Complaint Chief Complaint  Patient presents with   Back Pain    HPI Adrienne Collins is a 53 y.o. female.   Patient presents with right lower back pain that began yesterday.  Denies numbness, tingling, weakness, and radiating pain down legs.  Patient is concerned that it could be related to a urinary tract infection.  Patient denies dysuria, hematuria, urinary frequency/urgency, fever, nausea, vomiting, or abdominal pain.  Patient states that last week while she was at work she slipped on some water and fell landing on her hands and knees and thought this may be related to her pain as well. Patient states that she has been taking ibuprofen  with relief, but the pain will return.  The history is provided by the patient and medical records.  Back Pain   Past Medical History:  Diagnosis Date   Abnormal screening mammogram 07/01/2013   01/2013   Abnormal vaginal bleeding 01/17/2022   Anemia    Anemia, iron  deficiency 11/18/2006   Qualifier: History of  By: Tarry Farmer MD, Wayne     Arthritis    Breast pain, left 11/28/2014   Carpal tunnel syndrome 07/20/2014   Cervical cancer screening 06/14/2020   CERVICAL RADICULOPATHY, RIGHT 04/26/2008   Qualifier: Diagnosis of   By: Tarry Farmer MD, Wayne      Replacing diagnoses that were inactivated after the 11/11/22 regulatory import     Cervicalgia 12/19/2021   Chest tightness 02/11/2012   At rest   Chronic right hip pain 01/11/2016   Colon polyps    Constipation 11/02/2014   Fatigue 06/02/2015   Fibroid uterus 04/26/2021   Generalized joint pain 01/27/2007   Acetaminophen  for daily pain, Short course of Ibuprofen  for more severe flare.     History of colonic polyps 06/07/2016   Colonoscopy 2010 Hepatic flexure sessile polyp measuring 1.0 cm Transverse colon sessile polyp measuring 4 mm   History of ETT 08/2006   no sustained tachycardia    Hypertension    Left arm pain 11/28/2014   Left knee pain 01/10/2017   Left shoulder pain 01/11/2016   LVH (left ventricular hypertrophy) 06/07/2016   EKG 06/07/16   Memory change 09/05/2021   Menorrhagia 04/21/2012   Morbid obesity (HCC) 10/03/2014   2015  BMI 36 2017  BMI 37  05/2016 ASCVD 2.4%   Neuropathy 11/25/2017   OSA (obstructive sleep apnea) 03/20/2016   Sleep study 07/2015 showed mild OSA. No CPAP recommended.    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   Unspecified vitamin D  deficiency 12/09/2012   Level 23 on 12/08/12 and Vitamin D3 1000-2000 IU daily was recommended   Uterine leiomyoma 11/22/2021   Vitamin D  deficiency 12/09/2012   Level 23 on 12/08/12 and Vitamin D3 1000-2000 IU daily was recommended  IMO SNOMED Dx Update Oct 2024      Patient Active Problem List   Diagnosis Date Noted   Chronic migraine w/o aura w/o status migrainosus, not intractable 10/20/2023   Worsening headaches 10/20/2023   Cerebral vascular disease 10/20/2023   Cardiac murmur 07/24/2022   Abnormal vaginal bleeding 01/17/2022   Cervicalgia 12/19/2021   Uterine leiomyoma 11/22/2021   Status post embolization  of uterine artery 11/22/2021   Memory change 09/05/2021   Anemia 05/29/2021   Arthritis 05/29/2021   Colon polyps 05/29/2021   Fibroid uterus 04/26/2021   Right shoulder pain 08/23/2020   Cervical cancer screening 06/14/2020   Rotator cuff impingement syndrome 08/17/2018   Prediabetes 08/17/2018   Hypertension 08/17/2018   Palpitations 12/03/2017   Shortness of breath 11/25/2017   Neuropathy 11/25/2017   Left knee pain 01/10/2017   History of colonic polyps 06/07/2016   LVH (left ventricular hypertrophy) 06/07/2016   OSA (obstructive sleep apnea) 03/20/2016   Left  shoulder pain 01/11/2016   Chronic right hip pain 01/11/2016   Fatigue 06/02/2015   Breast pain, left 11/28/2014   Left arm pain 11/28/2014   Constipation 11/02/2014   Preventative health care 10/03/2014   Morbid obesity (HCC) 10/03/2014   Carpal tunnel syndrome 07/20/2014   Abnormal screening mammogram 07/01/2013   Vitamin D  deficiency 12/09/2012   Menorrhagia 04/21/2012   Chest tightness 02/11/2012   CERVICAL RADICULOPATHY, RIGHT 04/26/2008   Generalized joint pain 01/27/2007   Anemia, iron  deficiency 11/18/2006   History of ETT 08/2006    Past Surgical History:  Procedure Laterality Date   CESAREAN SECTION  1990   CESAREAN SECTION  1991   IR ANGIOGRAM PELVIS SELECTIVE OR SUPRASELECTIVE  11/22/2021   IR ANGIOGRAM SELECTIVE EACH ADDITIONAL VESSEL  11/22/2021   IR ANGIOGRAM SELECTIVE EACH ADDITIONAL VESSEL  11/22/2021   IR EMBO TUMOR ORGAN ISCHEMIA INFARCT INC GUIDE ROADMAPPING  11/22/2021   IR RADIOLOGIST EVAL & MGMT  08/14/2021   IR RADIOLOGIST EVAL & MGMT  12/20/2021   IR US  GUIDE VASC ACCESS LEFT  11/22/2021   RADIOLOGY WITH ANESTHESIA N/A 01/29/2022   Procedure: CERVICAL SPINE WITHOUT CONTRAST,BRAIN WITH AND WITHOUT CONTRAST;  Surgeon: Radiologist, Medication, MD;  Location: [facility] OR;  Service: Radiology;  Laterality: N/A;   SALPINGECTOMY Right     OB History     Gravida  2   Para  2   Term  2   Preterm      AB      Living  2      SAB      IAB      Ectopic      Multiple      Live Births               Home Medications    Prior to Admission medications   Medication Sig Start Date End Date Taking? Authorizing Provider  aspirin  EC 81 MG tablet Take 1 tablet (81 mg total) by mouth daily. Swallow whole. 06/16/23  Yes Limmie Ren, MD  Biotin 1000 MCG CHEW Chew 1,000 mg by mouth daily.   Yes [provider]  Enalapril -hydroCHLOROthiazide  5-12.5 MG tablet TAKE 1 TABLET BY MOUTH DAILY 03/03/23  Yes Candee Cha, MD  ibuprofen  (ADVIL ) 200 MG  tablet Take 600 mg by mouth every 6 (six) hours as needed for mild pain or moderate pain.   Yes [provider]  lidocaine  (LIDODERM ) 5 % Place 1 patch onto the skin daily. Remove & Discard patch within 12 hours or as directed by MD 01/02/24  Yes Levora Reas A, NP  metoprolol  succinate (TOPROL -XL) 50 MG 24 hr tablet TAKE 1 TABLET BY MOUTH IN THE  EVENING 03/03/23  Yes Candee Cha, MD  acetaminophen  (TYLENOL ) 650 MG CR tablet Take 2 tablets (1,300 mg total) by mouth every 8 (eight) hours as needed for pain. take 2 tabs every 6  hours as needed for pain 10/24/10   Cisco Crest, MD  cetirizine  (ZYRTEC  ALLERGY) 10 MG tablet Take 1 tablet (10 mg total) by mouth daily. 10/30/21   Autry-Lott, Jeneane Miracle, DO  clobetasol (TEMOVATE) 0.05 % external solution Apply 1 Application topically daily as needed (rash).    [provider]  desonide (DESOWEN) 0.05 % cream Apply 1 Application topically 2 (two) times daily.    [provider]  diclofenac  Sodium (VOLTAREN ) 1 % GEL Apply 2 g topically daily as needed (For shoulder pain). Patient not taking: Reported on 10/20/2023    [provider]  fluticasone  (FLONASE ) 50 MCG/ACT nasal spray Place 2 sprays into both nostrils daily. Patient not taking: Reported on 10/20/2023 04/02/22   Candee Cha, MD  nitroGLYCERIN  (NITROSTAT ) 0.3 MG SL tablet Place 1 tablet (0.3 mg total) under the tongue every 5 (five) minutes as needed for chest pain. Patient not taking: Reported on 10/20/2023 06/16/23   Limmie Ren, MD  Zoster Vaccine Adjuvanted (SHINGRIX ) injection Administer Shingrix  vaccination now and repeat in two months Patient not taking: Reported on 10/20/2023 02/10/23   Candee Cha, MD    Family History Family History  Problem Relation Age of Onset   Glaucoma Mother    Diabetes Mother    Hypertension Mother    Heart failure Mother    Kidney disease Mother    Stroke Sister        drug abuse   Diabetes Brother     Kidney failure Brother    Lung cancer Maternal Grandfather        smoker    Social History Social History   Tobacco Use   Smoking status: Former    Current packs/day: 0.00    Types: Cigarettes    Quit date: 01/30/1994    Years since quitting: 29.9    Passive exposure: Past   Smokeless tobacco: Never  Substance Use Topics   Alcohol use: No    Comment: quit 1995   Drug use: No     Allergies   Patient has no known allergies.   Review of Systems Review of Systems  Musculoskeletal:  Positive for back pain.   Per HPI  Physical Exam Triage Vital Signs ED Triage Vitals  Encounter Vitals Group     BP 01/02/24 1650 138/85     Systolic BP Percentile --      Diastolic BP Percentile --      Pulse Rate 01/02/24 1650 (!) 52     Resp 01/02/24 1650 18     Temp 01/02/24 1650 98.6 F (37 C)     Temp Source 01/02/24 1650 Oral     SpO2 01/02/24 1650 97 %     Weight --      Height --      Head Circumference --      Peak Flow --      Pain Score 01/02/24 1649 6     Pain Loc --      Pain Education --      Exclude from Growth Chart --    No data found.  Updated Vital Signs BP 138/85 (BP Location: Right Arm)   Pulse (!) 52   Temp 98.6 F (37 C) (Oral)   Resp 18   SpO2 97%   Visual Acuity Right Eye Distance:   Left Eye Distance:   Bilateral Distance:    Right Eye Near:   Left Eye Near:    Bilateral Near:  Physical Exam Vitals and nursing note reviewed.  Constitutional:      General: She is awake. She is not in acute distress.    Appearance: Normal appearance. She is well-developed and well-groomed. She is not ill-appearing.  Musculoskeletal:     Cervical back: Normal.     Thoracic back: Normal.     Lumbar back: Tenderness present. No swelling, deformity, signs of trauma or bony tenderness. Normal range of motion. Negative right straight leg raise test and negative left straight leg raise test.  Skin:    General: Skin is warm and dry.  Neurological:      Mental Status: She is alert.  Psychiatric:        Behavior: Behavior is cooperative.      UC Treatments / Results  Labs (all labs ordered are listed, but only abnormal results are displayed) Labs Reviewed  POCT URINALYSIS DIP (MANUAL ENTRY) - Normal    EKG   Radiology No results found.  Procedures Procedures (including critical care time)  Medications Ordered in UC Medications - No data to display  Initial Impression / Assessment and Plan / UC Course  I have reviewed the triage vital signs and the nursing notes.  Pertinent labs & imaging results that were available during my care of the patient were reviewed by me and considered in my medical decision making (see chart for details).     Patient is well-appearing.  Vitals are stable.  Upon assessment there is mild tenderness noted to right low back, without swelling, deformity, trauma, decreased range of motion, or spinous process tenderness.  Negative bilateral straight leg raise test.  Urinalysis unremarkable.  Prescribed lidocaine  patches as needed for pain.  Recommended alternating between Tylenol  and ibuprofen  as needed for pain.  Given orthopedic follow-up if needed.  Discussed return precautions. Final Clinical Impressions(s) / UC Diagnoses   Final diagnoses:  Acute right-sided low back pain without sciatica  Fall, initial encounter     Discharge Instructions      You can apply a lidocaine  patch for 12 hours at a time once daily to help with your back pain. Alternate between 650 mg of Tylenol  and 400 mg of ibuprofen  every 6-8 hours as needed for pain. Alternate between ice and heat as needed, and do some gentle stretching to help with pain. Follow-up with [facility] if pain continues. Return here as needed.  ED Prescriptions     Medication Sig Dispense Auth. Provider   lidocaine  (LIDODERM ) 5 % Place 1 patch onto the skin daily. Remove & Discard patch within 12 hours or as directed by MD 30 patch  Karon Packer, NP      PDMP not reviewed this encounter.   Levora Reas A, NP 01/02/24 1714

## 2024-01-16 DIAGNOSIS — L218 Other seborrheic dermatitis: Secondary | ICD-10-CM | POA: Diagnosis not present

## 2024-02-26 DIAGNOSIS — M542 Cervicalgia: Secondary | ICD-10-CM | POA: Diagnosis not present

## 2024-02-27 ENCOUNTER — Ambulatory Visit: Admitting: Student

## 2024-02-27 ENCOUNTER — Other Ambulatory Visit: Payer: Self-pay | Admitting: Family Medicine

## 2024-02-27 ENCOUNTER — Encounter: Payer: Self-pay | Admitting: Student

## 2024-02-27 VITALS — BP 132/83 | HR 74 | Ht 61.0 in | Wt 207.0 lb

## 2024-02-27 DIAGNOSIS — M542 Cervicalgia: Secondary | ICD-10-CM | POA: Diagnosis not present
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
## 2024-03-04 DIAGNOSIS — M4802 Spinal stenosis, cervical region: Secondary | ICD-10-CM | POA: Diagnosis not present

## 2024-03-04 DIAGNOSIS — M542 Cervicalgia: Secondary | ICD-10-CM | POA: Diagnosis not present

## 2024-03-16 DIAGNOSIS — M4802 Spinal stenosis, cervical region: Secondary | ICD-10-CM | POA: Diagnosis not present

## 2024-03-16 DIAGNOSIS — M542 Cervicalgia: Secondary | ICD-10-CM | POA: Diagnosis not present

## 2024-03-24 DIAGNOSIS — M542 Cervicalgia: Secondary | ICD-10-CM | POA: Diagnosis not present

## 2024-03-24 DIAGNOSIS — M4802 Spinal stenosis, cervical region: Secondary | ICD-10-CM | POA: Diagnosis not present

## 2024-04-19 DIAGNOSIS — Z1231 Encounter for screening mammogram for malignant neoplasm of breast: Secondary | ICD-10-CM | POA: Diagnosis not present

## 2024-04-19 LAB — HM MAMMOGRAPHY

## 2024-04-23 ENCOUNTER — Encounter: Payer: Self-pay | Admitting: Family Medicine

## 2024-05-04 ENCOUNTER — Ambulatory Visit: Admitting: Cardiology

## 2024-07-05 ENCOUNTER — Ambulatory Visit: Payer: Self-pay | Admitting: Family Medicine

## 2024-07-05 ENCOUNTER — Ambulatory Visit: Admitting: Family Medicine

## 2024-07-05 VITALS — BP 132/74 | HR 56 | Ht 61.0 in | Wt 204.6 lb

## 2024-07-05 DIAGNOSIS — I10 Essential (primary) hypertension: Secondary | ICD-10-CM

## 2024-07-05 DIAGNOSIS — N920 Excessive and frequent menstruation with regular cycle: Secondary | ICD-10-CM | POA: Diagnosis not present

## 2024-07-05 DIAGNOSIS — Z0000 Encounter for general adult medical examination without abnormal findings: Secondary | ICD-10-CM | POA: Diagnosis not present

## 2024-07-05 DIAGNOSIS — Z0184 Encounter for antibody response examination: Secondary | ICD-10-CM | POA: Diagnosis not present

## 2024-07-05 DIAGNOSIS — G4719 Other hypersomnia: Secondary | ICD-10-CM

## 2024-07-05 DIAGNOSIS — G4733 Obstructive sleep apnea (adult) (pediatric): Secondary | ICD-10-CM

## 2024-07-05 DIAGNOSIS — Z23 Encounter for immunization: Secondary | ICD-10-CM | POA: Diagnosis not present

## 2024-07-05 DIAGNOSIS — R7303 Prediabetes: Secondary | ICD-10-CM

## 2024-07-05 DIAGNOSIS — D508 Other iron deficiency anemias: Secondary | ICD-10-CM

## 2024-07-05 DIAGNOSIS — K5900 Constipation, unspecified: Secondary | ICD-10-CM

## 2024-07-05 LAB — POCT GLYCOSYLATED HEMOGLOBIN (HGB A1C): HbA1c, POC (prediabetic range): 6.3 % (ref 5.7–6.4)

## 2024-07-05 MED ORDER — SHINGRIX 50 MCG/0.5ML IM SUSR: INTRAMUSCULAR | 1 refills | Status: DC

## 2024-07-05 MED ORDER — SHINGRIX 50 MCG/0.5ML IM SUSR: INTRAMUSCULAR | 1 refills | Status: AC

## 2024-07-05 NOTE — Progress Notes (Unsigned)
  Date of Visit: 07/05/2024   SUBJECTIVE:   HPI:  Adrienne Collins presents today for a well woman exam.   Concerns today: see below Periods: irregular, see below Contraception: abstinent Pelvic symptoms: no discharge  Sexual activity: none STD Screening: declines, no concern Pap smear status: UTD, due in 1 year Exercise: walks at work Diet: tries to eat healthy, eats fiber Smoking: no Alcohol: no Drugs: no Advance directives: does not have, interested in having this Mood: generally ok Cancers in family: mom breast cancer in 70s-80s  Sleep disturbance and suspected obstructive sleep apnea - Daytime sleepiness and low energy - Frequent snoring - Wakes up feeling tired - Suspects episodes of apnea during sleep - Previous attempts at sleep study unsuccessful due to insurance issues; now has new insurance - Concerned about potential claustrophobia with CPAP use  Menstrual irregularity and abnormal uterine bleeding - Irregular menstrual cycles with unpredictable timing and duration - Cycles may last two days, stop, and then resume a week later - Persistent irregular bleeding despite uterine artery ablation at age 53 - Previous cancer screenings and endometrial biopsies negative - Ongoing concern regarding cycle irregularity  Cardiovascular risk factors and hypertension - Concern about cholesterol and A1c levels; previous elevation in cholesterol - History of hypertension - Currently taking enalapril  HCTZ 5-12.5 mg daily and metoprolol  XL 50 mg daily - Aspirin  81 mg daily as recommended by neurologist due to MRI findings of very tiny vessels in the brain  Gastrointestinal symptoms and constipation - Diet includes high fiber, greens, and chia seeds - Persistent constipation despite dietary efforts - Increased water intake - Interest in nutritionist referral for gut health optimization   OBJECTIVE:   BP 132/74   Pulse (!) 56   Ht 5' 1 (1.549 m)   Wt 204 lb 9.6 oz (92.8 kg)    SpO2 99%   BMI 38.66 kg/m  Gen: NAD, pleasant, cooperative HEENT: NCAT, PERRL, no palpable thyromegaly or anterior cervical lymphadenopathy Heart: RRR, no murmurs Lungs: CTAB, NWOB Abdomen: soft, nontender to palpation Neuro: grossly nonfocal, speech normal  ASSESSMENT/PLAN:   Assessment & Plan Routine adult health maintenance -STD screening: declines -pap smear: UTD -mammogram: UTD -lipid screening: check today -colon cancer screening: UTD -immunizations:  Flu: given today Tdap: UTD Shingrix : given rx to get at pharmacy COVID: declines Pneumovax: PCV 20 today Hep B: check titer for immunity status -advance directives: will give advanced directive packet -handout given on health maintenance topics Menorrhagia with regular cycle Irregular cycles likely due to perimenopause. Discussed risks of irregular bleeding, including endometrial cancer, though previous biopsies were negative. - Recommended follow-up with gynecology for evaluation of irregular bleeding. Morbid obesity (HCC) Discussed weight management and dietary habits. Interested in nutritional guidance and weight loss support. - [facility] for nutrition and weight management support. Excessive daytime sleepiness Symptoms suggestive of obstructive sleep apnea. Not currently using cpap. No sleep study in many years - split night study ordered Primary hypertension Well controlled. Continue current medication regimen.  Prediabetes Update A1c today Other iron  deficiency anemia Check CBC and iron  studies today Constipation, unspecified constipation type Chronic constipation with dietary modifications attempted. - Referred to nutritionist for dietary guidance. - Will consider GI referral if symptoms persist.  [name] J. Donah, MD [facility]

## 2024-07-05 NOTE — Patient Instructions (Addendum)
 It was great to see you again today.  Call the [facility] to set up an appointment for weight management. Their number is [phone].   Checking labs today - CBC, iron  studies, cholesterol, A1c  Take shingles vaccine prescription to your pharmacy   See advanced directive packet  Ordered sleep study  Flu and pneumonia vaccines today  Be well, Dr. Donah

## 2024-07-06 LAB — IRON,TIBC AND FERRITIN PANEL
Ferritin: 18 ng/mL (ref 15–150)
Iron Saturation: 8 % — CL (ref 15–55)
Iron: 37 ug/dL (ref 27–159)
Total Iron Binding Capacity: 454 ug/dL — ABNORMAL HIGH (ref 250–450)
UIBC: 417 ug/dL (ref 131–425)

## 2024-07-06 LAB — CBC
Hematocrit: 37.8 % (ref 34.0–46.6)
Hemoglobin: 11.3 g/dL (ref 11.1–15.9)
MCH: 23.6 pg — ABNORMAL LOW (ref 26.6–33.0)
MCHC: 29.9 g/dL — ABNORMAL LOW (ref 31.5–35.7)
MCV: 79 fL (ref 79–97)
Platelets: 326 x10E3/uL (ref 150–450)
RBC: 4.78 x10E6/uL (ref 3.77–5.28)
RDW: 16.2 % — ABNORMAL HIGH (ref 11.7–15.4)
WBC: 5.3 x10E3/uL (ref 3.4–10.8)

## 2024-07-06 LAB — LIPID PANEL
Chol/HDL Ratio: 3.9 ratio (ref 0.0–4.4)
Cholesterol, Total: 199 mg/dL (ref 100–199)
HDL: 51 mg/dL (ref >39)
LDL Chol Calc (NIH): 127 mg/dL — ABNORMAL HIGH (ref 0–99)
Triglycerides: 118 mg/dL (ref 0–149)
VLDL Cholesterol Cal: 21 mg/dL (ref 5–40)

## 2024-07-06 LAB — HEPATITIS B SURFACE ANTIBODY, QUANTITATIVE: Hepatitis B Surf Ab Quant: <3.5 m[IU]/mL — ABNORMAL LOW

## 2024-07-07 NOTE — Assessment & Plan Note (Signed)
 Irregular cycles likely due to perimenopause. Discussed risks of irregular bleeding, including endometrial cancer, though previous biopsies were negative. - Recommended follow-up with gynecology for evaluation of irregular bleeding.

## 2024-07-07 NOTE — Assessment & Plan Note (Signed)
 Chronic constipation with dietary modifications attempted. - Referred to nutritionist for dietary guidance. - Will consider GI referral if symptoms persist.

## 2024-07-07 NOTE — Assessment & Plan Note (Signed)
 Discussed weight management and dietary habits. Interested in nutritional guidance and weight loss support. - Referred to [facility] for nutrition and weight management support.

## 2024-07-07 NOTE — Assessment & Plan Note (Signed)
Update A1c today 

## 2024-07-07 NOTE — Assessment & Plan Note (Signed)
 Check CBC and iron studies today

## 2024-07-07 NOTE — Assessment & Plan Note (Signed)
 Well controlled. Continue current medication regimen.

## 2024-07-20 ENCOUNTER — Ambulatory Visit: Admitting: Student

## 2024-07-20 ENCOUNTER — Encounter: Payer: Self-pay | Admitting: Student

## 2024-07-20 VITALS — BP 136/82 | HR 67 | Ht 61.0 in | Wt 203.8 lb

## 2024-07-20 DIAGNOSIS — M76891 Other specified enthesopathies of right lower limb, excluding foot: Secondary | ICD-10-CM | POA: Diagnosis not present

## 2024-07-20 DIAGNOSIS — M7631 Iliotibial band syndrome, right leg: Secondary | ICD-10-CM

## 2024-07-20 NOTE — Progress Notes (Signed)
    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  No rash.  TTP over IT band on right, no TTP over greater trochanter.  3/5 strength of hip abduction.  FADIR/FABER negative bilaterally, straight leg negative bilaterally. Ober's positive on right.  ASSESSMENT/PLAN:   Assessment & Plan Iliotibial band syndrome of right side Hip abductor tendonitis, right -Referral to Physical therapy -OTC analgesics -Stretches provided in AVS -Follow-up in 6-8 weeks if not improved   Gladis Church, DO [facility]

## 2024-07-20 NOTE — Patient Instructions (Addendum)
 It was great to see you! Thank you for allowing me to participate in your care!   I recommend that you always bring your medications to each appointment as this makes it easy to ensure we are on the correct medications and helps us  not miss when refills are needed.  Our plans for today:  - Please use Tylenol /ibuprofen  for pain control.  Additionally can use heat and ice, as well as lidocaine  patches. -I have sent a referral to physical therapy they will call you to make an appointment -Please follow the exercises provided on the back of the sheet - Follow-up in 6-8 weeks if not improving    Take care and seek immediate care sooner if you develop any concerns. Please remember to show up 15 minutes before your scheduled appointment time!  Gladis Church, DO [facility]

## 2024-07-24 ENCOUNTER — Encounter (HOSPITAL_COMMUNITY): Payer: Self-pay | Admitting: Emergency Medicine

## 2024-07-24 ENCOUNTER — Other Ambulatory Visit: Payer: Self-pay

## 2024-07-24 ENCOUNTER — Ambulatory Visit (HOSPITAL_COMMUNITY)
Admission: EM | Admit: 2024-07-24 | Discharge: 2024-07-24 | Disposition: A | Discharge status: Home / Self Care | Attending: Emergency Medicine | Admitting: Emergency Medicine

## 2024-07-24 DIAGNOSIS — M62830 Muscle spasm of back: Secondary | ICD-10-CM

## 2024-07-24 DIAGNOSIS — R10A1 Flank pain, right side: Secondary | ICD-10-CM | POA: Diagnosis not present

## 2024-07-24 LAB — POCT URINE DIPSTICK
Bilirubin, UA: NEGATIVE
Glucose, UA: NEGATIVE mg/dL
Ketones, POC UA: NEGATIVE mg/dL
Leukocytes, UA: NEGATIVE
Nitrite, UA: NEGATIVE
POC PROTEIN,UA: NEGATIVE
Spec Grav, UA: <=1.005 — AB (ref 1.010–1.025)
Urobilinogen, UA: 0.2 U/dL
pH, UA: 6 (ref 5.0–8.0)

## 2024-07-24 MED ORDER — DEXAMETHASONE SOD PHOSPHATE PF 10 MG/ML IJ SOLN
10.0000 mg | Freq: Once | INTRAMUSCULAR | Status: AC
  Administered 2024-07-24: 10 mg via INTRAMUSCULAR

## 2024-07-24 MED ORDER — PREDNISONE 20 MG PO TABS: 40.0000 mg | ORAL_TABLET | Freq: Every day | ORAL | 0 refills | Status: AC

## 2024-07-24 NOTE — ED Provider Notes (Signed)
 [facility]    CSN: [id number] Arrival date & time: 07/24/24  1653      History   Chief Complaint No chief complaint on file.   HPI Adrienne Collins is a 53 y.o. female.   Patient presents with right mid back pain/flank pain that began 3 days ago.  Patient reports the pain is intermittent and does worsen with movement.  Patient describes the pain as a very tight spasming at times.  Patient denies any dysuria, hematuria, urinary frequency/urgency, abdominal pain, fever, body aches, and chills.  Patient also denies any numbness, weakness, tingling, or pain that radiates down her legs.  Patient reports that she has been taking ibuprofen  and tizanidine with minimal improvement.  Patient denies any recent falls or known injuries.  The history is provided by the patient and medical records.    Past Medical History:  Diagnosis Date   Abnormal screening mammogram 07/01/2013   01/2013   Abnormal vaginal bleeding 01/17/2022   Anemia    Anemia, iron  deficiency 11/18/2006   Qualifier: History of  By: Levonne MD, Wayne     Arthritis    Breast pain, left 11/28/2014   Carpal tunnel syndrome 07/20/2014   Cervical cancer screening 06/14/2020   CERVICAL RADICULOPATHY, RIGHT 04/26/2008   Qualifier: Diagnosis of   By: Levonne MD, Wayne      Replacing diagnoses that were inactivated after the 11/11/22 regulatory import     Cervicalgia 12/19/2021   Chest tightness 02/11/2012   At rest   Chronic right hip pain 01/11/2016   Colon polyps    Constipation 11/02/2014   Fatigue 06/02/2015   Fibroid uterus 04/26/2021   Generalized joint pain 01/27/2007   Acetaminophen  for daily pain, Short course of Ibuprofen  for more severe flare.     History of colonic polyps 06/07/2016   Colonoscopy 2010 Hepatic flexure sessile polyp measuring 1.0 cm Transverse colon sessile polyp measuring 4 mm   History of ETT 08/2006   no sustained tachycardia   Hypertension    Left arm pain 11/28/2014   Left knee pain  01/10/2017   Left shoulder pain 01/11/2016   LVH (left ventricular hypertrophy) 06/07/2016   EKG 06/07/16   Memory change 09/05/2021   Menorrhagia 04/21/2012   Morbid obesity (HCC) 10/03/2014   2015  BMI 36 2017  BMI 37  05/2016 ASCVD 2.4%   Neuropathy 11/25/2017   OSA (obstructive sleep apnea) 03/20/2016   Sleep study 07/2015 showed mild OSA. No CPAP recommended.    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   Unspecified vitamin D  deficiency 12/09/2012   Level 23 on 12/08/12 and Vitamin D3 1000-2000 IU daily was recommended   Uterine leiomyoma 11/22/2021   Vitamin D  deficiency 12/09/2012   Level 23 on 12/08/12 and Vitamin D3 1000-2000 IU daily was recommended  IMO SNOMED Dx Update Oct 2024      Patient Active Problem List   Diagnosis Date Noted   Chronic migraine w/o aura w/o status migrainosus, not intractable 10/20/2023   Worsening headaches 10/20/2023   Cerebral vascular disease 10/20/2023   Cardiac murmur 07/24/2022   Abnormal vaginal bleeding 01/17/2022   Cervicalgia 12/19/2021   Uterine leiomyoma 11/22/2021   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   Memory change 09/05/2021   Anemia 05/29/2021  Arthritis 05/29/2021   Colon polyps 05/29/2021   Fibroid uterus 04/26/2021   Right shoulder pain 08/23/2020   Cervical cancer screening 06/14/2020   Rotator cuff impingement syndrome 08/17/2018   Prediabetes 08/17/2018   Hypertension 08/17/2018   Palpitations 12/03/2017   Shortness of breath 11/25/2017   Neuropathy 11/25/2017   Left knee pain 01/10/2017   History of colonic polyps 06/07/2016   LVH (left ventricular hypertrophy) 06/07/2016   OSA (obstructive sleep apnea) 03/20/2016   Left shoulder pain 01/11/2016   Chronic right hip pain 01/11/2016    Fatigue 06/02/2015   Breast pain, left 11/28/2014   Left arm pain 11/28/2014   Constipation 11/02/2014   Preventative health care 10/03/2014   Morbid obesity (HCC) 10/03/2014   Carpal tunnel syndrome 07/20/2014   Abnormal screening mammogram 07/01/2013   Vitamin D  deficiency 12/09/2012   Menorrhagia 04/21/2012   Chest tightness 02/11/2012   CERVICAL RADICULOPATHY, RIGHT 04/26/2008   Generalized joint pain 01/27/2007   Anemia, iron  deficiency 11/18/2006   History of ETT 08/2006    Past Surgical History:  Procedure Laterality Date   CESAREAN SECTION  1990   CESAREAN SECTION  1991   IR ANGIOGRAM PELVIS SELECTIVE OR SUPRASELECTIVE  11/22/2021   IR ANGIOGRAM SELECTIVE EACH ADDITIONAL VESSEL  11/22/2021   IR ANGIOGRAM SELECTIVE EACH ADDITIONAL VESSEL  11/22/2021   IR EMBO TUMOR ORGAN ISCHEMIA INFARCT INC GUIDE ROADMAPPING  11/22/2021   IR RADIOLOGIST EVAL & MGMT  08/14/2021   IR RADIOLOGIST EVAL & MGMT  12/20/2021   IR US  GUIDE VASC ACCESS LEFT  11/22/2021   RADIOLOGY WITH ANESTHESIA N/A 01/29/2022   Procedure: CERVICAL SPINE WITHOUT CONTRAST,BRAIN WITH AND WITHOUT CONTRAST;  Surgeon: Radiologist, Medication, MD;  Location: [facility] OR;  Service: Radiology;  Laterality: N/A;   SALPINGECTOMY Right     OB History     Gravida  2   Para  2   Term  2   Preterm      AB      Living  2      SAB      IAB      Ectopic      Multiple      Live Births               Home Medications    Prior to Admission medications  Medication Sig Start Date End Date Taking? Authorizing Provider  aspirin  EC 81 MG tablet Take 1 tablet (81 mg total) by mouth daily. Swallow whole. 06/16/23  Yes Marlee Lynwood NOVAK, MD  cetirizine  (ZYRTEC  ALLERGY) 10 MG tablet Take 1 tablet (10 mg total) by mouth daily. 10/30/21  Yes Autry-Lott, Rojean, DO  Enalapril -hydroCHLOROthiazide  5-12.5 MG tablet TAKE 1 TABLET BY MOUTH DAILY 02/27/24  Yes Donah Laymon PARAS, MD  metoprolol  succinate (TOPROL -XL) 50 MG 24 hr tablet  TAKE 1 TABLET BY MOUTH IN THE  EVENING 02/27/24  Yes Donah Laymon PARAS, MD  predniSONE  (DELTASONE ) 20 MG tablet Take 2 tablets (40 mg total) by mouth daily for 5 days. 07/24/24 07/29/24 Yes Johnie, Lesli Issa A, NP  acetaminophen  (TYLENOL ) 650 MG CR tablet Take 2 tablets (1,300 mg total) by mouth every 8 (eight) hours as needed for pain. take 2 tabs every 6 hours as needed for pain 10/24/10   Levonne Lin, MD  Biotin 1000 MCG CHEW Chew 1,000 mg by mouth daily.    [provider]  clobetasol (TEMOVATE) 0.05 % external solution Apply 1 Application topically daily as needed (rash).  [provider]  desonide (DESOWEN) 0.05 % cream Apply 1 Application topically 2 (two) times daily.    [provider]  fluticasone  (FLONASE ) 50 MCG/ACT nasal spray Place 2 sprays into both nostrils daily. 04/02/22   Donah Laymon PARAS, MD  ibuprofen  (ADVIL ) 200 MG tablet Take 600 mg by mouth every 6 (six) hours as needed for mild pain or moderate pain.    [provider]  nitroGLYCERIN  (NITROSTAT ) 0.3 MG SL tablet Place 1 tablet (0.3 mg total) under the tongue every 5 (five) minutes as needed for chest pain. 06/16/23   Marlee Lynwood NOVAK, MD  Zoster Vaccine Adjuvanted (SHINGRIX ) injection Administer Shingrix  vaccination now and repeat in two months 07/05/24   Donah Laymon PARAS, MD    Family History Family History  Problem Relation Age of Onset   Glaucoma Mother    Diabetes Mother    Hypertension Mother    Heart failure Mother    Kidney disease Mother    Stroke Sister        drug abuse   Diabetes Brother    Kidney failure Brother    Lung cancer Maternal Grandfather        smoker    Social History Social History[1]   Allergies   Patient has no known allergies.   Review of Systems Review of Systems  Per HPI  Physical Exam Triage Vital Signs ED Triage Vitals  Encounter Vitals Group     BP 07/24/24 1758 111/77     Girls Systolic BP Percentile --      Girls  Diastolic BP Percentile --      Boys Systolic BP Percentile --      Boys Diastolic BP Percentile --      Pulse Rate 07/24/24 1758 65     Resp 07/24/24 1758 20     Temp 07/24/24 1758 98.4 F (36.9 C)     Temp Source 07/24/24 1758 Oral     SpO2 07/24/24 1758 97 %     Weight --      Height --      Head Circumference --      Peak Flow --      Pain Score 07/24/24 1754 8     Pain Loc --      Pain Education --      Exclude from Growth Chart --    No data found.  Updated Vital Signs BP 111/77 (BP Location: Left Arm) Comment (BP Location): large cuff  Pulse 65   Temp 98.4 F (36.9 C) (Oral)   Resp 20   LMP 07/19/2024 (Approximate)   SpO2 97%   Visual Acuity Right Eye Distance:   Left Eye Distance:   Bilateral Distance:    Right Eye Near:   Left Eye Near:    Bilateral Near:     Physical Exam Vitals and nursing note reviewed.  Constitutional:      General: She is awake. She is not in acute distress.    Appearance: Normal appearance. She is well-developed and well-groomed. She is not ill-appearing.  Musculoskeletal:     Cervical back: Normal.     Thoracic back: Spasms and tenderness present. No swelling, edema, deformity, signs of trauma or bony tenderness. Normal range of motion.     Lumbar back: Normal.       Back:     Comments: Muscular tenderness noted to right mid thoracic back.  Without any spinous process tenderness.  Skin:    General: Skin is warm  and dry.  Neurological:     Mental Status: She is alert.  Psychiatric:        Behavior: Behavior is cooperative.      UC Treatments / Results  Labs (all labs ordered are listed, but only abnormal results are displayed) Labs Reviewed  POCT URINE DIPSTICK - Abnormal; Notable for the following components:      Result Value   Spec Grav, UA <=1.005 (*)    Blood, UA large (*)    All other components within normal limits    EKG   Radiology No results found.  Procedures Procedures (including critical care  time)  Medications Ordered in UC Medications  dexamethasone  (DECADRON ) injection 10 mg (has no administration in time range)    Initial Impression / Assessment and Plan / UC Course  I have reviewed the triage vital signs and the nursing notes.  Pertinent labs & imaging results that were available during my care of the patient were reviewed by me and considered in my medical decision making (see chart for details).     Patient is overall well-appearing.  Vitals are stable.  Urinalysis does reveal large RBCs, but patient is currently on her menstrual cycle, otherwise no remarkable findings.  Deferred sending urine culture due to lack of urinary symptoms.  Suspect pain is likely muscular in nature.  Given IM Decadron  in clinic for acute pain.  Prescribed prednisone  burst for additional relief of inflammation related to pain.  Recommended taking Tylenol  as needed for breakthrough pain.  Also recommended taking and using previously prescribed tizanidine and lidocaine  patches for additional relief.  Given orthopedic follow-up.  Discussed follow-up and return precautions. Final Clinical Impressions(s) / UC Diagnoses   Final diagnoses:  Right flank pain  Spasm of thoracic back muscle     Discharge Instructions      As discussed stable your pain is likely muscular in nature. You were given an injection of Decadron  in clinic today to help with inflammation related to your pain. Tomorrow start taking 2 tablets of prednisone  once daily for 5 days for additional relief of this. While you are taking the prednisone  you can take 500 to 1000 mg of Tylenol  every 6-8 hours. Do not take ibuprofen  while taking the prednisone  as this can increase your risk of bleeding. You can also continue to take your previously prescribed tizanidine as needed for muscle pain and spasm. I also recommend trying your previously prescribed lidocaine  patches to this area for additional relief. Alternate between ice and  heat and do some gentle stretching to help with pain. Follow-up with [facility] if your pain continues for further evaluation.   ED Prescriptions     Medication Sig Dispense Auth. Provider   predniSONE  (DELTASONE ) 20 MG tablet Take 2 tablets (40 mg total) by mouth daily for 5 days. 10 tablet Johnie Flaming A, NP      PDMP not reviewed this encounter.    [1]  Social History Tobacco Use   Smoking status: Former    Current packs/day: 0.00    Types: Cigarettes    Quit date: 01/30/1994    Years since quitting: 30.5    Passive exposure: Past   Smokeless tobacco: Never  Vaping Use   Vaping status: Never Used  Substance Use Topics   Alcohol use: No    Comment: quit 1995   Drug use: No     Johnie Flaming LABOR, NP 07/24/24 1911

## 2024-07-24 NOTE — ED Triage Notes (Addendum)
 Right flank pain for 3 days.  Pain is intermittent.  Last BM was today.  Does have a history of constipation.  Denies burning with urination.  Currently on menstrual cycle  Patient reports having burning pain in top of thigh and saw her provider recently.  Unable to recall specifics of visit, but information should be in chart  Patient reports taking a tizanidine yesterday and the day prior, minimal relief or improvement

## 2024-07-24 NOTE — Discharge Instructions (Signed)
 As discussed stable your pain is likely muscular in nature. You were given an injection of Decadron  in clinic today to help with inflammation related to your pain. Tomorrow start taking 2 tablets of prednisone  once daily for 5 days for additional relief of this. While you are taking the prednisone  you can take 500 to 1000 mg of Tylenol  every 6-8 hours. Do not take ibuprofen  while taking the prednisone  as this can increase your risk of bleeding. You can also continue to take your previously prescribed tizanidine as needed for muscle pain and spasm. I also recommend trying your previously prescribed lidocaine  patches to this area for additional relief. Alternate between ice and heat and do some gentle stretching to help with pain. Follow-up with [facility] if your pain continues for further evaluation.

## 2024-08-23 ENCOUNTER — Ambulatory Visit: Attending: Family Medicine

## 2024-08-25 ENCOUNTER — Ambulatory Visit (HOSPITAL_BASED_OUTPATIENT_CLINIC_OR_DEPARTMENT_OTHER): Attending: Family Medicine | Admitting: Pulmonary Disease

## 2024-08-25 ENCOUNTER — Other Ambulatory Visit: Payer: Self-pay | Admitting: Family Medicine

## 2024-08-25 DIAGNOSIS — R0902 Hypoxemia: Secondary | ICD-10-CM | POA: Diagnosis not present

## 2024-08-25 DIAGNOSIS — G4719 Other hypersomnia: Secondary | ICD-10-CM | POA: Insufficient documentation

## 2024-08-25 DIAGNOSIS — G4733 Obstructive sleep apnea (adult) (pediatric): Secondary | ICD-10-CM | POA: Diagnosis not present

## 2024-08-27 ENCOUNTER — Ambulatory Visit (HOSPITAL_BASED_OUTPATIENT_CLINIC_OR_DEPARTMENT_OTHER): Admitting: Pulmonary Disease

## 2024-08-29 DIAGNOSIS — G4733 Obstructive sleep apnea (adult) (pediatric): Secondary | ICD-10-CM

## 2024-08-29 NOTE — Procedures (Signed)
 Adrienne Collins [facility] [address] [place], [place] [address] Tel: [phone]   Fax: [phone]  Polysomnography Interpretation  Patient Name:  Adrienne Collins, Adrienne Collins Date:  08/25/2024 Referring Physician:  LAYMON LEGIONS [phone])   Indications for Polysomnography The patient is a 54 year old Female who is 5' 1 and weighs 204.0 lbs. Her BMI equals 39.0.  A full night polysomnogram was performed to evaluate for -.  No Medications taken.  No Data.   Polysomnogram Data A full night polysomnogram recorded the standard physiologic parameters including EEG, EOG, EMG, EKG, nasal and oral airflow.  Respiratory parameters of chest and abdominal movements were recorded with Respiratory Inductance Plethysmography belts.  Oxygen saturation was recorded by pulse oximetry.   Sleep Architecture The total recording time of the polysomnogram was 404.6 minutes.  The total sleep time was 279.0 minutes.  The patient spent 8.2% of total sleep time in Stage N1, 72.4% in Stage N2, 5.0% in Stages N3, and 14.3% in REM.  Sleep latency was 56.7 minutes.  REM latency was 179.5 minutes.  Sleep Efficiency was 68.9%.  Wake after Sleep Onset time was 68.5 minutes.  Respiratory Events The polysomnogram revealed a presence of - obstructive, 4 centrals, and - mixed apneas resulting in an Apnea index of 0.9 events per hour.  There were 135 hypopneas (>=3% desaturation and/or arousal) resulting in an Apnea\Hypopnea Index (AHI >=3% desaturation and/or arousal) of 29.9 events per hour.  There were 95 hypopneas (>=4% desaturation) resulting in an Apnea\Hypopnea Index (AHI >=4% desaturation) of 21.3 events per hour.  There were 40 Respiratory Effort Related Arousals resulting in a RERA index of 8.6 events per hour. The Respiratory Disturbance Index is 38.5 events per hour.  The snore index was - events per hour.  Mean oxygen saturation was 93.3%.  The lowest oxygen saturation during sleep was 76.0%.   Time spent <=88% oxygen saturation was 4.3 minutes (1.1%).  Limb Activity There were - total limb movements recorded, of this total, - were classified as PLMs.  PLM index was - per hour and PLM associated with Arousals index was - per hour.  Cardiac Summary The average pulse rate was 62.5 bpm.  The minimum pulse rate was 50.0 bpm while the maximum pulse rate was 92.0 bpm.  Cardiac rhythm was normal/abnormal.  Comments:  Patient was scheduled for split-night study but split criteria was not met, study completed as a diagnostic polysomnogram, limited by decreased sleep efficiency, sleep fragmentation  Diagnosis:  Moderate obstructive sleep apnea with moderately severe oxygen desaturations AHI of 29.9 with O2 nadir of 76%, saturations below 88% for 4.3 minutes Poor sleep efficiency with increased sleep latency, increased wake after sleep onset, fragmented sleep No significant periodic limb movement Cardiac rhythm was sinus  Recommendations: Recommend CPAP therapy for moderate obstructive sleep apnea.  Auto-titrating CPAP with pressure settings of 5-15 should be appropriate, along with heated humidification using the patient's preferred mask. Avoid alcohol, sedatives and other CNS depressants that may worsen sleep apnea and disrupt normal sleep architecture. Sleep hygiene should be reviewed to assess factors that may improve sleep quality. Weight management and regular exercise should be initiated or continued  Schedule follow-up in the office 4 to 6 weeks after initiation of treatment  This study was personally reviewed and electronically signed by: Dr. Jennet Epley Accredited Board Certified in Sleep Medicine Date/Time:   08/29/24     Diagnostic PSG Report  Patient Name: Adrienne Collins Study Date: 08/25/2024  Date of Birth: Jan 01, 1971  Study Type: Split Night  Age: 50 year MRN #: [id number]  Sex: Female Interpreting Physician: NEDA HAMMOND, [id number]  Height: 5' 1 Referring  Physician: LAYMON LEGIONS [phone])  Weight: 204.0 lbs Recording Tech: Dewane Hacker CRT RPSGT RST  BMI: 39.0 Scoring Tech: Dewane Hacker CRT RPSGT RST  ESS: 7 Neck Size: 15.75   Study Overview  Lights Off: 10:32:34 PM  Count Index  Lights On: 05:17:13 AM Awakenings: 26 5.6  Time in Bed: 404.6 min. Arousals: 150 32.3  Total Sleep Time: 279.0 min. AHI (>=3% Desat and/or Ar.): 139 29.9   Sleep Efficiency: 68.9% AHI (>=4% Desat): 99 21.3   Sleep Latency: 56.7 min. Limb Movements: - -  Wake After Sleep Onset: 68.5 min. Snore: - -  REM Latency from Sleep Onset: 179.5 min. Desaturations: 160 34.4     Minimum SpO2 TST: 76.0%    Sleep Architecture  % of Time in Bed Stages Time (mins) % Sleep Time  Wake 126.0   Stage N1 23.0 8.2%  Stage N2 202.0 72.4%  Stage N3 14.0 5.0%  REM 40.0 14.3%   Arousal Summary   NREM REM Sleep Index  Respiratory Arousals 64 26 90 19.4  PLM Arousals - - - -  Isolated Limb Movement Arousals - - - -  Snore Arousals - - - -  Spontaneous Arousals 55 5 60 12.9  Total 119 31 150 32.3   Limb Movement Summary   Count Index  Isolated Limb Movements - -  Periodic Limb Movements (PLMs) - -  Total Limb Movements - -    Respiratory Summary   By Sleep Stage By Body Position Total   NREM REM Supine Non-Supine   Time (min) 239.0 40.0 - 279.0 279.0         Obstructive Apnea - - - - -  Mixed Apnea - - - - -  Central Apnea 1 3 - 4 4  Total Apneas 1 3 - 4 4  Total Apnea Index 0.3 4.5 - 0.9 0.9         Hypopneas (>=3% Desat and/or Ar.) 76 59 - 135 135  AHI (>=3% Desat and/or Ar.) 19.3 93.0 - 29.9 29.9         Hypopneas (>=4% Desat) 47 48 - 95 95  AHI (>=4% Desat) 12.1 76.5 - 21.3 21.3          RERAs 36 4 - 40 40  RERA Index 9.0 6.0 - 8.6 8.6         RDI 28.4 99.0 - 38.5 38.5    Respiratory Event Type Index  Central Apneas 0.9  Obstructive Apneas -  Mixed Apneas -  Central Hypopneas 0.4  Obstructive Hypopneas 28.8  Central Apnea + Hypopnea (CAHI)  1.3  Obstructive Apnea + Hypopnea (OAHI) 28.8   Respiratory Event Durations   Apnea Hypopnea   NREM REM NREM REM  Average (seconds) 14.4 11.2 19.3 20.4  Maximum (seconds) 14.4 12.9 33.2 45.8    Oxygen Saturation Summary   Wake NREM REM TST TIB  Average SpO2 (%) 94.8% 92.6% 92.4% 92.6% 93.3%  Minimum SpO2 (%) 89.0% 88.0% 76.0% 76.0% 76.0%  Maximum SpO2 (%) 98.0% 98.0% 97.0% 98.0% 98.0%   Oxygen Saturation Distribution  Range (%) Time in range (min) Time in range (%)  90.0 - 100.0 384.6 95.7%  80.0 - 90.0 17.2 4.3%  70.0 - 80.0 0.1 0.0%  60.0 - 70.0 - -  50.0 - 60.0 - -  0.0 - 50.0 - -  Time Spent <=88% SpO2  Range (%) Time in range (min) Time in range (%)  0.0 - 88.0 4.3 1.1%      Count Index  Desaturations 160 34.4    Cardiac Summary   Wake NREM REM Sleep Total  Average Pulse Rate (BPM) 66.9 59.8 66.7 60.8 62.5  Minimum Pulse Rate (BPM) 54.0 50.0 52.0 50.0 50.0  Maximum Pulse Rate (BPM) 92.0 80.0 86.0 86.0 92.0   Pulse Rate Distribution:  Range (bpm) Time in range (min) Time in range (%)  0.0 - 40.0 - -  40.0 - 60.0 165.8 41.2%  60.0 - 80.0 222.9 55.4%  80.0 - 100.0 2.8 0.7%  100.0 - 120.0 - -  120.0 - 140.0 - -  140.0 - 200.0 - -      Hypnograms                      Technologist Comments  THE 22-YEAR-OLD FEMALE PATIENT PRESENTED TO THE SLEEP DISORDER CENTER FOR A SPLIT NIGHT STUDY WITH A CHIEF COMPLAINT OF EXCESSIVE DAYTIME SLEEPINESS. THE SPLIT NIGHT STUDY WAS EXPLAINED, WITH ALL QUESTIONS ANSWERED. THE PATIENT WAS FITTED WITH A LARGE RESMED AIRFIT F40 FULL FACE MASK FOR PRE-CPAP TRIAL, WHICH WAS TOLERATED WELL. NO BEDTIME MEDICATIONS WERE SELF ADMINISTERED. THE LEAD PLACEMENT WAS INITIATED, THEN THE STUDY WAS BEGUN. MODERATE, INTERMITTENT AUDIBLE SNORING WAS NOTED THROUGHOUT THE STUDY. SUPPLEMENTAL OXYGEN WAS NOT WARRANTED DURING THE STUDY. NO PLMs - PLMAs WERE NOTED. NO RESTROOM VISITS WERE MADE. NO OBVIOUS CARDIAC  ARRHYTHMIAS WERE OBSERVED. NO OBVIOUS PARASOMNIAs WERE OBSERVED. SPLIT NIGHT CRITERIA WAS NOT MET DUE TO INSUFFICIENT AMOUNT OF SLEEP, THEREFORE A NPSG DIAGNOSTIC STUDY WAS CONTINUED. THE PATIENT TOLERATED THE NPSG STUDY WELL.

## 2024-08-29 NOTE — Procedures (Signed)
" °  Indications for Polysomnography The patient is a 54 year old Female who is 5' 1 and weighs 204.0 lbs. Her BMI equals 39.0.  A full night polysomnogram was performed to evaluate for -.  No Medications taken.No Data. Polysomnogram Data A full night polysomnogram recorded the standard physiologic parameters including EEG, EOG, EMG, EKG, nasal and oral airflow.  Respiratory parameters of chest and abdominal movements were recorded with Respiratory Inductance Plethysmography belts.   Oxygen saturation was recorded by pulse oximetry.  Sleep Architecture The total recording time of the polysomnogram was 404.6 minutes.  The total sleep time was 279.0 minutes.  The patient spent 8.2% of total sleep time in Stage N1, 72.4% in Stage N2, 5.0% in Stages N3, and 14.3% in REM.  Sleep latency was 56.7 minutes.   REM latency was 179.5 minutes.  Sleep Efficiency was 68.9%.  Wake after Sleep Onset time was 68.5 minutes.  Respiratory Events The polysomnogram revealed a presence of - obstructive, 4 centrals, and - mixed apneas resulting in an Apnea index of 0.9 events per hour.  There were 135 hypopneas (GreaterEqual to3% desaturation and/or arousal) resulting in an Apnea\Hypopnea Index (AHI  GreaterEqual to3% desaturation and/or arousal) of 29.9 events per hour.  There were 95 hypopneas (GreaterEqual to4% desaturation) resulting in an Apnea\Hypopnea Index (AHI GreaterEqual to4% desaturation) of 21.3 events per hour.  There were 40  Respiratory Effort Related Arousals resulting in a RERA index of 8.6 events per hour. The Respiratory Disturbance Index is 38.5 events per hour.  The snore index was - events per hour.  Mean oxygen saturation was 93.3%.  The lowest oxygen saturation during sleep was 76.0%.  Time spent LessEqual to88% oxygen saturation was  minutes ().  Limb Activity There were - total limb movements recorded, of this total, - were classified as PLMs.  PLM index was - per hour and PLM associated with  Arousals index was - per hour.  Cardiac Summary The average pulse rate was 62.5 bpm.  The minimum pulse rate was 50.0 bpm while the maximum pulse rate was 92.0 bpm.  Cardiac rhythm was normal/abnormal.  Comments: Patient was scheduled for split-night study but split criteria was not met, study completed as a diagnostic polysomnogram, limited by decreased sleep efficiency, sleep fragmentation  Diagnosis: Moderate obstructive sleep apnea with moderately severe oxygen desaturations AHI of 29.9 with O2 nadir of 76%, saturations below 88% for 4.3 minutes Poor sleep efficiency with increased sleep latency, increased wake after sleep onset, fragmented sleep No significant periodic limb movement Cardiac rhythm was sinus  Recommendations: Recommend CPAP therapy for moderate obstructive sleep apnea.  Auto-titrating CPAP with pressure settings of 5-15 should be appropriate, along with heated humidification using the patient's preferred mask. Avoid alcohol, sedatives and other CNS depressants that may worsen sleep apnea and disrupt normal sleep architecture. Sleep hygiene should be reviewed to assess factors that may improve sleep quality. Weight management and regular exercise should be initiated or continued  Schedule follow-up in the office 4 to 6 weeks after initiation of treatment  This study was personally reviewed and electronically signed by: Dr. Jennet Epley Accredited Board Certified in Sleep Medicine Date/Time:  08/29/24 "

## 2024-09-14 ENCOUNTER — Ambulatory Visit: Admitting: Obstetrics and Gynecology

## 2024-09-14 NOTE — Telephone Encounter (Signed)
 Community message sent to [employer]. Will await response.   Veronda Prude, RN

## 2024-09-14 NOTE — Telephone Encounter (Signed)
 Receipt confirmed by [employer].   Chiquita JAYSON English, RN

## 2024-09-28 ENCOUNTER — Ambulatory Visit: Admitting: Family Medicine
